# Patient Record
Sex: Female | Born: 1935 | Race: White | Hispanic: No | Marital: Married | Smoking: Former smoker
Health system: Southern US, Community
[De-identification: ages and names within clinical notes are randomized; demographics above are authoritative.]

## PROBLEM LIST (undated history)

## (undated) DIAGNOSIS — I5042 Chronic combined systolic (congestive) and diastolic (congestive) heart failure: Secondary | ICD-10-CM

## (undated) DIAGNOSIS — A419 Sepsis, unspecified organism: Secondary | ICD-10-CM

## (undated) DIAGNOSIS — I10 Essential (primary) hypertension: Secondary | ICD-10-CM

## (undated) DIAGNOSIS — R9439 Abnormal result of other cardiovascular function study: Secondary | ICD-10-CM

## (undated) DIAGNOSIS — J181 Lobar pneumonia, unspecified organism: Secondary | ICD-10-CM

## (undated) DIAGNOSIS — F17200 Nicotine dependence, unspecified, uncomplicated: Secondary | ICD-10-CM

## (undated) DIAGNOSIS — I5041 Acute combined systolic (congestive) and diastolic (congestive) heart failure: Secondary | ICD-10-CM

## (undated) DIAGNOSIS — I456 Pre-excitation syndrome: Secondary | ICD-10-CM

## (undated) DIAGNOSIS — G2581 Restless legs syndrome: Secondary | ICD-10-CM

## (undated) DIAGNOSIS — J449 Chronic obstructive pulmonary disease, unspecified: Secondary | ICD-10-CM

## (undated) HISTORY — PX: APPENDECTOMY: SHX54

## (undated) HISTORY — PX: HEMANGIOMA EXCISION: SHX1734

## (undated) HISTORY — PX: KIDNEY SURGERY: SHX687

## (undated) HISTORY — DX: Abnormal result of other cardiovascular function study: R94.39

## (undated) HISTORY — DX: Lobar pneumonia, unspecified organism: J18.1

## (undated) HISTORY — PX: NECK SURGERY: SHX720

## (undated) HISTORY — PX: TONSILLECTOMY: SUR1361

## (undated) HISTORY — DX: Sepsis, unspecified organism: A41.9

## (undated) HISTORY — DX: Acute combined systolic (congestive) and diastolic (congestive) heart failure: I50.41

## (undated) HISTORY — PX: BACK SURGERY: SHX140

---

## 1997-12-01 ENCOUNTER — Other Ambulatory Visit: Admission: RE | Admit: 1997-12-01 | Discharge: 1997-12-01 | Payer: Self-pay | Admitting: Internal Medicine

## 1997-12-30 ENCOUNTER — Other Ambulatory Visit: Admission: RE | Admit: 1997-12-30 | Discharge: 1997-12-30 | Payer: Self-pay | Admitting: Gynecology

## 1998-04-09 ENCOUNTER — Emergency Department (HOSPITAL_COMMUNITY): Admission: EM | Admit: 1998-04-09 | Discharge: 1998-04-09 | Payer: Self-pay | Admitting: Emergency Medicine

## 1998-07-30 ENCOUNTER — Ambulatory Visit (HOSPITAL_COMMUNITY): Admission: RE | Admit: 1998-07-30 | Discharge: 1998-07-30 | Payer: Self-pay | Admitting: Gastroenterology

## 1999-02-03 ENCOUNTER — Other Ambulatory Visit: Admission: RE | Admit: 1999-02-03 | Discharge: 1999-02-03 | Payer: Self-pay | Admitting: Gynecology

## 1999-07-19 ENCOUNTER — Ambulatory Visit (HOSPITAL_COMMUNITY): Admission: RE | Admit: 1999-07-19 | Discharge: 1999-07-19 | Payer: Self-pay | Admitting: Gastroenterology

## 2000-01-12 ENCOUNTER — Encounter: Payer: Self-pay | Admitting: Internal Medicine

## 2000-01-12 ENCOUNTER — Encounter: Admission: RE | Admit: 2000-01-12 | Discharge: 2000-01-12 | Payer: Self-pay | Admitting: Internal Medicine

## 2000-01-17 ENCOUNTER — Encounter: Admission: RE | Admit: 2000-01-17 | Discharge: 2000-01-17 | Payer: Self-pay | Admitting: Internal Medicine

## 2000-01-17 ENCOUNTER — Encounter: Payer: Self-pay | Admitting: Internal Medicine

## 2000-03-02 ENCOUNTER — Other Ambulatory Visit: Admission: RE | Admit: 2000-03-02 | Discharge: 2000-03-02 | Payer: Self-pay | Admitting: Gynecology

## 2000-03-13 ENCOUNTER — Encounter: Payer: Self-pay | Admitting: Internal Medicine

## 2000-03-13 ENCOUNTER — Encounter: Admission: RE | Admit: 2000-03-13 | Discharge: 2000-03-13 | Payer: Self-pay | Admitting: Internal Medicine

## 2000-12-27 ENCOUNTER — Encounter: Payer: Self-pay | Admitting: Internal Medicine

## 2000-12-27 ENCOUNTER — Ambulatory Visit (HOSPITAL_COMMUNITY): Admission: RE | Admit: 2000-12-27 | Discharge: 2000-12-27 | Payer: Self-pay | Admitting: Internal Medicine

## 2001-01-03 ENCOUNTER — Encounter: Payer: Self-pay | Admitting: Specialist

## 2001-01-03 ENCOUNTER — Ambulatory Visit (HOSPITAL_COMMUNITY): Admission: RE | Admit: 2001-01-03 | Discharge: 2001-01-03 | Payer: Self-pay | Admitting: Specialist

## 2001-02-02 ENCOUNTER — Encounter: Payer: Self-pay | Admitting: Neurosurgery

## 2001-02-06 ENCOUNTER — Encounter: Payer: Self-pay | Admitting: Neurosurgery

## 2001-02-06 ENCOUNTER — Inpatient Hospital Stay (HOSPITAL_COMMUNITY): Admission: RE | Admit: 2001-02-06 | Discharge: 2001-02-09 | Payer: Self-pay | Admitting: Neurosurgery

## 2001-02-21 ENCOUNTER — Encounter: Admission: RE | Admit: 2001-02-21 | Discharge: 2001-02-21 | Payer: Self-pay | Admitting: Neurosurgery

## 2001-02-21 ENCOUNTER — Encounter: Payer: Self-pay | Admitting: Neurosurgery

## 2001-03-06 ENCOUNTER — Encounter: Payer: Self-pay | Admitting: Internal Medicine

## 2001-03-06 ENCOUNTER — Encounter: Admission: RE | Admit: 2001-03-06 | Discharge: 2001-03-06 | Payer: Self-pay | Admitting: Internal Medicine

## 2001-04-04 ENCOUNTER — Encounter: Admission: RE | Admit: 2001-04-04 | Discharge: 2001-04-04 | Payer: Self-pay | Admitting: Neurosurgery

## 2001-04-04 ENCOUNTER — Encounter: Payer: Self-pay | Admitting: Neurosurgery

## 2001-04-05 ENCOUNTER — Other Ambulatory Visit: Admission: RE | Admit: 2001-04-05 | Discharge: 2001-04-05 | Payer: Self-pay | Admitting: Gynecology

## 2001-05-17 ENCOUNTER — Encounter: Payer: Self-pay | Admitting: Neurosurgery

## 2001-05-17 ENCOUNTER — Encounter: Admission: RE | Admit: 2001-05-17 | Discharge: 2001-05-17 | Payer: Self-pay | Admitting: Neurosurgery

## 2001-05-25 ENCOUNTER — Encounter: Payer: Self-pay | Admitting: Emergency Medicine

## 2001-05-25 ENCOUNTER — Emergency Department (HOSPITAL_COMMUNITY): Admission: EM | Admit: 2001-05-25 | Discharge: 2001-05-25 | Payer: Self-pay | Admitting: Emergency Medicine

## 2001-05-31 ENCOUNTER — Encounter: Payer: Self-pay | Admitting: Neurosurgery

## 2001-05-31 ENCOUNTER — Encounter: Admission: RE | Admit: 2001-05-31 | Discharge: 2001-05-31 | Payer: Self-pay | Admitting: Neurosurgery

## 2001-06-14 ENCOUNTER — Encounter: Payer: Self-pay | Admitting: Neurosurgery

## 2001-06-14 ENCOUNTER — Encounter: Admission: RE | Admit: 2001-06-14 | Discharge: 2001-06-14 | Payer: Self-pay | Admitting: Neurosurgery

## 2001-06-24 ENCOUNTER — Emergency Department (HOSPITAL_COMMUNITY): Admission: EM | Admit: 2001-06-24 | Discharge: 2001-06-24 | Payer: Self-pay | Admitting: Emergency Medicine

## 2001-09-10 ENCOUNTER — Ambulatory Visit (HOSPITAL_COMMUNITY): Admission: RE | Admit: 2001-09-10 | Discharge: 2001-09-10 | Payer: Self-pay | Admitting: Neurosurgery

## 2001-09-10 ENCOUNTER — Encounter: Payer: Self-pay | Admitting: Neurosurgery

## 2001-11-07 ENCOUNTER — Inpatient Hospital Stay (HOSPITAL_COMMUNITY): Admission: EM | Admit: 2001-11-07 | Discharge: 2001-11-09 | Payer: Self-pay | Admitting: Specialist

## 2001-11-19 ENCOUNTER — Encounter (INDEPENDENT_AMBULATORY_CARE_PROVIDER_SITE_OTHER): Payer: Self-pay | Admitting: Specialist

## 2001-11-19 ENCOUNTER — Emergency Department (HOSPITAL_COMMUNITY): Admission: EM | Admit: 2001-11-19 | Discharge: 2001-11-19 | Payer: Self-pay | Admitting: Emergency Medicine

## 2002-03-08 ENCOUNTER — Encounter: Admission: RE | Admit: 2002-03-08 | Discharge: 2002-03-08 | Payer: Self-pay | Admitting: Gynecology

## 2002-03-08 ENCOUNTER — Encounter: Payer: Self-pay | Admitting: Gynecology

## 2002-04-10 ENCOUNTER — Other Ambulatory Visit: Admission: RE | Admit: 2002-04-10 | Discharge: 2002-04-10 | Payer: Self-pay | Admitting: Gynecology

## 2002-08-06 ENCOUNTER — Encounter: Admission: RE | Admit: 2002-08-06 | Discharge: 2002-08-06 | Payer: Self-pay | Admitting: Internal Medicine

## 2002-08-06 ENCOUNTER — Encounter: Payer: Self-pay | Admitting: Internal Medicine

## 2002-08-14 ENCOUNTER — Encounter: Admission: RE | Admit: 2002-08-14 | Discharge: 2002-08-14 | Payer: Self-pay | Admitting: Neurosurgery

## 2002-08-14 ENCOUNTER — Encounter: Payer: Self-pay | Admitting: Neurosurgery

## 2002-11-06 ENCOUNTER — Encounter: Payer: Self-pay | Admitting: Neurosurgery

## 2002-11-06 ENCOUNTER — Ambulatory Visit (HOSPITAL_COMMUNITY): Admission: RE | Admit: 2002-11-06 | Discharge: 2002-11-06 | Payer: Self-pay | Admitting: Neurosurgery

## 2003-03-27 ENCOUNTER — Encounter: Payer: Self-pay | Admitting: Gynecology

## 2003-03-27 ENCOUNTER — Encounter: Admission: RE | Admit: 2003-03-27 | Discharge: 2003-03-27 | Payer: Self-pay | Admitting: Gynecology

## 2003-04-21 ENCOUNTER — Encounter: Admission: RE | Admit: 2003-04-21 | Discharge: 2003-04-21 | Payer: Self-pay | Admitting: Specialist

## 2003-04-21 ENCOUNTER — Encounter: Payer: Self-pay | Admitting: Radiology

## 2003-04-21 ENCOUNTER — Encounter: Payer: Self-pay | Admitting: Specialist

## 2003-05-09 ENCOUNTER — Encounter: Admission: RE | Admit: 2003-05-09 | Discharge: 2003-05-09 | Payer: Self-pay | Admitting: Specialist

## 2003-05-09 ENCOUNTER — Encounter: Payer: Self-pay | Admitting: Specialist

## 2003-06-03 ENCOUNTER — Encounter: Payer: Self-pay | Admitting: Specialist

## 2003-06-03 ENCOUNTER — Ambulatory Visit (HOSPITAL_COMMUNITY): Admission: RE | Admit: 2003-06-03 | Discharge: 2003-06-03 | Payer: Self-pay | Admitting: Specialist

## 2003-06-13 ENCOUNTER — Encounter: Payer: Self-pay | Admitting: Specialist

## 2003-06-13 ENCOUNTER — Encounter: Admission: RE | Admit: 2003-06-13 | Discharge: 2003-06-13 | Payer: Self-pay | Admitting: Specialist

## 2003-07-11 ENCOUNTER — Inpatient Hospital Stay (HOSPITAL_COMMUNITY): Admission: RE | Admit: 2003-07-11 | Discharge: 2003-07-13 | Payer: Self-pay | Admitting: Specialist

## 2004-04-02 ENCOUNTER — Encounter: Admission: RE | Admit: 2004-04-02 | Discharge: 2004-04-02 | Payer: Self-pay | Admitting: Specialist

## 2004-04-08 ENCOUNTER — Encounter: Admission: RE | Admit: 2004-04-08 | Discharge: 2004-04-08 | Payer: Self-pay | Admitting: Gynecology

## 2004-04-19 ENCOUNTER — Other Ambulatory Visit: Admission: RE | Admit: 2004-04-19 | Discharge: 2004-04-19 | Payer: Self-pay | Admitting: Gynecology

## 2004-04-29 ENCOUNTER — Encounter: Admission: RE | Admit: 2004-04-29 | Discharge: 2004-04-29 | Payer: Self-pay | Admitting: Specialist

## 2004-05-17 ENCOUNTER — Encounter: Admission: RE | Admit: 2004-05-17 | Discharge: 2004-05-17 | Payer: Self-pay | Admitting: Specialist

## 2004-05-31 ENCOUNTER — Encounter: Admission: RE | Admit: 2004-05-31 | Discharge: 2004-05-31 | Payer: Self-pay | Admitting: Specialist

## 2004-09-30 ENCOUNTER — Inpatient Hospital Stay (HOSPITAL_COMMUNITY): Admission: RE | Admit: 2004-09-30 | Discharge: 2004-10-01 | Payer: Self-pay | Admitting: Specialist

## 2004-10-26 ENCOUNTER — Emergency Department (HOSPITAL_COMMUNITY): Admission: EM | Admit: 2004-10-26 | Discharge: 2004-10-26 | Payer: Self-pay | Admitting: Emergency Medicine

## 2005-01-31 ENCOUNTER — Encounter: Admission: RE | Admit: 2005-01-31 | Discharge: 2005-01-31 | Payer: Self-pay | Admitting: Gastroenterology

## 2005-04-13 ENCOUNTER — Encounter: Admission: RE | Admit: 2005-04-13 | Discharge: 2005-04-13 | Payer: Self-pay | Admitting: Gynecology

## 2005-05-06 ENCOUNTER — Ambulatory Visit: Payer: Self-pay | Admitting: Pain Medicine

## 2005-05-13 ENCOUNTER — Ambulatory Visit: Payer: Self-pay | Admitting: Pain Medicine

## 2005-06-01 ENCOUNTER — Ambulatory Visit: Payer: Self-pay | Admitting: Pain Medicine

## 2005-06-07 ENCOUNTER — Ambulatory Visit: Payer: Self-pay | Admitting: Pain Medicine

## 2005-06-20 ENCOUNTER — Ambulatory Visit: Payer: Self-pay | Admitting: Physician Assistant

## 2005-06-28 ENCOUNTER — Ambulatory Visit: Payer: Self-pay | Admitting: Pain Medicine

## 2005-07-12 ENCOUNTER — Ambulatory Visit: Payer: Self-pay | Admitting: Physician Assistant

## 2005-08-03 ENCOUNTER — Ambulatory Visit: Payer: Self-pay | Admitting: Pain Medicine

## 2005-08-31 ENCOUNTER — Ambulatory Visit: Payer: Self-pay | Admitting: Pain Medicine

## 2005-09-02 ENCOUNTER — Ambulatory Visit: Payer: Self-pay | Admitting: Pain Medicine

## 2005-10-17 ENCOUNTER — Ambulatory Visit: Payer: Self-pay | Admitting: Pain Medicine

## 2005-10-17 ENCOUNTER — Inpatient Hospital Stay: Payer: Self-pay | Admitting: Pain Medicine

## 2005-11-07 ENCOUNTER — Ambulatory Visit: Payer: Self-pay | Admitting: Pain Medicine

## 2005-12-07 ENCOUNTER — Ambulatory Visit: Payer: Self-pay | Admitting: Pain Medicine

## 2006-01-04 ENCOUNTER — Ambulatory Visit: Payer: Self-pay | Admitting: Pain Medicine

## 2006-01-25 ENCOUNTER — Ambulatory Visit: Payer: Self-pay | Admitting: Pain Medicine

## 2006-01-31 ENCOUNTER — Ambulatory Visit: Payer: Self-pay | Admitting: Pain Medicine

## 2006-02-15 ENCOUNTER — Ambulatory Visit: Payer: Self-pay | Admitting: Physician Assistant

## 2006-03-30 ENCOUNTER — Ambulatory Visit: Payer: Self-pay | Admitting: Physician Assistant

## 2006-04-06 ENCOUNTER — Ambulatory Visit: Payer: Self-pay | Admitting: Pain Medicine

## 2006-04-24 ENCOUNTER — Ambulatory Visit: Payer: Self-pay | Admitting: Pain Medicine

## 2006-05-08 ENCOUNTER — Ambulatory Visit: Payer: Self-pay | Admitting: Pain Medicine

## 2006-05-10 ENCOUNTER — Encounter: Admission: RE | Admit: 2006-05-10 | Discharge: 2006-05-10 | Payer: Self-pay | Admitting: Gynecology

## 2006-06-05 ENCOUNTER — Ambulatory Visit: Payer: Self-pay | Admitting: Physician Assistant

## 2006-07-13 ENCOUNTER — Other Ambulatory Visit: Admission: RE | Admit: 2006-07-13 | Discharge: 2006-07-13 | Payer: Self-pay | Admitting: Gynecology

## 2006-08-16 ENCOUNTER — Ambulatory Visit: Payer: Self-pay | Admitting: Pain Medicine

## 2006-09-07 ENCOUNTER — Ambulatory Visit: Payer: Self-pay | Admitting: Pain Medicine

## 2006-09-20 ENCOUNTER — Ambulatory Visit: Payer: Self-pay | Admitting: Physician Assistant

## 2006-10-09 ENCOUNTER — Ambulatory Visit: Payer: Self-pay | Admitting: Pain Medicine

## 2006-10-24 ENCOUNTER — Ambulatory Visit: Payer: Self-pay | Admitting: Pain Medicine

## 2006-11-20 ENCOUNTER — Ambulatory Visit: Payer: Self-pay | Admitting: Physician Assistant

## 2006-11-30 ENCOUNTER — Ambulatory Visit: Payer: Self-pay | Admitting: Pain Medicine

## 2006-12-14 ENCOUNTER — Ambulatory Visit: Payer: Self-pay | Admitting: Physician Assistant

## 2007-01-09 ENCOUNTER — Ambulatory Visit: Payer: Self-pay | Admitting: Physician Assistant

## 2007-01-11 ENCOUNTER — Ambulatory Visit: Payer: Self-pay | Admitting: Pain Medicine

## 2007-01-25 ENCOUNTER — Ambulatory Visit: Payer: Self-pay | Admitting: Physician Assistant

## 2007-02-01 ENCOUNTER — Ambulatory Visit: Payer: Self-pay | Admitting: Pain Medicine

## 2007-02-14 ENCOUNTER — Encounter: Admission: RE | Admit: 2007-02-14 | Discharge: 2007-02-14 | Payer: Self-pay | Admitting: Internal Medicine

## 2007-02-14 ENCOUNTER — Encounter: Admission: RE | Admit: 2007-02-14 | Discharge: 2007-02-14 | Payer: Self-pay | Admitting: Neurosurgery

## 2007-03-06 ENCOUNTER — Ambulatory Visit: Payer: Self-pay | Admitting: Physician Assistant

## 2007-04-03 ENCOUNTER — Ambulatory Visit: Payer: Self-pay | Admitting: Physician Assistant

## 2007-04-13 ENCOUNTER — Ambulatory Visit: Payer: Self-pay | Admitting: Physician Assistant

## 2007-04-19 ENCOUNTER — Ambulatory Visit: Payer: Self-pay | Admitting: Pain Medicine

## 2007-05-03 ENCOUNTER — Ambulatory Visit: Payer: Self-pay | Admitting: Physician Assistant

## 2007-05-16 ENCOUNTER — Ambulatory Visit: Payer: Self-pay | Admitting: Physician Assistant

## 2007-06-14 ENCOUNTER — Encounter: Admission: RE | Admit: 2007-06-14 | Discharge: 2007-06-14 | Payer: Self-pay | Admitting: Gynecology

## 2007-06-28 ENCOUNTER — Ambulatory Visit: Payer: Self-pay | Admitting: Physician Assistant

## 2007-07-18 ENCOUNTER — Ambulatory Visit: Payer: Self-pay | Admitting: Pain Medicine

## 2007-09-19 ENCOUNTER — Ambulatory Visit (HOSPITAL_COMMUNITY): Admission: RE | Admit: 2007-09-19 | Discharge: 2007-09-20 | Payer: Self-pay | Admitting: Orthopedic Surgery

## 2007-12-06 ENCOUNTER — Encounter: Admission: RE | Admit: 2007-12-06 | Discharge: 2007-12-06 | Payer: Self-pay | Admitting: Gastroenterology

## 2008-05-06 ENCOUNTER — Encounter: Admission: RE | Admit: 2008-05-06 | Discharge: 2008-05-06 | Payer: Self-pay | Admitting: Gastroenterology

## 2008-06-18 ENCOUNTER — Encounter: Admission: RE | Admit: 2008-06-18 | Discharge: 2008-06-18 | Payer: Self-pay | Admitting: Gynecology

## 2008-07-16 ENCOUNTER — Ambulatory Visit (HOSPITAL_COMMUNITY): Admission: RE | Admit: 2008-07-16 | Discharge: 2008-07-17 | Payer: Self-pay | Admitting: Orthopedic Surgery

## 2009-04-23 ENCOUNTER — Encounter: Admission: RE | Admit: 2009-04-23 | Discharge: 2009-04-23 | Payer: Self-pay | Admitting: Internal Medicine

## 2009-05-20 ENCOUNTER — Emergency Department (HOSPITAL_COMMUNITY): Admission: EM | Admit: 2009-05-20 | Discharge: 2009-05-20 | Payer: Self-pay | Admitting: Emergency Medicine

## 2009-07-03 ENCOUNTER — Encounter: Admission: RE | Admit: 2009-07-03 | Discharge: 2009-07-03 | Payer: Self-pay | Admitting: Gynecology

## 2009-07-14 ENCOUNTER — Encounter: Admission: RE | Admit: 2009-07-14 | Discharge: 2009-07-14 | Payer: Self-pay | Admitting: Gynecology

## 2009-09-14 ENCOUNTER — Encounter: Admission: RE | Admit: 2009-09-14 | Discharge: 2009-09-14 | Payer: Self-pay | Admitting: Gastroenterology

## 2010-05-18 ENCOUNTER — Encounter: Admission: RE | Admit: 2010-05-18 | Discharge: 2010-05-18 | Payer: Self-pay | Admitting: Internal Medicine

## 2010-07-07 ENCOUNTER — Encounter: Admission: RE | Admit: 2010-07-07 | Discharge: 2010-07-07 | Payer: Self-pay | Admitting: Gynecology

## 2010-07-13 ENCOUNTER — Encounter: Admission: RE | Admit: 2010-07-13 | Discharge: 2010-07-13 | Payer: Self-pay | Admitting: Neurology

## 2010-09-19 ENCOUNTER — Encounter: Payer: Self-pay | Admitting: Gynecology

## 2011-01-11 NOTE — Discharge Summary (Signed)
NAMEDESHANTA, [name]                ACCOUNT NO.:  [id number]   MEDICAL RECORD NO.:  [id number]          PATIENT TYPE:  OIB   LOCATION:  5008                         FACILITY:  [facility]   PHYSICIAN:  Alvy Beal, MD    DATE OF BIRTH:  05/06/1929   DATE OF ADMISSION:  09/19/2007  DATE OF DISCHARGE:  09/20/2007                               DISCHARGE SUMMARY   ADMISSION DIAGNOSES:  1. Lumbar degenerative disc disease.  2. Chronic low back pain.   DISCHARGE DIAGNOSES:  1. Lumbar degenerative disc disease.  2. Chronic low back pain.   SERVICE:  Dr. Shon Baton, [facility].   PROCEDURE:  Spinal cord stimulator placement.  This is an outpatient  procedure with extended recovery.   HISTORY:  [name] is a very pleasant 77 year old [name] who has been  having significant back pain and bilateral lower extremity.  [name] has  been treated in our office by Dr. Ethelene Hal for chronic pain and as a result  had a spinal cord stimulator trial which vastly improved her pain  approximately 75%.  Based on this improvement, [name] opted to have a  spinal cord stimulator placed.   PAST MEDICAL HISTORY:  1. Restless leg syndrome.  2. Lumbar degenerative disc disease.  3. Hypertension.  4. COPD.  5. Chronic pain syndrome.  6. Gastroesophageal reflux.  7. Anxiety.  8. SEASONAL ALLERGIES.  9. Hyperlipidemia.  10.Irritable bowel syndrome.   PHYSICAL EXAM IN THE OFFICE:  [name] is a very pleasant [name] who appears  younger than her stated age.  [name] is alert and oriented x3.  [name] had no  localizing motor or sensory deficit.  [name] had significant back pain with  palpitation and range of motion, no isolated hip, knee or ankle pain  with joint range of motion.  [name] did not have a foot drop and [name] had no  neurological deficits.   HOSPITAL COURSE:  Patient arrived at the hospital, was consented to the  procedure of the spinal cord stimulator placement.  Risk and benefits of  the procedure were again gone  over with the patient and consent was  obtained.  The patient was taken back to the operating room. [name]  tolerated the procedure very well, was transferred from the operating  room to the PACU in stable condition and was transferred from the PACU  to the Ortho Floor again in stable condition.  Patient had no  complications during her stay and the morning after surgery [name] was  ambulating on her own, [name] was voiding on her own and [name] was tolerating  a regular diet.  [name] is being discharged home today in stable condition.   DISCHARGE MEDICATIONS:  Include the medications on her Med Rec Form  which was:  1. Hydrochlorothiazide/triamterene.  2. Protonix.  3. Mirapex.  4. Sinemet.  5. Vytorin.  6. Ativan.  7. MiraLax.  8. Estradiol.  9. Vitamin D.  10.Citracal.  11.[name] is being discharged on a new medication of Percocet 10/325.  12.[name] is to discontinue her home medication of Percocet 5/325.  13.[name] is also given a  prescription for Robaxin 500 mg.   DISCHARGE INSTRUCTIONS:  1. Activity as tolerated.  2. Patient was allowed to shower postoperatively day 4.  [name] is to      avoid any bath or soaks.  3. [name] can apply ice to the incision sites for pain relief.  4. [name] is to follow up in our office in approximately 7-10 days for a      suture removal and wound check and at that time we will have her      spinal cord stimulator turned on and adjusted.      Crissie Reese, PA      Alvy Beal, MD  Electronically Signed    AC/MEDQ  D:  09/20/2007  T:  09/20/2007  Job:  [id number]

## 2011-01-11 NOTE — Op Note (Signed)
[name], [name]                ACCOUNT NO.:  [id number]   MEDICAL RECORD NO.:  [id number]          PATIENT TYPE:  OIB   LOCATION:  5014                         FACILITY:  [facility]   PHYSICIAN:  Alvy Beal, MD    DATE OF BIRTH:  11-May-1929   DATE OF PROCEDURE:  DATE OF DISCHARGE:                               OPERATIVE REPORT   Operative report  Pre-Op diagnosis: symptomatic hardware  Post-operative DX: same  Complications: none  EBL: minimum  HX:  She presents because of symptomatic hardware.  Several months ago  she had a spinal cord stimulator placed for chronic lower extremity  pain.  She did well initially with a spinal cord stimulator, stop being  effective.  She was still having significant pain at the battery site.  It was irritating her whenever she wear clothes and causing her  significant pain.  As a result she presented to have the battery  removed.  After discussing all appropriate risks, benefits, and  alternatives to surgery, she consented for removal of the symptomatic  hardware.   OPERATIVE NOTE:  The patient was brought to the operating room, placed  supine on the operating table.  After successful induction of general  anesthesia and endotracheal intubation, TEDs and SCDs were applied.  She  was turned prone onto a Wilson frame.  All bony prominences well-padded  and the back was prepped and draped in standard fashion.  A 0.25%  Sensorcaine mixed with epinephrine was used to anesthetize the skin.  An  incision was made through the previous incision where the battery was  placed.  Sharp dissection was carried out down to the battery.  It was  then delivered through the wound onto the skin.  The leads were drawn  somewhat tight and then cut.  The wound was then irrigated copiously  with normal saline.  The deep subcutaneous tissue was closed with  interrupted 2-0 Vicryl sutures and the skin with 3-0 Monocryl.  Steri-  Strips, dry dressing were applied.  The  battery was cleaned from the  patient to have later on.  At the end of the case, all needle and sponge  counts were correct.  The patient was ultimately transferred to PACU  without incident.  At the end of the case all needle and spomgue counts were correct.      Alvy Beal, MD  Electronically Signed     DDB/MEDQ  D:  07/16/2008  T:  07/17/2008  Job:  [id number]

## 2011-01-11 NOTE — Op Note (Signed)
NAMEKATRINA, [name]                ACCOUNT NO.:  [id number]   MEDICAL RECORD NO.:  [id number]          PATIENT TYPE:  OIB   LOCATION:  2550                         FACILITY:  [facility]   PHYSICIAN:  Alvy Beal, MD    DATE OF BIRTH:  Jan 27, 1929   DATE OF PROCEDURE:  09/19/2007  DATE OF DISCHARGE:                               OPERATIVE REPORT   PREOPERATIVE DIAGNOSIS:  Chronic back pain, status post back surgery.   POSTOPERATIVE DIAGNOSIS:  Chronic back pain, status post back surgery.   OPERATIVE PROCEDURE:  Spinal cord stimulator implantation.   COMPLICATIONS:  None.   CONDITION:  Stable.   HISTORY:  The patient is a very pleasant 77 year old woman who presents  with significant low back pain after having a decompressive procedure in  the past.  She had trial stimulator placed by my partner Dr. Ethelene Hal and  did very well.  Because of the improved pain and overall function, she  elected to proceed with permanent implantation.  All appropriate risks,  benefits, and alternatives to surgery were discussed and consent was  obtained.   FIRST ASSISTANT:  Crissie Reese, PA.   OPERATIVE NOTE:  The patient was brought to the operating room and  placed supine on the operating table.  After successful induction of  general anesthesia and endotracheal intubation, TED hose and SCDs were  applied.  The patient was turned prone onto a Wilson frame and the arms  were placed overhead.  All bony prominences were well-padded and the  back was prepped and draped in the standard fashion.   Using lateral fluoroscopy, I was then able to count from L5 up to T10.  A midline thoracic incision was made to expose a portion of T9, all of  T10, and a portion of T11 spinous processes.  Sharp dissection was  carried out down through the deep fascia and then a Cobb elevator was  used to strip the paraspinal muscles off the spinous processes and  laminae.  This was done bilaterally.  Once the posterior  spine was  exposed, I then used lateral fluoroscopy to again confirm the T10  spinous process.  Once confirmed, I resected with a Leksell rongeur and  then used a 3 mm Kerrison to perform a generous laminotomy of T10.  Using a fine curette, we developed a plane underneath the ligamentum  flavum and resected the ligamentum flavum to expose the underlying  thecal sac.  At this point with the anterior thecal sac well-exposed, I  then used a dural elevator in order to palpate up underneath the T9  spinous process.  This was confirmed.  Once I had an adequate plane, I  took the permanent implant and advanced it.  It went without tension or  pressure at this point confirmed in the AP and lateral planes that it  was properly positioned with fluoroscopy.  Once confirmed, I then  stitched it directly to bone with a FiberWire stitch and then intercross  wove through the T10-T11 interspinous process ligament.  At this point,  I had adequate stabilization of the paddle to  prevent migration.  I then  tested the battery to ensure it was working correctly.  I then made a  second counter incision on the left flank at the left buttock region and  created a pocket approximately 2.5 cm in depth from the skin surface.  I  then used a trocar and advanced it from the thoracic site to the lower  buttock incision.  I then advanced the leads through this and connected  it to the battery and buried the battery in the pocket and stitched it  with 2-0 Ethibond.  I then irrigated all the wounds and closed the deep  fascia on both wounds with interrupted #1 Vicryl  sutures, superficial with 2-0 Vicryl sutures, and 3-0 Monocryl for the  skin.  Steri-Strips and a dry dressing were applied.  The patient was  extubated and transferred to the [facility] unit without incident.  At the end  of the case, all needle, instrument, and sponge counts were correct.      Alvy Beal, MD  Electronically Signed     DDB/MEDQ  D:   09/19/2007  T:  09/19/2007  Job:  [id number]

## 2011-01-14 NOTE — Op Note (Signed)
[place]  Patient:    [name], [name]                         MRN: [id number] Proc. Date: 02/06/01 Attending:  Payton Doughty, M.D.                           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  OPERATION:  C4-5, C5-6, and C6-7 anterior cervical diskectomy and fusion with a tether plate.  SURGEON:   Payton Doughty, M.D.  ASSISTANT:  Mena Goes. Franky Macho, M.D.  SERVICE:  Neurosurgery.  ANESTHESIA:  General endotracheal anesthesia.  PREP:    In sterile manner and scrubbed with alcohol wipe.  COMPLICATIONS:  None.  INDICATIONS:  A 77 year old right-handed white lady with severe spondylitic myelopathy at 4-5, 5-6, and 6-7.  She has a segmentation failure at 3-4.  DESCRIPTION OF PROCEDURE:  She was taken to the operating suite, intubated, and placed supine on the operating table.  Following shave, prep, and drape in the usual sterile fashion, skin was incised from two fingerbreadths above the carotid tubercle to three fingerbreadths below the carotid tubercle paralleling the sternocleidomastoid muscle on one side.  The platysma was identified, elevated, divided, and undermined.  Sternocleidomastoid was identified, and medial dissection revealed the carotid artery, trachea, and esophagus.  The carotid was retracted laterally to the left.  The left trachea and esophagus were retracted laterally to the right, exposing the bones of the anterior cervical spine.  Marker was placed.  Intraoperative x-ray taken to confirm correctness of level.  The marker was at 5-6.  The longus coli were taken down bilaterally over the 4, 5, 6, and 7 vertebral bodies.  A Shadow-Line self-retaining retractor placed transversely in cephalocaudal direction as well.  The 4-5, 5-6, and 6-7 disks were removed, first under gross observation,  then the operating microscope was brought in.  Under the operating microscope at 4-5, there was posterior projection osteophytes, disk eccentric to the left side interfering with the left C5 root.  At 5-6, there was a larger posterior projecting osteophyte with cord compression, and this confirmed what was found on the MRI.  At 6-7, there was biforaminal narrowing and uncovertebral hypertrophy with compression of the neural foremen bilaterally as well as a central osteophyte which was removed without difficulty.  Following complete decompression of the nerve roots in the anterior epidural space, 7 mm bone grafts were fashioned from patellar autograft and tapped into place.  A 55 mm tether plate was then placed with 12 mm screws, two in 4, one in 5, one in 6, and two in 7.  Intraoperative x-ray showed good placement of bone grafts, plate, and screws.  The wound was irrigated and hemostasis assured.  The platysma was reapproximated with 3-0 Vicryl in interrupted fashion, subcutaneous tissue was reapproximated with 3-0 Vicryl in interrupted fashion.  The skin was closed with 4-0 Vicryl in running subcuticular fashion.  Benzoin and Steri-Strips were placed and made inclusive with Telfa and OpSite.  The patient returned to the recovery room in good condition.DD:  02/06/01 TD:  02/06/01 Job: [id number]

## 2011-01-14 NOTE — Procedures (Signed)
[place]  Patient:    [name], [name] Visit Number: [id number] MRN: [id number]          Service Type: EMS Location: [place] Attending Physician:  Doug Sou Dictated by:   Florencia Reasons, M.D. Proc. Date: 11/19/01 Admit Date:  11/19/2001   CC:         Crista Luria, M.D.   Procedure Report  PROCEDURE:  Colonoscopy with biopsies.  INDICATIONS FOR PROCEDURE:  The patient is a 77 year old female with bloody diarrhea of approximately 36 hours duration which began after eating fried chicken at a local restaurant, and was preceded by recurrent non-bloody emesis.  FINDINGS:  Findings compatible with ischemic colitis involving the left colon.  DESCRIPTION OF PROCEDURE:   The nature, purpose, and risks of colonoscopy were familiar to the patient from prior examination (had negative colonoscopy by me three years ago), and she provided written consent.  Sedation prior to and during the course of this exam totaled phentanyl 100 mcg and Versed 11 or 12 mg IV without arrhythmias or desaturation.  The Olympus adjustable tension pediatric video colonoscope was inserted in the patient after she was brought from the emergency room to the endoscopy unit. No prep had been administered, yet the quality of the prep was excellent, consistent with the patients history of protracted diarrhea.  The scope was advanced to what I believe was the hepatic flexure, where upon pullback was initiated since the patient was having abdominal discomfort with further passage.  The main finding on this exam, and indeed the only abnormality, was the presence of exudative colitis in a confluent fashion from roughly 15 to 44 cm, with complete sparing of the rectum, and with some patchy inflammatory changes at either end of the inflammatory segment, but with circumferential inflammation in between.  There was no evidence of frank gangrene or "blebs," or any dark tissue.   Rather, things looked rather severely inflamed with exudate.  No polyps, cancer, vascular malformations, or diverticular disease were observed.  Biopsied were obtained from the inflamed segment.  Retroflexion was not performed in the rectum.  The rectum and the transverse colon looked normal.  The patient tolerated the procedure quite well, and there were no apparent complications.  IMPRESSION:  Left-sided ischemic colitis.  PLAN:  The patient feels up to being treated as an outpatient, and I am comfortable with doing so as long as she has close followup with [place] in the office. Dictated by:   Florencia Reasons, M.D. Attending Physician:  Doug Sou DD:  11/19/01 TD:  11/20/01 Job: [id number]

## 2011-01-14 NOTE — H&P (Signed)
NAME:  [name], [name]                          ACCOUNT NO.:  [id number]   MEDICAL RECORD NO.:  [id number]                   PATIENT TYPE:  INP   LOCATION:  NA                                   [facility]ACILITY[facility]  [facility]   PHYSICIAN:  Jene Every, M.D.                 DATE OF BIRTH:  1929-01-26   DATE OF ADMISSION:  07/11/2003  DATE OF DISCHARGE:                                HISTORY & PHYSICAL   CHIEF COMPLAINT:  Left lower extremity pain.   HISTORY:  Ms. Fross is a 77 year old female who has noted a long-standing  history of low back pain.  However, she has noticed change in her symptoms  with pain extending to the left lower extremity.  The patient initially  presented to our office in September requesting an ESI to this area which  has given her good relief in the past.  The patient was initially treated  conservatively with medications.  However, she continued to be symptomatic  with pain extending to the left lower extremity and inner aspect of her  calf.  The patient did have an ESI at the L5-S1 level that was little to no  relief.  MRI did show a broad based disk herniation central and to the left  with foraminal extension.  Left L4 and L5 nerve root encroachment was noted.  Repeat ESI was done, however, at the L4-5 level.  The patient was returned  and this was not efficacious.  She continued to have pain down the left leg.  She states the [facility] did help for a short period of time and noted a return of  her symptoms.  Examination does show diminished quadriceps strength and  reflexes on the left and a slight amount of quadriceps atrophy is noted.  EHL is 5/5.  Due to the fact the patient has failed conservative treatment  and she continues to have disabling left lower extremity pain, it is felt  that she would benefit from a microdiskectomy and lateral recess  decompression at the L4-5 level.  The risks and benefits of the surgery were  discussed with the patient in detail and she  wishes to proceed.  Medical  clearance was obtained from Sharlet Salina, M.D. to proceed with the  surgery.   PAST MEDICAL HISTORY:  1. Irritable bowel syndrome.  2. Osteoporosis.  3. Osteoarthritis.  4. Hyperlipidemia.  5. Bilateral carpal tunnel syndrome.  6. Gastroesophageal reflux disease.  7. Wolff-Parkinson-White syndrome.   CURRENT MEDICATIONS:  1. Requip 0.05 mg one p.o. daily.  2. Sinemet 25-100 b.i.d.-t.i.d.  3. Protonix 40 mg two p.o. daily.  4. Xanax 0.5 mg one p.o. q.6h. p.r.n.  5. HCTZ 25 mg one p.o. daily.  6. Red yeast rice four daily.  7. Mobic 7.5 mg one p.o. daily.  8. Allegra 180 mg half p.o. daily.  9. Percocet 5/325 one to two p.o. q.4-6h.  p.r.n. pain.  10.      Vicodin 7.5/750 one to two p.o. q.4-6h.  She does alternate between     Percocet and Vicodin.  11.      Centrum Silver.  12.      Calcium.  13.      MiraLax.  14.      Herbal laxative.   PAST SURGICAL HISTORY:  1. Tonsillectomy.  2. Hemangioma removed.  3. Hemorrhoidectomy.  4. Appendectomy.  5. Fibroid cyst removal.  6. Kidney surgery.  7. EP ablation.  8. Sinus surgery.  9. Cervical fusion.  10.      Rotator cuff repair on the right.   ALLERGIES:  INDERAL, AMOXICILLIN, ERYTHROMYCIN, LORABID.   SOCIAL HISTORY:  The patient is married.  She does not drink alcohol.  She  smokes a half a pack of cigarettes per day.   FAMILY HISTORY:  Father deceased of heart attack.  Mother deceased of colon  and liver cancer.  Brother has history of Hodgkin's.  Sisters have a history  of breast and stomach cancer as well as CVA.   REVIEW OF SYSTEMS:  GENERAL:  The patient denies any fever, chills, night  sweats, or bleeding tendencies.  CNS:  No blurred or double vision, seizure,  headache, or paralysis.  RESPIRATORY:  No shortness of breath, productive  cough, or hemoptysis.  CARDIOVASCULAR:  No chest pain, angina, orthopnea.  GENITOURINARY:  No dysuria, hematuria, discharge.  GASTROINTESTINAL:   No  nausea, vomiting, diarrhea, constipation, melena, bloody stools.  MUSCULOSKELETAL:  Pertinent to HPI.   PHYSICAL EXAMINATION:  VITAL SIGNS:  Pulse 80, respiratory 12, blood  pressure 126/84.  GENERAL:  This is a well-developed, well-nourished 77 year old female in  mild distress.  She does walk with an antalgic gait using a cane.  HEENT:  Atraumatic, normocephalic.  Pupils are equal, round, and reactive to  light.  EOMs intact.  NECK:  Supple.  No lymphadenopathy.  There is a noted an anterior cervical  fusion scar.  CHEST:  Clear to auscultation bilaterally.  No rhonchi, wheezes, or rales  are noted.  BREAST:  Not examined.  Not pertinent to HPI.  GENITOURINARY:  Not examined.  Not pertinent to HPI.  HEART:  Regular rate and rhythm without murmurs, rubs, or gallops.  ABDOMEN:  Soft, nontender, nondistended.  Bowel sounds x4.  SKIN:  No rashes or lesions are noted.  EXTREMITIES:  The patient has positive straight leg raise on the left with  decreased quadriceps strength as well as some atrophy with diminished knee  reflexes.  EHL is 5/-5.   IMPRESSION:  1. Herniated nucleus pulposus L4-5 with spinal stenosis.  2. Irritable bowel syndrome.  3. Osteoporosis.  4. Osteoarthritis.  5. Hyperlipidemia.  6. Carpal tunnel syndrome bilaterally.  7. Gastroesophageal reflux disease.  8. Wolff-Parkinson-White syndrome.   PLAN:  The patient will be admitted to [facility] to undergo a  microdiskectomy and lateral recess decompression at L4-5.     Roma Schanz, P.A.                   Jene Every, M.D.    CS/MEDQ  D:  07/09/2003  T:  07/09/2003  Job:  [id number]

## 2011-01-14 NOTE — Discharge Summary (Signed)
[facility]  Patient:    [name], [name] Visit Number: [id number] MRN: [id number]          Service Type: SUR Location: 4W [id number] [id number] Attending Physician:  Pierce Crane Dictated by:   Ottie Glazier. Wynona Neat, P.A.-C. Admit Date:  11/07/2001 Discharge Date: 11/09/2001                             Discharge Summary  NO DICTATION Dictated by:   Ottie Glazier. Wynona Neat, P.A.-C. Attending Physician:  Pierce Crane DD:  11/14/01 TD:  11/16/01 Job: [id number]

## 2011-01-14 NOTE — Discharge Summary (Signed)
[name], [name]                          ACCOUNT NO.:  [id number]   MEDICAL RECORD NO.:  [id number]                   PATIENT TYPE:  INP   LOCATION:  0468                                 FACILITY:  [facility]   PHYSICIAN:  Jene Every, M.D.                 DATE OF BIRTH:  [birth year]   DATE OF ADMISSION:  07/11/2003  DATE OF DISCHARGE:  07/13/2003                                 DISCHARGE SUMMARY   ADMISSION DIAGNOSES:  1. Left lower extremity pain with herniated nucleus pulposus at L4-5, as     well as spinal stenosis.  2. Irritable bowel syndrome.  3. Osteoporosis.  4. Osteoarthritis.  5. Hyperlipidemia.  6. Bilateral carpal tunnel syndrome.  7. Gastroesophageal reflux disease, resolved.  8. Wolff-Parkinson-White syndrome.   DISCHARGE DIAGNOSES:  1. Herniated nucleus pulposus L4-5, status post lateral recess decompression     and microdiskectomy at the L4-5 level.  2. Irritable bowel syndrome.  3. Osteoporosis.  4. Osteoarthritis.  5. Hyperlipidemia.  6. Bilateral carpal tunnel syndrome.  7. Gastroesophageal reflux disease, resolved.  8. Wolff-Parkinson-White syndrome.   HISTORY:  Ms. Smyth is a 77 year old female with a long-standing history of  back pain, however, she did notice a change in her symptoms with pain  extending to the left lower extremity.  The patient initially underwent  epidural steroid injection and was treated with analgesics, however, she did  not notice any significant improvement in her pain.  MRI was obtained which  did show a broad based disk herniation central and to the left with  foraminal extension encroaching on the L4-5 nerve root.  A repeat ESI was  done at the L4-5 level which helped her for a short period of time, however,  the patient noticed return of her symptoms.  On exam, she does have  diminished quadriceps strength as well as reflexes on the left with some  quadriceps atrophy.  Due to the fact that the patient has failed  conservative treatment, she continues to have disabling left lower extremity  pain, it is felt she would benefit from a lateral recess decompression and  microdiskectomy at the L4-5 level.  The risks and benefits of the surgery  were discussed with the patient.  She wishes to proceed.  Medical clearance  was obtained from Dr. Crista Luria.   PROCEDURE:  The patient was taken to the OR on July 11, 2003, to undergo  lateral recess decompression, microdiskectomy at L4-5 level.  Surgeon was  Dr. Jene Every, assistant was Roma Schanz, P.A.-C.  Anesthesia was  general.  Complications none.   CONSULTATIONS:  PT and OT.   LABORATORY DATA:  Preoperative labs showed a hemoglobin of 12.8, hematocrit  37.9.  Postoperative lab values showed a white blood cell count of 7.8,  hemoglobin 11.6, hematocrit 34.1.  Routine chemistries done preoperatively  were all normal with a sodium of 139, potassium 4.1.  Postoperative  chemistries were obtained which showed a sodium of 140, potassium 4.8,  slightly elevated glucose at 179.  Preoperative urinalysis showed trace  leukocyte esterase.  Preoperative chest x-ray shows no active disease.  I do  not see a preoperative EKG in the chart.   HOSPITAL COURSE:  The patient was taken to the OR, underwent the above  stated procedure without difficulty.  She was then transferred to the PACU  for continued postoperative care.  In the PACU, the patient noted some  improvement in her lower extremity pain, however, continued to note pain in  the left hip.  In the recovery room, the patient was injected with Cortisone  as well as lidocaine by Dr. Jene Every in the greater trochanteric bursa  area.  The patient was then transferred to the orthopaedic floor for  continued postoperative care.  On postoperative day #1, the patient was  doing significantly better.  She noted significant improvement in her left  lower extremity.  The drain was removed.   Neurovascular exam was intact.  PT  was consulted for out of bed as well as ADLs.  Pain was well controlled.  On  postoperative day #2, the patient continued to do very well.  At this point  it was felt that she was stable to be discharged home.  Lower extremity pain  was well controlled.  She was taking p.o. well.  All laboratory data was  stable.   DISPOSITION:  The patient was discharged home with the help of her husband.   DISCHARGE INSTRUCTIONS:  She is to have a follow up visit with Dr. Shelle Iron 10  to 14 days following surgery.  She is to call for an appointment.   DISCHARGE MEDICATIONS:  1. Percocet p.r.n. pain.  2. Robaxin p.r.n. spasm.  3. Resume all home medications.   ACTIVITY:  Use back precautions with no bending, stooping, lifting, or  twisting.  She can walk as tolerated.   WOUND CARE:  Change her dressing daily.  It is okay to shower on  postoperative day #3.   DIET:  As tolerated.     Roma Schanz, P.A.                   Jene Every, M.D.    CS/MEDQ  D:  08/07/2003  T:  08/07/2003  Job:  [id number]

## 2011-01-14 NOTE — Op Note (Signed)
   NAME:  Deborah, [name]                          ACCOUNT NO.:  [id number]   MEDICAL RECORD NO.:  [id number]                   PATIENT TYPE:  OIB   LOCATION:  3172                                 FACILITY:  [facility]   PHYSICIAN:  Payton Doughty, M.D.                   DATE OF BIRTH:  [birth year]   DATE OF PROCEDURE:  11/06/2002  DATE OF DISCHARGE:                                 OPERATIVE REPORT   PREOPERATIVE DIAGNOSIS:  Carpal tunnel syndrome.   POSTOPERATIVE DIAGNOSIS:  Carpal tunnel syndrome.   OPERATION PERFORMED:  Left carpal tunnel release.   SURGEON:  Payton Doughty, M.D.   ANESTHESIA:  1% local lidocaine with anesthesia support.   COMPLICATIONS:  None.   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   DESCRIPTION OF PROCEDURE:  The patient was taken to the operating room, had  some mild sedation applied.  Following shave, prep and drape in the usual  sterile fashion the area of the skin 1 cm distal to the distal wrist crease  halfway between the hamate and pisiform bones was injected in a linear  fashion.  A 1 cm incision was made and the palmar fat dissected down to the  transverse carpal ligament.  Demonstrated was the thenar branch of the  median nerve outside of the carpal tunnel.  Carefully avoiding this, the  carpal tunnel was opened.  The nerve was tracked down onto the wrist and out  onto the palm where the transverse carpal ligament was divided freeing up  the nerve and protecting the thenar branch.  Following complete  decompression, the wound was irrigated and hemostasis assured.  The skin was  closed with a single layer of interrupted vertical mattress nylon.  The  patient was then returned to the recovery room after placing the standard  hand wrap and bacitracin.                                                Payton Doughty, M.D.    MWR/MEDQ  D:  11/06/2002  T:  11/06/2002  Job:  [id number]

## 2011-01-14 NOTE — H&P (Signed)
[place]. [facility]  Patient:    [name], MULDREW                         MRN: [id number] Adm. Date:  02/06/01 Attending:  Payton Doughty, M.D.                         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  HISTORY:  This 77 year old, right-handed white lady has had neck pain for a long time.  She has had episodes of numbness going down her left arm with numbness of her left hand.  She also has low back pain, pain in the buttocks running down more on the right than the left, but not to the foot, but the lateral aspect of the right shin.  She on examination was found to have spondylitic myelopathy, and is now admitted for an anterior cervical diskectomy and fusion.  PAST MEDICAL HISTORY: 1. Remarkable for restless legs. 2. GI upset. 3. Osteoporosis. 4. Hypercholesterolemia.  CURRENT MEDICATIONS:  1. Sinemet 25 mg t.i.d. for restless legs.  2. Protonix 40 mg one or two q.d.  3. Actonel 30 mg once q. week.  4. Lipitor 10 mg q.d.  5. Activella 1.05 mg q.d. for hormone replacement.  6. Vioxx 25 mg q.d.  7. Requip 0.5 mg q.d. for restless legs.  8. Hydrochlorothiazide 25 mg q.d. for fluid retention.  9. Vicodin on a p.r.n. basis. 10. Ativan 0.5 mg for anxiety.  ALLERGIES:  She is sensitive to INDERAL.  It causes vasculitis.  AMOXICILLIN causes her mouth to swell.  ERYTHROMYCIN causes thrush.  LORABID - She cannot remember why she is allergic to it, but swears she is.  PAST SURGICAL HISTORY: 1. Tonsillectomy. 2. Hemorrhoidectomy. 3. Appendectomy. 4. Fibroid cysts removed. 5. Bladder tack-up. 6. Hemangioma. 7. EPS ablation in the 1970s. 8. Sinus surgery.  SOCIAL HISTORY:  She smokes 1/2 pack of cigarettes a day.  She is working with a nicotine inhaler to quit.  She does not drink.  She is retired.  FAMILY HISTORY:  Parents are deceased.  History is already given.  REVIEW OF SYSTEMS:   Remarkable for glasses, tenderness, nasal drainage, sinus problems, hypercholesterolemia, leg pain all the time, asthma, bronchitis, indigestion, gastritis, arm weakness, leg weakness, back pain, arm pain, leg pain, joint pain, arthritis, and neck pain.  Difficulty with her memory, anxiety, and nasal allergies.  PHYSICAL EXAMINATION:  HEENT:  Within normal limits.  NECK:  She has a limited range of motion in her neck.  There is a Lhermittes phenomenon with forward bending.  CHEST:  Diffuse crackles.  CARDIAC:  A regular rate and rhythm with no murmur.  ABDOMEN:  Nontender with no hepatosplenomegaly.  EXTREMITIES:  No clubbing or cyanosis.  Peripheral pulses are good.  GENITOURINARY:  Deferred.  NEUROLOGIC:  She is awake, alert, oriented.  Her cranial nerves are intact. Motor examination shows 5/5 strength throughout the upper and lower extremities.  Sensory deficits described in the left C5, C6, and C7 distribution.  Reflexes 2 at the biceps, 2 at the triceps, 1 at the brachial radialis.  Hoffmans positive bilaterally.  Lower extremity strength is full. She has a somewhat limited range of motion of her lumbar spine.  There is no sensory deficit.  Reflexes intact with no clonus.  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  She has bad spondylitic disease  at C4-5, C5-6, and C6-7, with foraminal narrowing and posterior displacement of the cord and spinal stenosis at each level.  In the lumbar spine she has a small disk at L2-3 and degenerative changes at several other levels, including L3-4 and L4-5.  CLINICAL IMPRESSION:  Cervical spondylitic myelopathy.  Obviously this is the most pressing problem that would jeopardize her cord.  PLAN:  An anterior cervical diskectomy and fusion at C4-C5, C5-C6, and C6-C7. She understands that this is to prevent further compression of her spinal cord.  Regarding her lumbar spine, she needs to have epidural steroids soon, but then needs  to wait on getting her neck fixed.  The risks and benefits of this approach have been discussed with her, and she wishes to proceed. DD:  02/06/01 TD:  02/06/01 Job: [id number]

## 2011-01-14 NOTE — Consult Note (Signed)
[place]. [facility]  Patient:    [name], [name] Visit Number: [id number] MRN: [id number]          Service Type: EMS Location: [place] Attending Physician:  Doug Sou Dictated by:   Florencia Reasons, M.D. Proc. Date: 11/19/01 Admit Date:  11/19/2001 Discharge Date: 11/19/2001   CC:         Crista Luria, M.D.   Consultation Report  [name] was seen today in the [facility] Emergency Room where she had been directed when she called and reported abdominal cramps and bloody diarrhea.  The history was obtained primarily by Evlyn Kanner, P.A.-C. under the direction of Dr. Doug Sou.  Basically, [name] is known to me from prior evaluation for what has been thought to be irritable bowel syndrome.  She underwent endoscopy and colonoscopy by me in 2000 without any significant abnormalities being identified.  Incidentally, she is recently status post surgery on her right shoulder at which time she received some IV Cipro.  She has also been on Vioxx fairly regularly.  With that background, the patient got sick after eating [employer] fried chicken approximately 48 hours ago.  She developed within about six hours frequent vomiting and this emesis was nonbloody in character.  Shortly thereafter she developed diarrhea which became bloody.  She was a little bit woozy, but never frankly syncopal or severe orthostatic two nights ago when this first occurred.  Since then she has had no problems with dizziness or orthostatic symptoms.  The bloody character of the diarrhea has diminished and been replaced by passage of mucoid material.  The patient has quite a bit of low abdominal tenderness when she moves around, but otherwise no severe pain, just cramps.  As far as I am aware, there has been no problem with high fevers.  MEDICATIONS:  The patient is on numerous medications including Vioxx and also antispasmodics.  PAST SURGICAL HISTORY:   Remote appendectomy and the above mentioned shoulder surgery.  She also has a history of neck surgery since which time she has had problems with swallowing.  PAST MEDICAL HISTORY:  History of irritable bowel syndrome and hypertension.  FAMILY HISTORY:  Not otherwise obtained.  SOCIAL HISTORY:  Not otherwise obtained.  REVIEW OF SYSTEMS:  Not otherwise obtained.  PHYSICAL EXAMINATION  VITAL SIGNS:  In the emergency room the patient had a temperature of 98.8, blood pressure 116/81, pulse 98, afebrile.  GENERAL:  The patient is in no acute distress at the time of my evaluation.  HEENT:  She is anicteric and without frank pallor.  CHEST:  Clear.  HEART:  Normal.  ABDOMEN:  Sparse bowel sounds and is soft but there is rather diffuse tenderness, especially in the low abdomen without severe guarding and without any rebound.  LABORATORIES:  White count 15,900 predominantly polys, hemoglobin 13.4. Potassium 3.2, BUN 18, creatinine 0.9.  IMPRESSION:  Acute illness characterized by vomiting and dysenteric diarrhea which began after eating chicken at a [employer].  She also has mild to moderate leukocytosis and hypokalemia consistent with an infectious or inflammatory process and associated diarrhea, but no significant drop in hemoglobin which would suggest that the bleeding is mucosal in character rather than diverticular in origin, especially in view of the rather significant amount of pain.  I think the differential diagnoses includes food poisoning with associated dysenteric diarrhea or ischemic colitis.  PLAN:  Proceed with colonoscopic evaluation.  That should help to differentiate those two entities. Dictated by:  Florencia Reasons, M.D. Attending Physician:  Doug Sou DD:  11/19/01 TD:  11/20/01 Job: [id number]

## 2011-01-14 NOTE — Op Note (Signed)
[name], [name]                ACCOUNT NO.:  [id number]   MEDICAL RECORD NO.:  [id number]          PATIENT TYPE:  AMB   LOCATION:  DAY                          FACILITY:  [facility]   PHYSICIAN:  Jene Every, M.D.    DATE OF BIRTH:  10-25-1928   DATE OF PROCEDURE:  09/30/2004  DATE OF DISCHARGE:                                 OPERATIVE REPORT   PREOPERATIVE DIAGNOSIS:  Spinal stenosis, L2-3 and L3-4.   POSTOPERATIVE DIAGNOSIS:  Spinal stenosis, L2-3 and L3-4.   PROCEDURE PERFORMED:  Central lumbar decompression, L2-3, L3-4,  foraminotomies of L2, L3, L4 left.   ANESTHESIA:  General.   ASSISTANT:  Roma Schanz, P.A.   BRIEF HISTORY AND INDICATION:  This is a 77 year old status post lumbar  decompression of L4-5.  She has a degenerative scoliosis and persistent  predominantly lower extremity radicular pain and also having back pain, with  a myelogram indicating severe stenosis at L2-3 and a possible recess  stenosis, foraminal stenosis at L4 on the left.  Operative intervention is  indicated for a central decompression and evaluation of L3-4 on the left,  possibly decompression of the L4 root.  She had degenerative scoliosis.  We  indicated that it would not affect her back pain but mainly her leg pain,  though.  We discussed risks and benefits including bleeding, infection,  damage to vascular structures, CSF leakage, epidural fibrosis, adjacent  segment disease, the need for fusion in the future, anesthetic  complications, DVT, PE, etc.   TECHNIQUE:  Patient in supine.  After adequate induction of general  anesthesia and 1 g Kefzol, she was placed prone on the [place] frame, all  bony prominences well-padded.  The lumbar region was prepped and draped in  the usual sterile fashion.  Two 18-gauge spinal needles were utilized to  localize the L2-3 spinous process.  An incision was made from the spinous  process of L1 to the spinous process of L4, subcutaneous tissue was  dissected and electrocautery utilized to achieve hemostasis.  The dorsal  lumbar fascia was identified and divided along the line of the skin  incision, the paraspinous muscles elevated from the lamina of L2, L3,  partially of L1, partially of L4.  A Kocher was placed in the spinous  process of L2.  This was confirmed.  Next, a McCullough retractor was placed  __________ spinous process of L2 or L3.  The patient had fairly soft bone.  We then performed a decompression by central laminectomy of L2 and L3  utilizing the 2 and 3 mm Kerrison, first decompressing the lateral recess  without traction on the thecal sac utilizing the 3 mm Kerrison.  We used  neuro patties.  Bipolar cautery was utilized to achieve hemostasis.  She had  severe lateral recess stenosis left and right at L2-3, also on the left at  L3-4 and performed a foraminotomy of L4, L3, and L2 on the left,  decompressing all three roots with the neural elements well-protected.  We  examined each of the disks, and they were hardened without evidence of  soft  disk herniation amenable to diskectomy.  A hockey stick probe placed in the  foramina of L2, L3 and L4 bilaterally after a central decompression,  revealing they were widely patent, as was the central canal.  Good  decompression bilaterally and again a combination of 2 and 3 mm Kerrisons  were utilized to undercut the facet bilaterally at all levels.  The wound  copiously irrigated with bone wax on the cancellous surfaces and a probe  beneath the lamina down at 4 to be widely patent distally as well as  cephalad.  After the laminectomy of L2, I did remove some ligamentum flavum  from L1-2.  No retraction utilized centrally.  The wound copiously  irrigated.  Bipolar cautery was utilized to achieve hemostasis.  Bone wax  was placed again on all the cancellous surfaces.  Thrombin-soaked Gelfoam  was placed in the laminotomy defect.  We then placed a Hemovac and brought  it through  a lateral stab wound in the skin.  I then removed the retractors,  the paraspinous muscle inspected, electrocautery utilized to achieve  hemostasis.  Repaired the dorsal lumbar fascia with #1 Vicryl interrupted  figure-of-eight sutures, the subcutaneous tissue reapproximated with 0 and 2-  0 Vicryl simple sutures, the skin was reapproximated with staples.  The  wound was dressed sterilely.  A Hemovac was then placed and brought out  through a lateral stab wound in the skin.  The wound was dressed sterilely.  She was placed supine on the hospital bed, extubated without difficulty, and  transported to the recovery room in satisfactory condition.   The patient tolerated the procedure well with no complications.  Roma Schanz, P.A., assistant.  Blood loss was 100 mL.      JB/MEDQ  D:  09/30/2004  T:  09/30/2004  Job:  [id number]

## 2011-01-14 NOTE — Op Note (Signed)
[place]. [facility]  Patient:    [name], [name] Visit Number: [id number] MRN: [id number]          Service Type: SUR Location: 4W 0484 01 Attending Physician:  Pierce Crane Dictated by:   Javier Docker, M.D. Proc. Date: 11/07/01 Admit Date:  11/07/2001                             Operative Report  PREOPERATIVE DIAGNOSIS:  Rotator cuff tear, acromioclavicular arthrosis, and impingement syndrome of right shoulder.  POSTOPERATIVE DIAGNOSIS:  Rotator cuff tear, acromioclavicular arthrosis, and impingement syndrome of right shoulder.  PROCEDURE PERFORMED:  Open rotator cuff repair, acromioplasty, distal clavicle resection, repair of rotator cuff with Mitek suture anchor.  ANESTHESIA:  General.  SURGEON:  Javier Docker, M.D.  ASSISTANT:  Irena Cords, P.A.-C.  INDICATIONS:  The patient is a 77 year old with rotator cuff tear, AC arthrosis, impingement syndrome, fractured shoulder pain.  Operative intervention is indicated for decompression, distal clavicle resection, and rotator cuff repair.  Risks and benefits were discussed including bleeding, infection, damage to vascular structures, no change in symptoms, need for postoperative mobilization, etc.  DESCRIPTION OF PROCEDURE:  The patient was placed in the supine beach chair position.  After adequate induction of general anesthesia and antibiotics IV, the right shoulder precordial region and upper extremity was prepped and draped in the usual sterile fashion.  Care was taken to place the ________ in neutral position at all times due to her history of cervical spondylosis and cervical fusion.  Next, an incision was made over the ________ lines with dissection of the acromion.  The subcutaneous tissue was dissected and electrocautery was utilized to achieve hemostasis.  The raphe between the anterolateral head of the deltoid was identified and divided, up over the [facility] joint.  The  distal clavicle was skeletonized.  We divided the deltotrapezial fascia.  Homans were placed on either side of the clavicle and a small baby Bennett beneath the clavicle to elevate it off the rotator cuff.  There was a large osteophyte off its distal edge and this was removed with a Beyer rongeur and oscillating saw and further contoured with a 3 mm Kerrison, decompressing the rotator cuff. There was no tear in the rotator cuff here noted.  Next, the ligament was detached, and the anterior aspect of the acromion was removed with a Beyer rongeur followed by an oscillating saw and high speed bur. Approximately 3 mm was removed, generating a type 1 type acromion.  There was significant impingement anteriorly from this.  Next, hypertrophic bursa was then excised.  Inspection of the rotator cuff revealed a full thickness tear in the supraspinatus at its attachment.  The skin at the rotator cuff edges were debrided, and the high speed bur utilized to make a trough.  A single Mitek suture anchor was then placed and the trough in the tendon was then repaired into the trough.  The tear was only a 0.5 cm x 0.5 cm.  It was then oversewed with #1 Vicryl.  Excellent closure and no tension on the wound noted.  The remainder of inspection of the cuff revealed no evidence of residual tear.  There was some adhesive capsulitis noted prior to this, and additional manipulation was utilized to lyse these adhesions.  Good range of motion was achieved following this.  Next, the wound was copiously irrigated.  No residual impingement was seen at the [facility]  joint or beneath the subacromial joint.  The capsule over the [facility] joint was repaired with #1 Vicryl sutures.  The deltotrapezial fascia was repaired as well.  The raphe between the anterior and lateral heads were repaired with #1 Vicryl interrupted figure-of-eight sutures, utilizing the deep fascia of the deltoid as well as over the acromion to good bleeding  tissue.  Excellent repair was noted.  Good range of motion without tension on the repair site. There is no active bleeding noted.  Next, the subcutaneous tissue was approximated with 2-0 Vicryl simple sutures. The skin was reapproximated with 4-0 subcuticular Prolene.  The wound was dressed sterilely.  The patient placed in immobilizer and abduction pillow. Extubated without difficulty and transported to the recovery room in satisfactory condition.  The patient tolerated the procedure well and without complications. Dictated by:   Javier Docker, M.D. Attending Physician:  Pierce Crane DD:  11/07/01 TD:  11/08/01 Job: [id number]

## 2011-01-14 NOTE — Discharge Summary (Signed)
[facility]  Patient:    [name], [name]                       MRN: [id number] Adm. Date:  [id number] Disc. 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  PROCEDURE:  Anterior cervicectomy and fusion at C4-5, C5-6, C6-7.  COMPLICATIONS:  None.  DISCHARGE STATUS:  Alive and well.  HISTORY:  This is a 77 year old right-handed white female with history and physical as recounted on the chart.  She had neck pain for a long time.  She developed some myelopathic findings.  She also had some lumbar difficulties but MRIs demonstrated significant compression of cervical spine and she is admitted for anterior cervicectomy and fusion.  PAST MEDICAL HISTORY:  Remarkable for restless legs, osteoporosis, hypercholesterolemia.  MEDICATIONS:  Sinemet, Protonix, Actonel, Lipitor, Activella, Vioxx, Requip, hydrochlorothiazide, Vicodin, Ativan.  ALLERGIES:  INDERAL, AMOXICILLIN, ERYTHROMYCIN, LORABID.  PAST SURGICAL HISTORY:  Tonsillectomy, hemorrhoidectomy, appendectomy, Wolff-Parkinson-White and underwent ablation numerous years ago.  PHYSICAL EXAMINATION  GENERAL:  Unremarkable.  NEUROLOGIC:  Mild myelopathic findings including positive Hoffmans.  She was admitted after ______ normal laboratory values and underwent anterior cervicectomy and fusion at C4-5, C5-6, and C6-7.  Postoperatively she spent one night in the ICU and did well.  Was transferred to the floor.  Complained of some dysphagia for several days as is common after an anterior procedure. Her incision is now dry and well healed.  Her strength is full.  She is up walking about and pain is controlled with Vicodin.  She is being discharged home in the care of her family.  Her followup will be in the [facility] office in about two weeks with a lateral C spine x-ray.  She knows to wear her brace. DD:  02/09/01 TD:  02/09/01 Job: [id number]

## 2011-01-14 NOTE — Op Note (Signed)
NAME:  [name], [name]                          ACCOUNT NO.:  [id number]   MEDICAL RECORD NO.:  [id number]                   PATIENT TYPE:  INP   LOCATION:  0468                                 FACILITY:  [facility]   PHYSICIAN:  Jene Every, M.D.                 DATE OF BIRTH:  12-07-1928   DATE OF PROCEDURE:  DATE OF DISCHARGE:                                 OPERATIVE REPORT   PREOPERATIVE DIAGNOSES:  Spinal stenosis, lateral recess stenosis, herniated  nucleus pulposus L4-5.   POSTOPERATIVE DIAGNOSES:  Spinal stenosis, lateral recess stenosis,  herniated nucleus pulposus L4-5.   PROCEDURE:  Central decompression, bilateral L4 foraminotomies,  microdiskectomy L4-5 left.   ANESTHESIA:  General.   BRIEF HISTORY:  A 77 year old with refractory lower extremity radicular  pain. MRI indicating foraminal stenosis particularly on the left, L4-5 with  HNP into the foramen. Operative intervention was indicated with  decompression. She had degenerative scoliosis. The risks and benefits were  discussed including bleeding, infection, injury to neurovascular structures,  CSF leakage, epidural fibrosis, adjacent segment disease, need for fusion,  further decompression, etc.   TECHNIQUE:  The patient in the supine position after an adequate level of  general anesthesia and about 400 mg IV Cipro, she was placed prone on the  [place] frame, all bony prominences well padded. The lumbar region was  prepped and draped in the usual sterile fashion. Two 18 gauge spinal needles  utilized to localize the 4-5 interspace, confirmed with x-ray. An incision  was made from the spinous process of 4 to 5, the subcutaneous tissues  dissected, electrocautery utilized to achieve hemostasis. The dorsolumbar  fascia identified by __________ and paraspinous muscles elevated from the  lamina of 4 and 5.  A McCullough retractor was placed, operating microscope  was draped and brought onto the surgical field,  confirmatory radiographs  obtained, Kocher's on the spinous process of 4 and 5. Due to the small  intralaminar window, central decompression was performed with only partial  removal of the spinous processes of 4 and 5 and ligamentum flavum from the  interspace. Hypertrophic ligamentum flavum was noted bilaterally in the  lateral recesses. The ligamentum flavum was removed from the interspace  bilaterally. First hemilaminotomy was performed on the caudad edge of 4 and  5 on the left. Severe stenosis in the lateral recess and into the L4  foramen. This was decompressed with 2 mm Kerrison's performing a  foraminotomy. I gently mobilized the thecal sac medially, stenosis was noted  in the 4 foramen secondary to foraminal HNP. With the neural elements well  protected, I performed the annulotomy and subannularly I removed a herniated  disk with a nerve hook retrieved with pituitary. Following this, the foramen  was widely patent following the decompression, the foraminotomy and the  microdiskectomy. I looked at the contralateral side and there was a small  HNP at L4-5 on  the right that was well into the foramen. I performed an  annulotomy and we coaxed an HNP from the foramen from this side  decompressing the 4 root and the lateral recess as well. The hockey stick  probe was then placed and both foramen found to be widely patent at 4 and at  5. The wound was copiously irrigated, bipolar electrocautery was utilized to  achieve hemostasis. A Hemovac was placed and brought out through a lateral  stab wound in the skin. Thrombin soaked Gelfoam was placed and laminotomy  defect repaired, fascia with #1 Vicryl interrupted figure-of-eight sutures,  subcutaneous tissue reapproximated with 2-0 Vicryl simple sutures. The skin  was reapproximated with  staples, wound was dressed sterilely. The patient was placed supine on the  hospital bed, extubated without difficulty and transported to the recovery  room  in satisfactory condition.   The patient tolerated the procedure well with no complications.                                               Jene Every, M.D.    [name]  D:  07/11/2003  T:  07/11/2003  Job:  [id number]

## 2011-01-14 NOTE — H&P (Signed)
[facility]  Patient:    [name], [name] Visit Number: [id number] MRN: [id number]          Service Type: Attending:  Javier Docker, M.D. Dictated by:   Dorie Rank, P.A. Adm. Date:  11/07/01                           History and Physical  DATE OF BIRTH:  04/24/1929  CHIEF COMPLAINT:  Right shoulder pain.  HISTORY OF PRESENT ILLNESS:  The patient is a pleasant 77 year old female who has had a long history of right shoulder pain that has been refractory to conservative treatment.  It is at the point where it is interfering with her activities of daily living and her sleep at night.  She was seen in the office, and on physical exam it was noted she had a positive impingement sign with tenderness over the [facility] joint on the right.  She had a slight decrease in internal rotation.  MRI of her right shoulder revealed rotator cuff arthropathy, AC joint arthrosis, and impingement with possible rotator cuff tear.  It was felt due to her diagnostic studies, physical exam, as well as limitations she would benefit from undergoing a subacromial decompression, distal clavicle resection, and right rotator cuff repair.  The risks and benefits as well as the procedure were discussed with the patient, and she elected to proceed.  She is currently in the process of obtaining cardiac clearance from Dr. Verdis Prime.  ALLERGIES:  INDERAL, AMOXICILLIN, ERYTHROMYCIN, LORABID.  MEDICATIONS: 1. Sinemet 25-100 mg, 2-3 p.r.n. restless leg. 2. Protonix 40 mg, 1 p.o. b.i.d. 3. Actonel 30 mg each week. 4. Lipitor 10 mg, 1 p.o. q.d. 5. Vioxx 25 mg, 1 p.o. q.d. 6. Requip 0.5 mg, 1 p.o. q.d. 7. Hydrochlorothiazide 25 mg, 1 p.o. q.d. 8. Vicodin 5 mg p.r.n. pain. 9. Proventil p.r.n. asthma.  PAST MEDICAL HISTORY: 1. Restless leg syndrome. 2. Occasional asthma symptoms, relieved with albuterol inhaler. 3. History of Wolff-Parkinson-White syndrome in the distant past.  She  states    that she underwent ablation with no subsequent sequelae. 4. Episode of angina in November 2002.  She is followed by Dr. [name] for this.    She reports that she takes nitroglycerin for chest pain. 5. History of urinary stress incontinence. 6. Osteoporosis. 7. Inflammatory bowel. 8. Degenerative disk disease.  FAMILY HISTORY:  Father deceased, history of myocardial infarct.  Mother deceased, history of colon and liver cancer.  PAST SURGICAL HISTORY: 1. Tonsillectomy. 2. Hemorrhoidectomy. 3. Appendectomy. 4. Fibroid cyst removal. 5. Bladder tack.  SOCIAL HISTORY:  The patient is married.  She is retired.  She smokes one-half pack per day.  Denies any alcohol use.  She has two children.  She lives in a one-level home.  Her husband will be available after her hospital stay for her care.  REVIEW OF SYSTEMS:  GENERAL:  The patient reports she has had a recent upper respiratory infection.  No fevers, chills, or night sweats.  PULMONARY:  She does have some postnasal discharge which gives her a little bit of a cough every now and then.  No shortness of breath or hemoptysis.  CARDIOVASCULAR: No chest pain, angina, or orthopnea.  GASTROINTESTINAL:  No nausea, vomiting, diarrhea, melena, or constipation.  GENITOURINARY:  No hematuria, dysuria, or discharge.  NEUROLOGIC:  No seizures, headaches, or paralysis. MUSCULOSKELETAL:  Right shoulder pain, as discussed above.  PHYSICAL EXAMINATION:  GENERAL:  Alert and oriented, talkative 77 year old female.  Well-developed, well-nourished, in mild distress today.  VITAL SIGNS:  Pulse 80, respirations 16, blood pressure 130/90.  HEENT:  Upper and lower partial in place today.  Oropharynx is clear.  No postnasal discharge noted.  NECK:  Supple.  Negative for carotid bruits bilaterally.  Negative for cervical lymphadenopathy palpated on exam.  LUNGS:  Scattered wheezes throughout all lung fields bilaterally.  No rales or crackles  auscultated.  BREASTS:  Not pertinent to present illness.  HEART:  S1, S2.  Negative for murmur, rub, or gallop.  Regular rate and rhythm.  ABDOMEN:  Soft and nontender.  Positive bowel sounds.  GENITOURINARY:  Not pertinent to present illness.  EXTREMITIES:  Decreased range of motion to her right shoulder with abduction, extension.  On forward flexion she has pain with motion.  SKIN:  Intact.  No rashes or lesions appreciated on exam.  IMPRESSION: 1. Right rotator cuff repair/acromioclavicular arthrosis. 2. History of asthma. 3. History of angina with distant history of Wolff-Parkinson-White syndrome    in the past. 4. Osteoporosis. 5. Inflammatory bowel.  PLAN:  The patient will be scheduled for a right rotator cuff repair and subacromial decompression, distal clavicle resection pending cardiac clearance from Dr. [name].  The patient will contact him first thing in the morning to have a clearance sent to [facility]. Dictated by:   Dorie Rank, P.A. Attending:  Javier Docker, M.D. DD:  11/06/01 TD:  11/07/01 Job: [id number]

## 2011-05-19 ENCOUNTER — Other Ambulatory Visit (HOSPITAL_COMMUNITY): Payer: Self-pay | Admitting: Gastroenterology

## 2011-05-19 LAB — BASIC METABOLIC PANEL
Calcium: 8.4
Sodium: 138

## 2011-05-19 LAB — CBC
HCT: 34 — ABNORMAL LOW
HCT: 41.8
Hemoglobin: 11.6 — ABNORMAL LOW
Hemoglobin: 14.1
MCHC: 34
MCV: 94.4
Platelets: 225
Platelets: 303
RBC: 4.43
RDW: 12.9
WBC: 5.1
WBC: 5.7

## 2011-05-31 LAB — BASIC METABOLIC PANEL
CO2: 29
Chloride: 104
Creatinine, Ser: 0.93
GFR calc Af Amer: >60
Sodium: 139

## 2011-05-31 LAB — CBC
HCT: 41.3
Hemoglobin: 14.2
MCHC: 34.4
MCV: 94.9
RBC: 4.35

## 2011-06-01 ENCOUNTER — Ambulatory Visit (HOSPITAL_COMMUNITY)
Admission: RE | Admit: 2011-06-01 | Discharge: 2011-06-01 | Disposition: A | Discharge status: Home / Self Care | Payer: Medicare Other | Source: Ambulatory Visit | Attending: Gastroenterology | Admitting: Gastroenterology

## 2011-06-01 ENCOUNTER — Other Ambulatory Visit (HOSPITAL_COMMUNITY): Payer: Self-pay | Admitting: Gastroenterology

## 2011-06-01 DIAGNOSIS — IMO0002 Reserved for concepts with insufficient information to code with codable children: Secondary | ICD-10-CM | POA: Insufficient documentation

## 2011-06-01 DIAGNOSIS — R1310 Dysphagia, unspecified: Secondary | ICD-10-CM | POA: Insufficient documentation

## 2011-06-01 DIAGNOSIS — K228 Other specified diseases of esophagus: Secondary | ICD-10-CM | POA: Insufficient documentation

## 2011-06-01 DIAGNOSIS — K2289 Other specified disease of esophagus: Secondary | ICD-10-CM | POA: Insufficient documentation

## 2011-06-01 DIAGNOSIS — T17308A Unspecified foreign body in larynx causing other injury, initial encounter: Secondary | ICD-10-CM | POA: Insufficient documentation

## 2011-06-01 DIAGNOSIS — Q391 Atresia of esophagus with tracheo-esophageal fistula: Secondary | ICD-10-CM | POA: Insufficient documentation

## 2011-06-28 ENCOUNTER — Other Ambulatory Visit: Payer: Self-pay | Admitting: Gynecology

## 2011-06-28 DIAGNOSIS — Z1231 Encounter for screening mammogram for malignant neoplasm of breast: Secondary | ICD-10-CM

## 2011-07-19 ENCOUNTER — Ambulatory Visit
Admission: RE | Admit: 2011-07-19 | Discharge: 2011-07-19 | Disposition: A | Discharge status: Home / Self Care | Payer: Medicare Other | Source: Ambulatory Visit | Attending: Gynecology | Admitting: Gynecology

## 2011-07-19 DIAGNOSIS — Z1231 Encounter for screening mammogram for malignant neoplasm of breast: Secondary | ICD-10-CM

## 2011-07-20 ENCOUNTER — Ambulatory Visit: Payer: Medicare Other

## 2011-12-14 ENCOUNTER — Other Ambulatory Visit (HOSPITAL_COMMUNITY): Payer: Self-pay | Admitting: Physical Medicine and Rehabilitation

## 2011-12-14 DIAGNOSIS — M545 Low back pain, unspecified: Secondary | ICD-10-CM

## 2011-12-14 DIAGNOSIS — R102 Pelvic and perineal pain: Secondary | ICD-10-CM

## 2011-12-20 ENCOUNTER — Encounter (HOSPITAL_COMMUNITY)
Admission: RE | Admit: 2011-12-20 | Discharge: 2011-12-20 | Disposition: A | Discharge status: Home / Self Care | Payer: Medicare Other | Source: Ambulatory Visit | Attending: Physical Medicine and Rehabilitation | Admitting: Physical Medicine and Rehabilitation

## 2011-12-20 ENCOUNTER — Encounter (HOSPITAL_COMMUNITY): Payer: Self-pay

## 2011-12-20 DIAGNOSIS — M4120 Other idiopathic scoliosis, site unspecified: Secondary | ICD-10-CM | POA: Insufficient documentation

## 2011-12-20 DIAGNOSIS — R102 Pelvic and perineal pain: Secondary | ICD-10-CM

## 2011-12-20 DIAGNOSIS — M545 Low back pain, unspecified: Secondary | ICD-10-CM | POA: Insufficient documentation

## 2011-12-20 DIAGNOSIS — M25559 Pain in unspecified hip: Secondary | ICD-10-CM | POA: Insufficient documentation

## 2011-12-20 MED ORDER — TECHNETIUM TC 99M MEDRONATE IV KIT
25.0000 | PACK | Freq: Once | INTRAVENOUS | Status: AC | PRN
  Administered 2011-12-20: 25 via INTRAVENOUS

## 2012-05-15 ENCOUNTER — Other Ambulatory Visit: Payer: Self-pay | Admitting: Physical Medicine and Rehabilitation

## 2012-05-15 DIAGNOSIS — M542 Cervicalgia: Secondary | ICD-10-CM

## 2012-05-16 ENCOUNTER — Ambulatory Visit
Admission: RE | Admit: 2012-05-16 | Discharge: 2012-05-16 | Disposition: A | Discharge status: Home / Self Care | Payer: Medicare Other | Source: Ambulatory Visit | Attending: Physical Medicine and Rehabilitation | Admitting: Physical Medicine and Rehabilitation

## 2012-05-16 DIAGNOSIS — M542 Cervicalgia: Secondary | ICD-10-CM

## 2012-05-16 MED ORDER — IOHEXOL 300 MG/ML  SOLN
75.0000 mL | Freq: Once | INTRAMUSCULAR | Status: AC | PRN
  Administered 2012-05-16: 75 mL via INTRAVENOUS

## 2012-06-07 ENCOUNTER — Other Ambulatory Visit: Payer: Self-pay | Admitting: Gynecology

## 2012-06-07 DIAGNOSIS — Z1231 Encounter for screening mammogram for malignant neoplasm of breast: Secondary | ICD-10-CM

## 2012-07-19 ENCOUNTER — Ambulatory Visit
Admission: RE | Admit: 2012-07-19 | Discharge: 2012-07-19 | Disposition: A | Discharge status: Home / Self Care | Payer: Medicare Other | Source: Ambulatory Visit | Attending: Gynecology | Admitting: Gynecology

## 2012-07-19 DIAGNOSIS — Z1231 Encounter for screening mammogram for malignant neoplasm of breast: Secondary | ICD-10-CM

## 2012-12-21 ENCOUNTER — Observation Stay (HOSPITAL_COMMUNITY)
Admission: EM | Admit: 2012-12-21 | Discharge: 2012-12-22 | Disposition: A | Discharge status: Home / Self Care | Payer: Medicare Other | Attending: Interventional Cardiology | Admitting: Interventional Cardiology

## 2012-12-21 ENCOUNTER — Other Ambulatory Visit: Payer: Self-pay

## 2012-12-21 ENCOUNTER — Encounter (HOSPITAL_COMMUNITY): Payer: Self-pay | Admitting: Family Medicine

## 2012-12-21 ENCOUNTER — Emergency Department (HOSPITAL_COMMUNITY): Payer: Medicare Other

## 2012-12-21 DIAGNOSIS — I456 Pre-excitation syndrome: Secondary | ICD-10-CM

## 2012-12-21 DIAGNOSIS — J4489 Other specified chronic obstructive pulmonary disease: Secondary | ICD-10-CM | POA: Insufficient documentation

## 2012-12-21 DIAGNOSIS — M545 Low back pain, unspecified: Secondary | ICD-10-CM | POA: Insufficient documentation

## 2012-12-21 DIAGNOSIS — K219 Gastro-esophageal reflux disease without esophagitis: Secondary | ICD-10-CM | POA: Insufficient documentation

## 2012-12-21 DIAGNOSIS — R079 Chest pain, unspecified: Secondary | ICD-10-CM

## 2012-12-21 DIAGNOSIS — F17200 Nicotine dependence, unspecified, uncomplicated: Secondary | ICD-10-CM

## 2012-12-21 DIAGNOSIS — F411 Generalized anxiety disorder: Secondary | ICD-10-CM | POA: Insufficient documentation

## 2012-12-21 DIAGNOSIS — R0789 Other chest pain: Principal | ICD-10-CM | POA: Insufficient documentation

## 2012-12-21 DIAGNOSIS — J449 Chronic obstructive pulmonary disease, unspecified: Secondary | ICD-10-CM | POA: Insufficient documentation

## 2012-12-21 DIAGNOSIS — I10 Essential (primary) hypertension: Secondary | ICD-10-CM | POA: Insufficient documentation

## 2012-12-21 DIAGNOSIS — I447 Left bundle-branch block, unspecified: Secondary | ICD-10-CM | POA: Insufficient documentation

## 2012-12-21 DIAGNOSIS — R5381 Other malaise: Secondary | ICD-10-CM | POA: Insufficient documentation

## 2012-12-21 DIAGNOSIS — G8929 Other chronic pain: Secondary | ICD-10-CM | POA: Insufficient documentation

## 2012-12-21 HISTORY — DX: Pre-excitation syndrome: I45.6

## 2012-12-21 HISTORY — DX: Chronic obstructive pulmonary disease, unspecified: J44.9

## 2012-12-21 HISTORY — DX: Nicotine dependence, unspecified, uncomplicated: F17.200

## 2012-12-21 HISTORY — DX: Restless legs syndrome: G25.81

## 2012-12-21 HISTORY — DX: Essential (primary) hypertension: I10

## 2012-12-21 LAB — BASIC METABOLIC PANEL
CO2: 28 mEq/L (ref 19–32)
Calcium: 9.3 mg/dL (ref 8.4–10.5)
Creatinine, Ser: 0.88 mg/dL (ref 0.50–1.10)
Glucose, Bld: 96 mg/dL (ref 70–99)

## 2012-12-21 LAB — CBC
Hemoglobin: 13.9 g/dL (ref 12.0–15.0)
MCH: 30.8 pg (ref 26.0–34.0)
MCV: 92 fL (ref 78.0–100.0)
RBC: 4.51 MIL/uL (ref 3.87–5.11)

## 2012-12-21 LAB — POCT I-STAT TROPONIN I

## 2012-12-21 MED ORDER — PANTOPRAZOLE SODIUM 40 MG PO TBEC
40.0000 mg | DELAYED_RELEASE_TABLET | Freq: Two times a day (BID) | ORAL | Status: DC
  Administered 2012-12-21 – 2012-12-22 (×2): 40 mg via ORAL
  Filled 2012-12-21 (×2): qty 1

## 2012-12-21 MED ORDER — NITROGLYCERIN 0.4 MG SL SUBL: 0.4000 mg | SUBLINGUAL_TABLET | SUBLINGUAL | Status: DC | PRN

## 2012-12-21 MED ORDER — ENOXAPARIN SODIUM 30 MG/0.3ML ~~LOC~~ SOLN
30.0000 mg | SUBCUTANEOUS | Status: DC
  Administered 2012-12-22: 30 mg via SUBCUTANEOUS
  Filled 2012-12-21: qty 0.3

## 2012-12-21 MED ORDER — ALBUTEROL SULFATE HFA 108 (90 BASE) MCG/ACT IN AERS
1.0000 | INHALATION_SPRAY | Freq: Two times a day (BID) | RESPIRATORY_TRACT | Status: DC
  Filled 2012-12-21: qty 6.7

## 2012-12-21 MED ORDER — CARBIDOPA-LEVODOPA 25-100 MG PO TABS
2.0000 | ORAL_TABLET | Freq: Three times a day (TID) | ORAL | Status: DC
  Administered 2012-12-21 – 2012-12-22 (×2): 2 via ORAL
  Filled 2012-12-21 (×4): qty 2

## 2012-12-21 MED ORDER — FENTANYL 25 MCG/HR TD PT72
25.0000 ug | MEDICATED_PATCH | TRANSDERMAL | Status: DC
  Administered 2012-12-22: 25 ug via TRANSDERMAL
  Filled 2012-12-21: qty 1

## 2012-12-21 MED ORDER — ASPIRIN 325 MG PO TABS
325.0000 mg | ORAL_TABLET | Freq: Once | ORAL | Status: AC
  Administered 2012-12-21: 325 mg via ORAL
  Filled 2012-12-21: qty 1

## 2012-12-21 MED ORDER — TRAZODONE HCL 50 MG PO TABS
50.0000 mg | ORAL_TABLET | Freq: Every day | ORAL | Status: DC
  Administered 2012-12-22: 50 mg via ORAL
  Filled 2012-12-21 (×2): qty 1

## 2012-12-21 MED ORDER — ONDANSETRON HCL 4 MG/2ML IJ SOLN: 4.0000 mg | Freq: Four times a day (QID) | INTRAMUSCULAR | Status: DC | PRN

## 2012-12-21 MED ORDER — LORAZEPAM 0.5 MG PO TABS: 0.5000 mg | ORAL_TABLET | Freq: Two times a day (BID) | ORAL | Status: DC | PRN

## 2012-12-21 MED ORDER — ASPIRIN EC 81 MG PO TBEC
81.0000 mg | DELAYED_RELEASE_TABLET | Freq: Every day | ORAL | Status: DC
  Administered 2012-12-22: 81 mg via ORAL
  Filled 2012-12-21: qty 1

## 2012-12-21 MED ORDER — NITROGLYCERIN 2 % TD OINT
1.0000 [in_us] | TOPICAL_OINTMENT | Freq: Four times a day (QID) | TRANSDERMAL | Status: DC
  Administered 2012-12-22 (×2): 1 [in_us] via TOPICAL
  Filled 2012-12-21: qty 30

## 2012-12-21 MED ORDER — NITROGLYCERIN 2 % TD OINT
0.5000 [in_us] | TOPICAL_OINTMENT | Freq: Once | TRANSDERMAL | Status: AC
  Administered 2012-12-21: 21:00:00 via TOPICAL
  Filled 2012-12-21: qty 1

## 2012-12-21 MED ORDER — CARBIDOPA-LEVODOPA 25-100 MG PO TABS: 2.0000 | ORAL_TABLET | Freq: Every day | ORAL | Status: DC

## 2012-12-21 MED ORDER — OXYCODONE HCL 5 MG PO TABS
15.0000 mg | ORAL_TABLET | Freq: Every day | ORAL | Status: DC
  Filled 2012-12-21: qty 3

## 2012-12-21 MED ORDER — OXYCODONE HCL ER 10 MG PO T12A
10.0000 mg | EXTENDED_RELEASE_TABLET | Freq: Once | ORAL | Status: AC
  Administered 2012-12-21: 10 mg via ORAL
  Filled 2012-12-21: qty 1

## 2012-12-21 MED ORDER — MAGNESIUM OXIDE 400 (241.3 MG) MG PO TABS
400.0000 mg | ORAL_TABLET | Freq: Every day | ORAL | Status: DC
  Administered 2012-12-22: 400 mg via ORAL
  Filled 2012-12-21: qty 1

## 2012-12-21 MED ORDER — ACETAMINOPHEN 325 MG PO TABS
650.0000 mg | ORAL_TABLET | ORAL | Status: DC | PRN
  Administered 2012-12-22: 650 mg via ORAL
  Filled 2012-12-21: qty 2

## 2012-12-21 NOTE — H&P (Signed)
History and Physical  Patient ID: [name] MRN: [id number], SOB: 02-01-1929 79 y.o. Date of Encounter: 12/21/2012, 9:40 PM  Primary Physician: No primary provider on file. Primary Cardiologist: Dr. Verdis Prime of [facility]  Chief Complaint: chest pain  HPI: 79 y.o. female w/ PMHx significant for WPW s/p ablation remotely, who presented to [facility] on 12/21/2012 with complaints of left chest pain. She reports that it occurred while she was working in [employer]. Left sided, intense, substernal that radiated up to her left shoulder and left elbow. No diaphoresis, no palpitations, no shortness of breath. Much different than her GERD and that is why she sought emergency care. Is quite anxious at baseline and concerned with her medical condition (lost 20 lbs over last 2 years, no appetite, chronic LBP, neck fusion problems). She took two old nitro at home, no effect.   By the time paramedics arrived, pain had subsided. Currently chest pain free. Denies any DOE, PND, LE swelling. No h/o blood clots. Had a stress test several years back, unclear symptoms at that time.   Continues to smoke 1 ppd. Not interested in quitting at this time (relieves anxiety).   No chest pain currently. Already taken aspirn. Previously intolerant to statin therapy for high cholesterol.  EKG revealed NSR with LBBB. No baseline for comparison. CXR was without acute cardiopulmonary abnormalities. Initial troponin negative.   Past Medical History  Diagnosis Date  . COPD (chronic obstructive pulmonary disease)   . Hypertension      Surgical History:  Past Surgical History  Procedure Laterality Date  . Tonsillectomy    . Appendectomy    . Hemangioma excision    . Kidney surgery      RT KIDNEY TACK  . Neck surgery    . Back surgery      MULTIPLE     Home Meds: Prior to Admission medications   Medication Sig Start Date End Date Taking? Authorizing Provider  albuterol (PROVENTIL HFA;VENTOLIN  HFA) 108 (90 BASE) MCG/ACT inhaler Inhale 1 puff into the lungs 2 (two) times daily.    Yes Historical Provider, MD  carbidopa-levodopa (SINEMET IR) 25-100 MG per tablet Take 2 tablets by mouth at bedtime.   Yes Historical Provider, MD  Cholecalciferol (VITAMIN D3) 2000 UNITS TABS Take 1 tablet by mouth daily.   Yes Historical Provider, MD  fentaNYL (DURAGESIC - DOSED MCG/HR) 25 MCG/HR Place 1 patch onto the skin every other day.   Yes Historical Provider, MD  LORazepam (ATIVAN) 1 MG tablet Take 0.5 mg by mouth 2 (two) times daily.   Yes Historical Provider, MD  magnesium oxide (MAG-OX) 400 MG tablet Take 400 mg by mouth daily.   Yes Historical Provider, MD  Oxycodone HCl 10 MG TABS Take 15 mg by mouth at bedtime.   Yes Historical Provider, MD  pantoprazole (PROTONIX) 40 MG tablet Take 40 mg by mouth 2 (two) times daily.   Yes Historical Provider, MD  polyethylene glycol (MIRALAX / GLYCOLAX) packet Take 17 g by mouth every evening.    Yes Historical Provider, MD  traZODone (DESYREL) 50 MG tablet Take 50 mg by mouth at bedtime.   Yes Historical Provider, MD    Allergies:  Allergies  Allergen Reactions  . Amoxicillin Anaphylaxis and Rash  . Ciprofloxacin Anaphylaxis and Other (See Comments)    Thrush  . Lorabid (Loracarbef) Anaphylaxis and Rash  . Avelox (Moxifloxacin) Other (See Comments)    Tendon and joint pain  . Erythromycin Other (  See Comments)    angioedema  . Inderal (Propranolol) Other (See Comments)    Vasculitis    History   Social History  . Marital Status: Married    Spouse Name: N/A    Number of Children: N/A  . Years of Education: N/A   Occupational History  . Not on file.   Social History Main Topics  . Smoking status: Current Every Day Smoker  . Smokeless tobacco: Not on file  . Alcohol Use: No  . Drug Use: Not on file  . Sexually Active: Not on file   Other Topics Concern  . Not on file   Social History Narrative  . No narrative on file     History  reviewed. No pertinent family history.  Review of Systems: General: negative for chills, fever, night sweats. Weight loss and appetite loss. Cardiovascular: see HPI. Dermatological: negative for rash Respiratory: negative for cough or wheezing Urologic: negative for hematuria Abdominal: negative for nausea, vomiting, diarrhea, bright red blood per rectum, melena, or hematemesis Neurologic: negative for visual changes, syncope, or dizziness All other systems reviewed and are otherwise negative except as noted above.  Labs:   Lab Results  Component Value Date   WBC 6.0 12/21/2012   HGB 13.9 12/21/2012   HCT 41.5 12/21/2012   MCV 92.0 12/21/2012   PLT 248 12/21/2012    Recent Labs Lab 12/21/12 1809  NA 139  K 4.2  CL 103  CO2 28  BUN 17  CREATININE 0.88  CALCIUM 9.3  GLUCOSE 96   No results found for this basename: CKTOTAL, CKMB, TROPONINI,  in the last 72 hours No results found for this basename: CHOL, HDL, LDLCALC, TRIG   No results found for this basename: DDIMER    Radiology/Studies:  Dg Chest 2 View  12/21/2012  *RADIOLOGY REPORT*  Clinical Data: Chest pain and shortness of breath.  CHEST - 2 VIEW  Comparison: Chest x-ray 10/30/2012.  Findings: Lungs appear hyperexpanded with flattening of the hemidiaphragms, increased retrosternal air space and pruning of the pulmonary vasculature in the periphery, suggestive of underlying COPD.  No acute consolidative airspace disease.  No pleural effusions.  Bibasilar pleural parenchymal scarring is chronic and similar to prior studies.  A small dense nodule in the left mid lung (between the anterior aspects of the left fifth and sixth ribs) is unchanged compared to prior study 07/10/2008, and presumably benign (potentially a small granuloma).  No other suspicious appearing pulmonary nodules or masses are otherwise noted.  No evidence of pulmonary edema.  Heart size is normal. Atherosclerosis in the thoracic aorta.  A spinal cord stimulator  is noted projecting over the mid thoracic spine.  Orthopedic fixation hardware is present in the lower cervical spine, and a soft tissue anchor is noted in the right humeral head, likely related to prior rotator cuff surgery.  IMPRESSION: 1.  No radiographic evidence of acute cardiopulmonary disease. 2.  The appearance of chest is essentially unchanged compared to prior studies, as above.   Original Report Authenticated By: Trudie Reed, M.D.      EKG: sinus, LBBB, no comparison  Physical Exam: Blood pressure 154/77, pulse 78, temperature 98.1 F (36.7 C), temperature source Oral, resp. rate 19, SpO2 98.00%. General: Well developed, well nourished, in no acute distress. Head: Normocephalic, atraumatic, sclera non-icteric, nares are without discharge Neck: Supple. Negative for carotid bruits. JVD not elevated. Lungs: Clear bilaterally to auscultation without wheezes, rales, or rhonchi. Breathing is unlabored. Heart: RRR with S1 S2.  No murmurs, rubs, or gallops appreciated. Abdomen: Soft, non-tender, non-distended with normoactive bowel sounds. No rebound/guarding. No obvious abdominal masses. Msk:  Strength and tone appear normal for age. Extremities: No edema. No clubbing or cyanosis. Distal pedal pulses are 2+ and equal bilaterally. Neuro: Alert and oriented X 3. Moves all extremities spontaneously. Psych:  Responds to questions appropriately with a normal affect.   Problem List 1. Chest pain, typical features, one episode 2. Tobacco abuse 3. Hyperlipidemia per report 4. Calcium on thoracic aorta by chest xray 5. Currently hypertensive 6. Chronic LBP, on chronic pain medications 7. Anxiety 8. GERD  ASSESSMENT AND PLAN:   79 y.o. female w/ PMHx significant for WPW s/p ablation remotely, who presented to [facility] on 12/21/2012 with complaints of left chest pain. Despite being a single episode, has typical features of left sided, extended period of time, radiating to neck and  left arm.  Negative initial troponin, EKG with LBBB (no baseline comparison). Risk factors of tobacco abuse, age, and atherosclerosis by chest xray. Appropriate to admit to obs, rule out with serial labs and EKGs and consider non-invasive stress testing in the AM (NPO just in case). Alternative diagnoses include anxiety, GERD, MSK problems due to hardware in neck.   Continue aspirin. No indication for therapeutic levels of anticoagulation unless pain recurs or + biomarkers.  Previously intolerant to statins. Check lipids in the AM.  Hold on beta blocker for now.  Continue home pain regimen.  Prophylaxis PPI Low dose lovenox Full code  Signed, Qunisha Bryk C. MD 12/21/2012, 9:40 PM

## 2012-12-21 NOTE — ED Provider Notes (Signed)
I saw and evaluated the patient, reviewed the resident's note and I agree with the findings and plan.  Presents with chest pain. Patient evaluating and does not have any evidence of acute infarction. Cardiology consult to admit the patient for further management.  Gilda Crease, MD 12/21/12 [phone]

## 2012-12-21 NOTE — ED Notes (Signed)
Per pt sts chest pain that started this afternoon. Took 2 nitro with relief. sts some dizziness and weakness.

## 2012-12-21 NOTE — ED Provider Notes (Signed)
History     CSN: [id number]  Arrival date & time 12/21/12  1759   First MD Initiated Contact with Patient 12/21/12 1934      Chief Complaint  Patient presents with  . Chest Pain    (Consider location/radiation/quality/duration/timing/severity/associated sxs/prior treatment) Patient is a 79 y.o. female presenting with chest pain. The history is provided by the patient, medical records and a relative (her daughter).  Chest Pain Pain location:  L chest and substernal area Pain quality: aching, dull, pressure and radiating   Pain radiates to:  L shoulder Pain radiates to the back: no   Pain severity:  Moderate Timing:  Constant Progression:  Resolved (with NTG) Context comment:  At rest Relieved by:  Nitroglycerin Worsened by:  Nothing tried Ineffective treatments:  None tried Associated symptoms: back pain (similar to previous)   Associated symptoms: no abdominal pain, no cough, no fever, no palpitations, no shortness of breath and not vomiting   Risk factors comment:  Elderly age, HTN   Past Medical History  Diagnosis Date  . COPD (chronic obstructive pulmonary disease)   . Hypertension     Past Surgical History  Procedure Laterality Date  . Tonsillectomy    . Appendectomy    . Hemangioma excision    . Kidney surgery      RT KIDNEY TACK  . Neck surgery    . Back surgery      MULTIPLE    History reviewed. No pertinent family history.  History  Substance Use Topics  . Smoking status: Current Every Day Smoker  . Smokeless tobacco: Not on file  . Alcohol Use: No    OB History   Grav Para Term Preterm Abortions TAB SAB Ect Mult Living                  Review of Systems  Constitutional: Negative for fever, chills, activity change and appetite change.  Respiratory: Positive for chest tightness. Negative for cough, shortness of breath and wheezing.   Cardiovascular: Positive for chest pain. Negative for palpitations.  Gastrointestinal: Negative for  vomiting, abdominal pain, diarrhea and constipation.  Genitourinary: Negative for dysuria, decreased urine volume and difficulty urinating.  Musculoskeletal: Positive for back pain (similar to previous) and arthralgias (similar to previous).  Skin: Negative for rash and wound.  Neurological: Negative for seizures, syncope and light-headedness.  Psychiatric/Behavioral: Negative for confusion and agitation.  All other systems reviewed and are negative.    Allergies  Ciprofloxacin; Amoxicillin; Avelox; Erythromycin; Inderal; and Lorabid  Home Medications  No current outpatient prescriptions on file.  BP 142/72  Pulse 78  Temp(Src) 98.1 F (36.7 C) (Oral)  Resp 19  SpO2 100%  Physical Exam  Nursing note and vitals reviewed. Constitutional: She is oriented to person, place, and time. She appears well-developed and well-nourished.  HENT:  Head: Normocephalic and atraumatic.  Right Ear: External ear normal.  Left Ear: External ear normal.  Nose: Nose normal.  Mouth/Throat: Oropharynx is clear and moist. No oropharyngeal exudate.  Eyes: Conjunctivae are normal. Pupils are equal, round, and reactive to light.  Neck: Normal range of motion. Neck supple.  Cardiovascular: Normal rate, regular rhythm, normal heart sounds and intact distal pulses.  Exam reveals no gallop and no friction rub.   No murmur heard. Symmetric 2+ radial pulses  Pulmonary/Chest: Effort normal and breath sounds normal. No respiratory distress. She has no wheezes. She has no rales. She exhibits no tenderness.  Abdominal: Soft. Bowel sounds are normal. She exhibits  no distension and no mass. There is no tenderness. There is no rebound and no guarding.  Musculoskeletal: Normal range of motion. She exhibits no edema and no tenderness.  Neurological: She is alert and oriented to person, place, and time.  Skin: Skin is warm and dry.  Psychiatric: She has a normal mood and affect. Her behavior is normal. Judgment and  thought content normal.    ED Course  Procedures (including critical care time)  Labs Reviewed  BASIC METABOLIC PANEL - Abnormal; Notable for the following:    GFR calc non Af Amer 60 (*)    GFR calc Af Amer 69 (*)    All other components within normal limits  CBC  POCT I-STAT TROPONIN I   Dg Chest 2 View  12/21/2012  *RADIOLOGY REPORT*  Clinical Data: Chest pain and shortness of breath.  CHEST - 2 VIEW  Comparison: Chest x-ray 10/30/2012.  Findings: Lungs appear hyperexpanded with flattening of the hemidiaphragms, increased retrosternal air space and pruning of the pulmonary vasculature in the periphery, suggestive of underlying COPD.  No acute consolidative airspace disease.  No pleural effusions.  Bibasilar pleural parenchymal scarring is chronic and similar to prior studies.  A small dense nodule in the left mid lung (between the anterior aspects of the left fifth and sixth ribs) is unchanged compared to prior study 07/10/2008, and presumably benign (potentially a small granuloma).  No other suspicious appearing pulmonary nodules or masses are otherwise noted.  No evidence of pulmonary edema.  Heart size is normal. Atherosclerosis in the thoracic aorta.  A spinal cord stimulator is noted projecting over the mid thoracic spine.  Orthopedic fixation hardware is present in the lower cervical spine, and a soft tissue anchor is noted in the right humeral head, likely related to prior rotator cuff surgery.  IMPRESSION: 1.  No radiographic evidence of acute cardiopulmonary disease. 2.  The appearance of chest is essentially unchanged compared to prior studies, as above.   Original Report Authenticated By: Trudie Reed, M.D.      1. Chest pain       MDM  79 yo F presents after episode of left-sided chest pressure with radiation to neck and left shoulder. Took 2 SL NTG with resolution of symptoms. Chest pain-free here. EKG with left bundle branch block; no old EKG for comparison. Not STEMI by  [place] Criteria. Initial troponin negative. CXR negative for evidence of mediastinal widening, pulmonary effusion, or cardiomegaly. Clinical picture not c/w aortic dissection or PE. 325mg  ASA administered. Initial troponin negative and pt remains chest pain free; NTG paste applied. Will admit for ACS rule out to Cardiology.         Clemetine Marker, MD 12/21/12 2249

## 2012-12-22 ENCOUNTER — Encounter (HOSPITAL_COMMUNITY): Payer: Self-pay | Admitting: Interventional Cardiology

## 2012-12-22 DIAGNOSIS — F17200 Nicotine dependence, unspecified, uncomplicated: Secondary | ICD-10-CM | POA: Insufficient documentation

## 2012-12-22 DIAGNOSIS — I456 Pre-excitation syndrome: Secondary | ICD-10-CM | POA: Insufficient documentation

## 2012-12-22 LAB — TROPONIN I
Troponin I: <0.3 ng/mL
Troponin I: <0.3 ng/mL (ref <0.30)

## 2012-12-22 LAB — LIPID PANEL
Cholesterol: 201 mg/dL — ABNORMAL HIGH (ref 0–200)
HDL: 69 mg/dL
LDL Cholesterol: 110 mg/dL — ABNORMAL HIGH (ref 0–99)
Total CHOL/HDL Ratio: 2.9 ratio
Triglycerides: 108 mg/dL
VLDL: 22 mg/dL (ref 0–40)

## 2012-12-22 LAB — BASIC METABOLIC PANEL
BUN: 15 mg/dL (ref 6–23)
Calcium: 9.3 mg/dL (ref 8.4–10.5)
Chloride: 107 mEq/L (ref 96–112)
Creatinine, Ser: 0.81 mg/dL (ref 0.50–1.10)
GFR calc Af Amer: 76 mL/min — ABNORMAL LOW (ref >90)
GFR calc non Af Amer: 66 mL/min — ABNORMAL LOW (ref >90)

## 2012-12-22 MED ORDER — OXYCODONE HCL 5 MG PO TABS: 15.0000 mg | ORAL_TABLET | Freq: Every day | ORAL | Status: DC

## 2012-12-22 MED ORDER — NITROGLYCERIN 0.4 MG SL SUBL: 0.4000 mg | SUBLINGUAL_TABLET | SUBLINGUAL | Status: AC | PRN

## 2012-12-22 MED ORDER — ASPIRIN 81 MG PO TBEC: 81.0000 mg | DELAYED_RELEASE_TABLET | Freq: Every day | ORAL | Status: DC

## 2012-12-22 NOTE — Discharge Summary (Signed)
Patient ID: [name] MRN: [id number] DOB/AGE: [birth year] 79 y.o.  Admit date: 12/21/2012 Discharge date: 12/22/2012  Primary Discharge Diagnosis Atypical chest pain Secondary Discharge Diagnosis Left bundle branch block, tobacco abuse, fatigue  Significant Diagnostic Studies: None  Consults: None  Hospital Course: 79 year old woman who has had a long history of tobacco abuse.  She came in after having some chest discomfort in the left side of her chest which radiated to her neck and in her left arm.  She took some expired nitroglycerin that she had from her deceased husband.  After 2 pills, her pain went away.  She was later told that the nitroglycerin was expired.  She did not feel that the pills stung under her tongue.  She has not had any further chest discomfort.  Her biggest complaint is generalized fatigue and chronic pain.  She has had neck surgery and wears a brace at times due to some screws that are in their.  Looking back at our office record, she has had these complaints since the summer of 2013.  She feels she cannot rest of the hospital.  She has been walking around her hospital room without any discomfort.  We discussed strategies of either performing stress test in the hospital or perhaps repeating it as an outpatient.  She had a stress test back in 2002.  This was apparently negative.  She has had a lower showing arterial Doppler showing only mild atherosclerosis.  She had an echocardiogram in July of 2013 in our office showing an ejection fraction of about 45-50%.  She preferred going home and having a stress test as an outpatient because she felt she could rest better at home.  She did not want to stay in the first place.  Given that she had a left bundle branch block on ECG which is unchanged from before, and that her enzymes have been negative for ischemia, we agreed that she could go home.  She will come back to the hospital she has any recurrence or persistence.  She will  followup with Dr. Verdis Prime who is her usual cardiologist.  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 [name].   Discharge Exam: Blood pressure 144/75, pulse 63, temperature 98.2 F (36.8 C), temperature source Oral, resp. rate 18, height 5' (1.524 m), weight 44.271 kg (97 lb 9.6 oz), SpO2 97.00%.  [place]/AT RRR, S1, S2 Neck brace in place No wheezing Nondistended No edema 2+ dorsalis pedis pulses bilaterally Labs:   Lab Results  Component Value Date   WBC 6.0 12/21/2012   HGB 13.9 12/21/2012   HCT 41.5 12/21/2012   MCV 92.0 12/21/2012   PLT 248 12/21/2012    Recent Labs Lab 12/22/12 0451  NA 141  K 4.5  CL 107  CO2 29  BUN 15  CREATININE 0.81  CALCIUM 9.3  GLUCOSE 76   Lab Results  Component Value Date   TROPONINI <0.30 12/22/2012    Lab Results  Component Value Date   CHOL 201* 12/22/2012   Lab Results  Component Value Date   HDL 69 12/22/2012   Lab Results  Component Value Date   LDLCALC 110* 12/22/2012   Lab Results  Component Value Date   TRIG 108 12/22/2012   Lab Results  Component Value Date   CHOLHDL 2.9 12/22/2012   No results found for this basename: LDLDIRECT      Radiology: No acute changes on chest x-ray EKG: Normal sinus rhythm,  left bundle branch block  FOLLOW UP PLANS AND APPOINTMENTS    Medication List    TAKE these medications       albuterol 108 (90 BASE) MCG/ACT inhaler  Commonly known as:  PROVENTIL HFA;VENTOLIN HFA  Inhale 1 puff into the lungs 2 (two) times daily.     aspirin 81 MG EC tablet  Take 1 tablet (81 mg total) by mouth daily.     carbidopa-levodopa 25-100 MG per tablet  Commonly known as:  SINEMET IR  Take 2 tablets by mouth at bedtime.     fentaNYL 25 MCG/HR  Commonly known as:  DURAGESIC - dosed mcg/hr  Place 1 patch onto the skin every other day.     LORazepam 1 MG tablet  Commonly known as:  ATIVAN  Take 0.5 mg by mouth 2 (two) times daily.     magnesium oxide 400 MG tablet   Commonly known as:  MAG-OX  Take 400 mg by mouth daily.     nitroGLYCERIN 0.4 MG SL tablet  Commonly known as:  NITROSTAT  Place 1 tablet (0.4 mg total) under the tongue every 5 (five) minutes x 3 doses as needed for chest pain.     Oxycodone HCl 10 MG Tabs  Take 15 mg by mouth at bedtime.     pantoprazole 40 MG tablet  Commonly known as:  PROTONIX  Take 40 mg by mouth 2 (two) times daily.     polyethylene glycol packet  Commonly known as:  MIRALAX / GLYCOLAX  Take 17 g by mouth every evening.     traZODone 50 MG tablet  Commonly known as:  DESYREL  Take 50 mg by mouth at bedtime.     Vitamin D3 2000 UNITS Tabs  Take 1 tablet by mouth daily.           Follow-up Information   Follow up with Lesleigh Noe, MD. Call in 2 days. (stress test)    Contact information:   [address] [place] [address] [phone]       BRING ALL MEDICATIONS WITH YOU TO FOLLOW UP APPOINTMENTS  Time spent with patient to include physician time: 35 minutes, going over options for care. SignedCorky Crafts. 12/22/2012, 9:28 AM

## 2012-12-22 NOTE — Progress Notes (Signed)
Pt c/o severe H/A, Tylenol given ,denies C/P nitro paste removed from LUA

## 2012-12-22 NOTE — Progress Notes (Signed)
Utilization review completed.  P.J. Jvon Meroney,RN,BSN Case Manager [phone]  

## 2013-01-15 ENCOUNTER — Other Ambulatory Visit: Payer: Self-pay | Admitting: Interventional Cardiology

## 2013-01-17 ENCOUNTER — Inpatient Hospital Stay (HOSPITAL_BASED_OUTPATIENT_CLINIC_OR_DEPARTMENT_OTHER)
Admission: RE | Admit: 2013-01-17 | Discharge: 2013-01-17 | Disposition: A | Discharge status: Home / Self Care | Payer: Medicare Other | Source: Ambulatory Visit | Attending: Interventional Cardiology | Admitting: Interventional Cardiology

## 2013-01-17 ENCOUNTER — Encounter (HOSPITAL_BASED_OUTPATIENT_CLINIC_OR_DEPARTMENT_OTHER)
Admission: RE | Disposition: A | Discharge status: Home / Self Care | Payer: Self-pay | Source: Ambulatory Visit | Attending: Interventional Cardiology

## 2013-01-17 DIAGNOSIS — R0989 Other specified symptoms and signs involving the circulatory and respiratory systems: Secondary | ICD-10-CM | POA: Insufficient documentation

## 2013-01-17 DIAGNOSIS — I5041 Acute combined systolic (congestive) and diastolic (congestive) heart failure: Secondary | ICD-10-CM | POA: Diagnosis present

## 2013-01-17 DIAGNOSIS — F17200 Nicotine dependence, unspecified, uncomplicated: Secondary | ICD-10-CM | POA: Insufficient documentation

## 2013-01-17 DIAGNOSIS — I5042 Chronic combined systolic (congestive) and diastolic (congestive) heart failure: Secondary | ICD-10-CM | POA: Insufficient documentation

## 2013-01-17 DIAGNOSIS — R0789 Other chest pain: Secondary | ICD-10-CM | POA: Insufficient documentation

## 2013-01-17 DIAGNOSIS — R9439 Abnormal result of other cardiovascular function study: Secondary | ICD-10-CM | POA: Diagnosis present

## 2013-01-17 DIAGNOSIS — I428 Other cardiomyopathies: Secondary | ICD-10-CM | POA: Insufficient documentation

## 2013-01-17 DIAGNOSIS — R0609 Other forms of dyspnea: Secondary | ICD-10-CM | POA: Insufficient documentation

## 2013-01-17 HISTORY — DX: Abnormal result of other cardiovascular function study: R94.39

## 2013-01-17 HISTORY — DX: Acute combined systolic (congestive) and diastolic (congestive) heart failure: I50.41

## 2013-01-17 SURGERY — JV LEFT HEART CATHETERIZATION WITH CORONARY ANGIOGRAM
Anesthesia: Moderate Sedation

## 2013-01-17 MED ORDER — ACETAMINOPHEN 325 MG PO TABS: 650.0000 mg | ORAL_TABLET | ORAL | Status: DC | PRN

## 2013-01-17 MED ORDER — SODIUM CHLORIDE 0.9 % IV SOLN: INTRAVENOUS | Status: AC

## 2013-01-17 MED ORDER — SODIUM CHLORIDE 0.9 % IV SOLN
INTRAVENOUS | Status: DC
  Administered 2013-01-17: 10:00:00 via INTRAVENOUS

## 2013-01-17 MED ORDER — ONDANSETRON HCL 4 MG/2ML IJ SOLN: 4.0000 mg | Freq: Four times a day (QID) | INTRAMUSCULAR | Status: DC | PRN

## 2013-01-17 NOTE — CV Procedure (Signed)
     Diagnostic Cardiac Catheterization Report  [name]  79 y.o.  female 09-28-1928  Procedure Date: 01/17/2013 Referring Physician: Johnella Moloney, M.D. Primary Cardiologist:: HWB Leia Alf, M.D.   PROCEDURE:  Left heart catheterization with selective coronary angiography, left ventriculogram.  INDICATIONS:  The indication in this 79 year old is of recurring chest discomfort with atypical features and an abnormal nuclear study that demonstrated ischemic dilatation and global hypokinesis with EF 43% . There was also mid to distal anterior wall redistribution . The studies being done to define coronary anatomy and rule out coronary disease as the cause of her chest pain and abnormal LV function.  The risks, benefits, and details of the procedure were explained to the patient.  The patient verbalized understanding and wanted to proceed.  Informed written consent was obtained.  PROCEDURE TECHNIQUE:  After Xylocaine anesthesia a 4 French sheath sheath was placed in the right femoral artery with a single anterior needle wall stick.   Coronary angiography was done using a 4 French A2 MP and JL 4 diagnostic catheter.  Left ventriculography was done using a 4 French A2 MP. catheter.    CONTRAST:  Total of 70 cc.  COMPLICATIONS:  None.    HEMODYNAMICS:  Aortic pressure was 159/72 balloon meters mercury; LV pressure was 171/18 mmHg; LVEDP 20 mm mercury.  There was a 12 mm gradient between the left ventricle and aorta.    ANGIOGRAPHIC DATA:   The left main coronary artery is dilated and widely patent.  The left anterior descending artery is widely patent. The distal vessel reaches the left ventricular apex. Gives origin to one large diagonal branch. Minimal luminal irregularities are noted.   The left circumflex artery is widely patent. No high-grade obstruction is seen. 2 obtuse marginal branches arise from the circumflex. No significant obstruction  The right coronary artery is  dominant and widely patent. The PDA reaches the left ventricular apex.  LEFT VENTRICULOGRAM:  Left ventricular angiogram was done in the 30 RAO projection and revealed global hypokinesis with an estimated ejection fraction of 40%.  LVEDP was 20 mmHg.  IMPRESSIONS:  1. Mild to moderate reduction in ejection fraction with elevated left ventricular end-diastolic pressure consistent with chronic combined systolic and diastolic heart failure. Findings are consistent with nonischemic cardiomyopathy  2. Widely patent coronary arteries.   RECOMMENDATION:  Aggressive heart failure therapy to include ACE/R. therapy and beta blocker therapy as tolerated 2 preserve  left ventricular function.Marland Kitchen

## 2013-01-17 NOTE — H&P (Addendum)
The patient has had dyspnea and a recent nuclear study demonstrated ischemic dilatation suggestive of three-vessel coronary disease. She has multiple risk factors for coronary disease including continued smoking. This diagnostic catheterization is being performed to define coronary anatomy and help guide therapy .  Please see the scanned records from [facility] cardiology for further details.  The risks and nature of the procedure were discussed with the patient in detail who accepts the procedure and is willing to proceed.

## 2013-01-17 NOTE — OR Nursing (Signed)
Meal served 

## 2013-01-17 NOTE — OR Nursing (Signed)
Tegaderm dressing applied, site level 0, bedrest begins at 1115

## 2013-04-09 ENCOUNTER — Other Ambulatory Visit: Payer: Self-pay | Admitting: Gastroenterology

## 2013-04-09 DIAGNOSIS — R1310 Dysphagia, unspecified: Secondary | ICD-10-CM

## 2013-04-18 ENCOUNTER — Ambulatory Visit
Admission: RE | Admit: 2013-04-18 | Discharge: 2013-04-18 | Disposition: A | Discharge status: Home / Self Care | Payer: Medicare Other | Source: Ambulatory Visit | Attending: Gastroenterology | Admitting: Gastroenterology

## 2013-04-18 DIAGNOSIS — R1310 Dysphagia, unspecified: Secondary | ICD-10-CM

## 2013-07-01 ENCOUNTER — Other Ambulatory Visit: Payer: Self-pay

## 2013-07-01 DIAGNOSIS — Z1231 Encounter for screening mammogram for malignant neoplasm of breast: Secondary | ICD-10-CM

## 2013-08-01 ENCOUNTER — Ambulatory Visit
Admission: RE | Admit: 2013-08-01 | Discharge: 2013-08-01 | Disposition: A | Discharge status: Home / Self Care | Payer: Medicare Other | Source: Ambulatory Visit

## 2013-08-01 DIAGNOSIS — Z1231 Encounter for screening mammogram for malignant neoplasm of breast: Secondary | ICD-10-CM

## 2014-04-25 ENCOUNTER — Emergency Department (HOSPITAL_COMMUNITY)
Admission: EM | Admit: 2014-04-25 | Discharge: 2014-04-25 | Disposition: A | Discharge status: Home / Self Care | Payer: Medicare Other | Attending: Emergency Medicine | Admitting: Emergency Medicine

## 2014-04-25 ENCOUNTER — Emergency Department (HOSPITAL_COMMUNITY): Payer: Medicare Other

## 2014-04-25 ENCOUNTER — Encounter (HOSPITAL_COMMUNITY): Payer: Self-pay | Admitting: Emergency Medicine

## 2014-04-25 DIAGNOSIS — Z7982 Long term (current) use of aspirin: Secondary | ICD-10-CM | POA: Diagnosis not present

## 2014-04-25 DIAGNOSIS — Z79899 Other long term (current) drug therapy: Secondary | ICD-10-CM | POA: Insufficient documentation

## 2014-04-25 DIAGNOSIS — G2581 Restless legs syndrome: Secondary | ICD-10-CM | POA: Insufficient documentation

## 2014-04-25 DIAGNOSIS — I10 Essential (primary) hypertension: Secondary | ICD-10-CM | POA: Diagnosis not present

## 2014-04-25 DIAGNOSIS — J449 Chronic obstructive pulmonary disease, unspecified: Secondary | ICD-10-CM | POA: Diagnosis not present

## 2014-04-25 DIAGNOSIS — R404 Transient alteration of awareness: Secondary | ICD-10-CM | POA: Insufficient documentation

## 2014-04-25 DIAGNOSIS — Z880 Allergy status to penicillin: Secondary | ICD-10-CM | POA: Diagnosis not present

## 2014-04-25 DIAGNOSIS — F4489 Other dissociative and conversion disorders: Secondary | ICD-10-CM | POA: Insufficient documentation

## 2014-04-25 DIAGNOSIS — R410 Disorientation, unspecified: Secondary | ICD-10-CM

## 2014-04-25 DIAGNOSIS — F17200 Nicotine dependence, unspecified, uncomplicated: Secondary | ICD-10-CM | POA: Diagnosis not present

## 2014-04-25 DIAGNOSIS — J4489 Other specified chronic obstructive pulmonary disease: Secondary | ICD-10-CM | POA: Insufficient documentation

## 2014-04-25 LAB — CBC
HEMATOCRIT: 42.3 % (ref 36.0–46.0)
Hemoglobin: 13.7 g/dL (ref 12.0–15.0)
MCH: 30.8 pg (ref 26.0–34.0)
MCHC: 32.4 g/dL (ref 30.0–36.0)
MCV: 95.1 fL (ref 78.0–100.0)
PLATELETS: 276 10*3/uL (ref 150–400)
RBC: 4.45 MIL/uL (ref 3.87–5.11)
RDW: 13.1 % (ref 11.5–15.5)
WBC: 5.2 10*3/uL (ref 4.0–10.5)

## 2014-04-25 LAB — COMPREHENSIVE METABOLIC PANEL
ALT: 21 U/L (ref 0–35)
ANION GAP: 12 (ref 5–15)
AST: 29 U/L (ref 0–37)
Albumin: 3.8 g/dL (ref 3.5–5.2)
Alkaline Phosphatase: 64 U/L (ref 39–117)
BILIRUBIN TOTAL: 0.3 mg/dL (ref 0.3–1.2)
BUN: 15 mg/dL (ref 6–23)
CALCIUM: 9.5 mg/dL (ref 8.4–10.5)
CHLORIDE: 105 meq/L (ref 96–112)
CO2: 27 meq/L (ref 19–32)
CREATININE: 0.97 mg/dL (ref 0.50–1.10)
GFR, EST AFRICAN AMERICAN: 61 mL/min — AB (ref >90)
GFR, EST NON AFRICAN AMERICAN: 53 mL/min — AB (ref >90)
GLUCOSE: 94 mg/dL (ref 70–99)
Potassium: 4.8 mEq/L (ref 3.7–5.3)
Sodium: 144 mEq/L (ref 137–147)
Total Protein: 7 g/dL (ref 6.0–8.3)

## 2014-04-25 LAB — URINALYSIS, ROUTINE W REFLEX MICROSCOPIC
GLUCOSE, UA: NEGATIVE mg/dL
KETONES UR: 15 mg/dL — AB
LEUKOCYTES UA: NEGATIVE
Nitrite: NEGATIVE
PROTEIN: 30 mg/dL — AB
Specific Gravity, Urine: 1.023 (ref 1.005–1.030)
UROBILINOGEN UA: 0.2 mg/dL (ref 0.0–1.0)
pH: 5 (ref 5.0–8.0)

## 2014-04-25 LAB — URINE MICROSCOPIC-ADD ON

## 2014-04-25 LAB — I-STAT ARTERIAL BLOOD GAS, ED
Acid-Base Excess: 3 mmol/L — ABNORMAL HIGH (ref 0.0–2.0)
Bicarbonate: 25.9 mEq/L — ABNORMAL HIGH (ref 20.0–24.0)
O2 Saturation: 95 %
PH ART: 7.503 — AB (ref 7.350–7.450)
TCO2: 27 mmol/L (ref 0–100)
pCO2 arterial: 32.9 mmHg — ABNORMAL LOW (ref 35.0–45.0)
pO2, Arterial: 65 mmHg — ABNORMAL LOW (ref 80.0–100.0)

## 2014-04-25 LAB — I-STAT TROPONIN, ED: TROPONIN I, POC: 0.04 ng/mL (ref 0.00–0.08)

## 2014-04-25 LAB — TROPONIN I: Troponin I: <0.3 ng/mL (ref <0.30)

## 2014-04-25 MED ORDER — IPRATROPIUM-ALBUTEROL 0.5-2.5 (3) MG/3ML IN SOLN
3.0000 mL | Freq: Once | RESPIRATORY_TRACT | Status: AC
  Administered 2014-04-25: 3 mL via RESPIRATORY_TRACT
  Filled 2014-04-25: qty 3

## 2014-04-25 MED ORDER — LORAZEPAM 1 MG PO TABS
0.5000 mg | ORAL_TABLET | Freq: Once | ORAL | Status: AC
  Administered 2014-04-25: 0.5 mg via ORAL
  Filled 2014-04-25: qty 1

## 2014-04-25 NOTE — ED Provider Notes (Signed)
CSN: [id number]     Arrival date & time 04/25/14  1629 History   First MD Initiated Contact with Patient 04/25/14 1755     Chief Complaint  Patient presents with  . Altered Mental Status     (Consider location/radiation/quality/duration/timing/severity/associated sxs/prior Treatment) HPI Comments: Patient presents to ER for evaluation of mental status changes. Patient is accompanied by her daughter. Patient reports that she feels like she is confused today, can't concentrate. Daughter corroborates this. She reportedly had an episode of acute onset confusion several days ago and it lasted approximately 2 hours and resolved. In the last 2 days she has had intermittent episodes of seeming confused, but today she has not been acting like her normal self all day long. She has been confused, disoriented, agitated.  Patient is a 80 y.o. female presenting with altered mental status.  Altered Mental Status Presenting symptoms: confusion   Associated symptoms: no headaches and no seizures     Past Medical History  Diagnosis Date  . COPD (chronic obstructive pulmonary disease)   . Hypertension   . Restless leg syndrome   . Tobacco use disorder   . WPW (Wolff-Parkinson-White syndrome)    Past Surgical History  Procedure Laterality Date  . Tonsillectomy    . Appendectomy    . Hemangioma excision    . Kidney surgery      RT KIDNEY TACK  . Neck surgery    . Back surgery      MULTIPLE   History reviewed. No pertinent family history. History  Substance Use Topics  . Smoking status: Current Every Day Smoker  . Smokeless tobacco: Not on file  . Alcohol Use: No   OB History   Grav Para Term Preterm Abortions TAB SAB Ect Mult Living                 Review of Systems  Neurological: Negative for seizures, syncope and headaches.  Psychiatric/Behavioral: Positive for confusion.  All other systems reviewed and are negative.     Allergies  Amoxicillin; Ciprofloxacin; Lorabid; Avelox;  Erythromycin; and Inderal  Home Medications   Prior to Admission medications   Medication Sig Start Date End Date Taking? Authorizing Provider  acetaminophen (TYLENOL) 500 MG tablet Take 500 mg by mouth every 6 (six) hours as needed for moderate pain.   Yes Historical Provider, MD  albuterol (PROVENTIL) (2.5 MG/3ML) 0.083% nebulizer solution Take 2.5 mg by nebulization 3 (three) times daily.   Yes Historical Provider, MD  aspirin EC 81 MG EC tablet Take 1 tablet (81 mg total) by mouth daily. 12/22/12  Yes Corky Crafts, MD  budesonide-formoterol [facility]) 80-4.5 MCG/ACT inhaler Inhale 2 puffs into the lungs 2 (two) times daily.   Yes Historical Provider, MD  CALCIUM-VITAMIN D PO Take 1 tablet by mouth daily.   Yes Historical Provider, MD  fentaNYL (DURAGESIC - DOSED MCG/HR) 25 MCG/HR Place 1 patch onto the skin every other day.   Yes Historical Provider, MD  Homeopathic Products (LEG CRAMP RELIEF) TABS Take 1 tablet by mouth at bedtime.   Yes Historical Provider, MD  LORazepam (ATIVAN) 1 MG tablet Take 0.5 mg by mouth 3 (three) times daily.    Yes Historical Provider, MD  losartan (COZAAR) 25 MG tablet Take 25 mg by mouth daily.   Yes Historical Provider, MD  Magnesium 500 MG TABS Take 500 mg by mouth daily.   Yes Historical Provider, MD  pantoprazole (PROTONIX) 40 MG tablet Take 40 mg by mouth 2 (two)  times daily.   Yes Historical Provider, MD  polyethylene glycol (MIRALAX / GLYCOLAX) packet Take 17 g by mouth every evening.    Yes Historical Provider, MD  Potassium Gluconate 550 MG TABS Take 550 mg by mouth daily.   Yes Historical Provider, MD  pramipexole (MIRAPEX) 0.75 MG tablet Take 0.75 mg by mouth at bedtime.   Yes Historical Provider, MD  Probiotic Product (PROBIOTIC DAILY) CAPS Take 1 capsule by mouth daily.   Yes Historical Provider, MD  vitamin B-12 (CYANOCOBALAMIN) 100 MCG tablet Take 100 mcg by mouth daily.   Yes Historical Provider, MD  nitroGLYCERIN (NITROSTAT) 0.4 MG SL  tablet Place 1 tablet (0.4 mg total) under the tongue every 5 (five) minutes x 3 doses as needed for chest pain. 12/22/12   Corky Crafts, MD   BP 113/64  Pulse 84  Temp(Src) 98.1 F (36.7 C) (Oral)  Resp 18  SpO2 100% Physical Exam  Constitutional: She is oriented to person, place, and time. She appears well-developed and well-nourished. No distress.  HENT:  Head: Normocephalic and atraumatic.  Right Ear: Hearing normal.  Left Ear: Hearing normal.  Nose: Nose normal.  Mouth/Throat: Oropharynx is clear and moist and mucous membranes are normal.  Eyes: Conjunctivae and EOM are normal. Pupils are equal, round, and reactive to light.  Neck: Normal range of motion. Neck supple.  Cardiovascular: Regular rhythm, S1 normal and S2 normal.  Exam reveals no gallop and no friction rub.   No murmur heard. Pulmonary/Chest: Effort normal and breath sounds normal. No respiratory distress. She exhibits no tenderness.  Abdominal: Soft. Normal appearance and bowel sounds are normal. There is no hepatosplenomegaly. There is no tenderness. There is no rebound, no guarding, no tenderness at McBurney's point and negative Murphy's sign. No hernia.  Musculoskeletal: Normal range of motion.  Neurological: She is alert and oriented to person, place, and time. She has normal strength. No cranial nerve deficit or sensory deficit. Coordination normal. GCS eye subscore is 4. GCS verbal subscore is 5. GCS motor subscore is 6.  Extraocular muscle movement: normal No visual field cut Pupils: equal and reactive both direct and consensual response is normal No nystagmus present   Sensory function is intact to light touch, pinprick Proprioception intact  Grip strength 5/5 symmetric in upper extremities Lower extremity strength 5/5 against gravity No pronator drift Normal finger to nose bilaterally Normal heel to shin bilaterally     Skin: Skin is warm, dry and intact. No rash noted. No cyanosis.   Psychiatric: She has a normal mood and affect. Her speech is normal and behavior is normal. Thought content normal.    ED Course  Procedures (including critical care time) Labs Review Labs Reviewed  COMPREHENSIVE METABOLIC PANEL - Abnormal; Notable for the following:    GFR calc non Af Amer 53 (*)    GFR calc Af Amer 61 (*)    All other components within normal limits  URINALYSIS, ROUTINE W REFLEX MICROSCOPIC - Abnormal; Notable for the following:    Color, Urine AMBER (*)    Hgb urine dipstick TRACE (*)    Bilirubin Urine SMALL (*)    Ketones, ur 15 (*)    Protein, ur 30 (*)    All other components within normal limits  URINE MICROSCOPIC-ADD ON - Abnormal; Notable for the following:    Squamous Epithelial / LPF FEW (*)    All other components within normal limits  I-STAT ARTERIAL BLOOD GAS, ED - Abnormal; Notable for the  following:    pH, Arterial 7.503 (*)    pCO2 arterial 32.9 (*)    pO2, Arterial 65.0 (*)    Bicarbonate 25.9 (*)    Acid-Base Excess 3.0 (*)    All other components within normal limits  CBC  TROPONIN I  Rosezena Sensor, ED    Imaging Review Dg Chest 2 View  04/25/2014   CLINICAL DATA:  Confusion and headache.  EXAM: CHEST  2 VIEW  COMPARISON:  12/21/2012 and 10/30/2012 .  FINDINGS: Mediastinum and hilar structures are normal. Nodular densities noted over both lung bases consistent with nipple shadows, these are noted on prior studies. Cardiomegaly with normal pulmonary vascularity. No evidence of overt congestive heart failure. No pleural effusion or pneumothorax. Neurostimulator noted with tips over the mid thoracic spine. Degenerative changes thoracic spine and both shoulders.  IMPRESSION: 1. Cardiomegaly, no CHF. 2. No acute pulmonary disease. 3. Neurostimulator noted with tips over the mid thoracic spine.   Electronically Signed   By: Maisie Fus  Register   On: 04/25/2014 19:36   Ct Head Wo Contrast  04/25/2014   CLINICAL DATA:  Fatigue and confusion.   EXAM: CT HEAD WITHOUT CONTRAST  TECHNIQUE: Contiguous axial images were obtained from the base of the skull through the vertex without intravenous contrast.  COMPARISON:  07/13/2010.  FINDINGS: No intra-axial or extra-axial calcified fluid-like collection. Diffuse cerebral and cerebellar atrophy. White matter changes consistent with chronic ischemia. No acute bony abnormality. Mild mucosal thickening maxillary sinuses.  IMPRESSION: No acute abnormality. Chronic white matter ischemic change. Diffuse cerebral and cerebellar atrophy. Head CT is stable from 07/13/2010.   Electronically Signed   By: Maisie Fus  Register   On: 04/25/2014 19:12     EKG Interpretation   Date/Time:  Friday April 25 2014 16:42:10 EDT Ventricular Rate:  101 PR Interval:  144 QRS Duration: 128 QT Interval:  404 QTC Calculation: 523 R Axis:   -87 Text Interpretation:  Sinus tachycardia with frequent Premature  ventricular complexes and Fusion complexes Left axis deviation  Non-specific intra-ventricular conduction block T wave abnormality,  consider lateral ischemia Abnormal ECG Confirmed by POLLINA  MD,  CHRISTOPHER ([id number]) on 04/25/2014 7:31:15 PM      MDM   Final diagnoses:  None   confusion, resolved  Patient presents to the ER for evaluation of altered mental status. She is accompanied by her daughter. Here in the ER, her daughter agrees that the patient has been at approximately her mental baseline. She seems to have improved. I did not find any focal neurologic findings. Patient's workup was unremarkable. CT head showing acute findings. Chest x-ray clear. She did start to have some shortness of breath here in the ER, has chronic lung disease. A blood gas was performed to rule out CO2 retention, no significant attention was seen. Patient feels much better after nebulizer treatment. She did have some anxiety and was given Ativan with significant improvement. Presentation is likely multifactorial. Patient on multiple  medications that might have affected including fentanyl, Ativan. She will follow up with her doctor Monday for a recheck. Return to the ER if there is any worsening in her condition.    Gilda Crease, MD 04/25/14 2152

## 2014-04-25 NOTE — ED Notes (Addendum)
Pt c/o feeling tired, especially at night. Pt sts she has just wanted to sleep a lot lately but didn't sleep at all last night. Pt's daughter reports today the pt is more altered than normal, talking to people that aren't really here and forgetting things more often than normal and unable to recall names that she would normally know. sts she went to check on pt this morning and then the pt was doing much better, then about an hour after she left the pt's neighbors called and said she was wandering the neighborhood. sts last week the pt called her c/o nausea but then was having a hard time remembering why she called and what to do next. sts that she doesn't think she is being completely compliant with medications and they are trying to get medications into a pill box but the pt hasn't wanted this and is still keeping pills in each pill bottle but family has found lids/medications in the wrong bottle. Nad, skin warm and dry, resp e/u.

## 2014-04-25 NOTE — ED Notes (Signed)
Dr. Pollina at bedside   

## 2014-04-25 NOTE — Discharge Instructions (Signed)
Followup with your doctor Monday for a recheck. Return to the ER sooner if you have any worsening problems.  Confusion Confusion is the inability to think with your usual speed or clarity. Confusion may come on quickly or slowly over time. How quickly the confusion comes on depends on the cause. Confusion can be due to any number of causes. CAUSES   Concussion, head injury, or head trauma.  Seizures.  Stroke.  Fever.  Brain tumor.  Age related decreased brain function (dementia).  Heightened emotional states like rage or terror.  Mental illness in which the person loses the ability to determine what is real and what is not (hallucinations).  Infections such as a urinary tract infection (UTI).  Toxic effects from alcohol, drugs, or prescription medicines.  Dehydration and an imbalance of salts in the body (electrolytes).  Lack of sleep.  Low blood sugar (diabetes).  Low levels of oxygen from conditions such as chronic lung disorders.  Drug interactions or other medicine side effects.  Nutritional deficiencies, especially niacin, thiamine, vitamin C, or vitamin B.  Sudden drop in body temperature (hypothermia).  Change in routine, such as when traveling or hospitalized. SIGNS AND SYMPTOMS  People often describe their thinking as cloudy or unclear when they are confused. Confusion can also include feeling disoriented. That means you are unaware of where or who you are. You may also not know what the date or time is. If confused, you may also have difficulty paying attention, remembering, and making decisions. Some people also act aggressively when they are confused.  DIAGNOSIS  The medical evaluation of confusion may include:  Blood and urine tests.  X-rays.  Brain and nervous system tests.  Analyzing your brain waves (electroencephalogram or EEG).  Magnetic resonance imaging (MRI) of your head.  Computed tomography (CT) scan of your head.  Mental status tests  in which your health care provider may ask many questions. Some of these questions may seem silly or strange, but they are a very important test to help diagnose and treat confusion. TREATMENT  An admission to the hospital may not be needed, but a person with confusion should not be left alone. Stay with a family member or friend until the confusion clears. Avoid alcohol, pain relievers, or sedative drugs until you have fully recovered. Do not drive until directed by your health care provider. HOME CARE INSTRUCTIONS  What family and friends can do:  To find out if someone is confused, ask the person to state his or her name, age, and the date. If the person is unsure or answers incorrectly, he or she is confused.  Always introduce yourself, no matter how well the person knows you.  Often remind the person of his or her location.  Place a calendar and clock near the confused person.  Help the person with his or her medicines. You may want to use a pill box, an alarm as a reminder, or give the person each dose as prescribed.  Talk about current events and plans for the day.  Try to keep the environment calm, quiet, and peaceful.  Make sure the person keeps follow-up visits with his or her health care provider. PREVENTION  Ways to prevent confusion:  Avoid alcohol.  Eat a balanced diet.  Get enough sleep.  Take medicine only as directed by your health care provider.  Do not become isolated. Spend time with other people and make plans for your days.  Keep careful watch on your blood sugar levels  if you are diabetic. SEEK IMMEDIATE MEDICAL CARE IF:   You develop severe headaches, repeated vomiting, seizures, blackouts, or slurred speech.  There is increasing confusion, weakness, numbness, restlessness, or personality changes.  You develop a loss of balance, have marked dizziness, feel uncoordinated, or fall.  You have delusions, hallucinations, or develop severe anxiety.  Your  family members think you need to be rechecked. Document Released: 09/22/2004 Document Revised: 12/30/2013 Document Reviewed: 09/20/2013 [facility] Patient Information 2015 [place], [place]. This information is not intended to replace advice given to you by your health care provider. Make sure you discuss any questions you have with your health care provider.

## 2014-04-25 NOTE — ED Notes (Addendum)
Pt reports feeling very tired and fatigued. Family reports pt being confused since this am. Did not sleep well last night, altered today. Family reports pt had an episode of confusion over a week ago that resolved over several house. They spoke with pt on phone last night and she seemed normal at that time.  Family sorts pts medications into pill boxes but pt had messed with the bottles, so unsure if she took correct meds or dosages.

## 2014-06-09 ENCOUNTER — Telehealth: Payer: Self-pay | Admitting: Interventional Cardiology

## 2014-06-09 NOTE — Telephone Encounter (Signed)
I spoke with the pt's daughter and the pt has had swollen feet and ankles for several weeks. The pt saw Dr Kevan NyGates on 05/23/14 and he started lasix 20mg  three times a week.  The pt has cut back on the salt in her diet and she keeps her feet elevated when sitting down. The pt does have SOB and last month Dr Kevan NyGates prescribed a nebulizer treatment three times a day.  The pt's breathing is effected by temperature change and she develops wheezing. The pt does have bulging veins in her ankle and the veins cause pain.  I recommended that the pt try OTC compression stockings. The pt's daughter will try stockings and the pt will follow-up with Dr Kevan NyGates later this month.  If the pt is still having issues after seeing Dr Kevan NyGates then the pt's daughter will arrange follow-up with Dr Katrinka BlazingSmith.

## 2014-06-09 NOTE — Telephone Encounter (Signed)
New Message  Pt daughter called states that pt's feet and ankles are swollen. Requests a call back to discuss.

## 2015-02-25 ENCOUNTER — Other Ambulatory Visit (HOSPITAL_COMMUNITY): Payer: Self-pay | Admitting: Ophthalmology

## 2015-02-25 DIAGNOSIS — H4921 Sixth [abducent] nerve palsy, right eye: Secondary | ICD-10-CM

## 2015-03-04 ENCOUNTER — Ambulatory Visit (HOSPITAL_COMMUNITY)
Admission: RE | Admit: 2015-03-04 | Discharge: 2015-03-04 | Disposition: A | Discharge status: Home / Self Care | Payer: Medicare Other | Source: Ambulatory Visit | Attending: Ophthalmology | Admitting: Ophthalmology

## 2015-03-04 ENCOUNTER — Encounter (HOSPITAL_COMMUNITY): Payer: Self-pay

## 2015-03-04 DIAGNOSIS — I6782 Cerebral ischemia: Secondary | ICD-10-CM | POA: Diagnosis not present

## 2015-03-04 DIAGNOSIS — H4921 Sixth [abducent] nerve palsy, right eye: Secondary | ICD-10-CM

## 2015-03-04 DIAGNOSIS — G319 Degenerative disease of nervous system, unspecified: Secondary | ICD-10-CM | POA: Diagnosis not present

## 2015-03-04 LAB — POCT I-STAT CREATININE: Creatinine, Ser: 1.1 mg/dL — ABNORMAL HIGH (ref 0.44–1.00)

## 2015-03-04 MED ORDER — IOHEXOL 350 MG/ML SOLN
50.0000 mL | Freq: Once | INTRAVENOUS | Status: AC | PRN
  Administered 2015-03-04: 50 mL via INTRAVENOUS

## 2017-08-17 ENCOUNTER — Emergency Department (HOSPITAL_COMMUNITY): Payer: Medicare Other

## 2017-08-17 ENCOUNTER — Encounter (HOSPITAL_COMMUNITY): Payer: Self-pay | Admitting: Emergency Medicine

## 2017-08-17 ENCOUNTER — Other Ambulatory Visit: Payer: Self-pay

## 2017-08-17 ENCOUNTER — Inpatient Hospital Stay (HOSPITAL_COMMUNITY)
Admission: EM | Admit: 2017-08-17 | Discharge: 2017-08-23 | DRG: 871 | Disposition: A | Discharge status: Skilled Nursing Facility | Payer: Medicare Other | Attending: Internal Medicine | Admitting: Internal Medicine

## 2017-08-17 DIAGNOSIS — J440 Chronic obstructive pulmonary disease with acute lower respiratory infection: Secondary | ICD-10-CM | POA: Diagnosis present

## 2017-08-17 DIAGNOSIS — G2581 Restless legs syndrome: Secondary | ICD-10-CM | POA: Diagnosis present

## 2017-08-17 DIAGNOSIS — F0391 Unspecified dementia with behavioral disturbance: Secondary | ICD-10-CM | POA: Diagnosis present

## 2017-08-17 DIAGNOSIS — F17200 Nicotine dependence, unspecified, uncomplicated: Secondary | ICD-10-CM | POA: Diagnosis present

## 2017-08-17 DIAGNOSIS — I456 Pre-excitation syndrome: Secondary | ICD-10-CM | POA: Diagnosis present

## 2017-08-17 DIAGNOSIS — E876 Hypokalemia: Secondary | ICD-10-CM | POA: Diagnosis present

## 2017-08-17 DIAGNOSIS — J13 Pneumonia due to Streptococcus pneumoniae: Secondary | ICD-10-CM | POA: Diagnosis present

## 2017-08-17 DIAGNOSIS — I5042 Chronic combined systolic (congestive) and diastolic (congestive) heart failure: Secondary | ICD-10-CM | POA: Diagnosis present

## 2017-08-17 DIAGNOSIS — Z66 Do not resuscitate: Secondary | ICD-10-CM | POA: Diagnosis present

## 2017-08-17 DIAGNOSIS — Z79899 Other long term (current) drug therapy: Secondary | ICD-10-CM | POA: Diagnosis not present

## 2017-08-17 DIAGNOSIS — A419 Sepsis, unspecified organism: Secondary | ICD-10-CM

## 2017-08-17 DIAGNOSIS — J189 Pneumonia, unspecified organism: Secondary | ICD-10-CM

## 2017-08-17 DIAGNOSIS — I110 Hypertensive heart disease with heart failure: Secondary | ICD-10-CM | POA: Diagnosis present

## 2017-08-17 DIAGNOSIS — Z7951 Long term (current) use of inhaled steroids: Secondary | ICD-10-CM | POA: Diagnosis not present

## 2017-08-17 DIAGNOSIS — Z881 Allergy status to other antibiotic agents status: Secondary | ICD-10-CM | POA: Diagnosis not present

## 2017-08-17 DIAGNOSIS — Z87891 Personal history of nicotine dependence: Secondary | ICD-10-CM

## 2017-08-17 DIAGNOSIS — A403 Sepsis due to Streptococcus pneumoniae: Secondary | ICD-10-CM | POA: Diagnosis not present

## 2017-08-17 DIAGNOSIS — J181 Lobar pneumonia, unspecified organism: Secondary | ICD-10-CM

## 2017-08-17 DIAGNOSIS — Z7982 Long term (current) use of aspirin: Secondary | ICD-10-CM

## 2017-08-17 HISTORY — DX: Chronic combined systolic (congestive) and diastolic (congestive) heart failure: I50.42

## 2017-08-17 HISTORY — DX: Pneumonia, unspecified organism: J18.9

## 2017-08-17 HISTORY — DX: Sepsis, unspecified organism: A41.9

## 2017-08-17 LAB — CBC
HEMATOCRIT: 36.7 % (ref 36.0–46.0)
HEMOGLOBIN: 11.9 g/dL — AB (ref 12.0–15.0)
MCH: 30.7 pg (ref 26.0–34.0)
MCHC: 32.4 g/dL (ref 30.0–36.0)
MCV: 94.6 fL (ref 78.0–100.0)
PLATELETS: 230 10*3/uL (ref 150–400)
RBC: 3.88 MIL/uL (ref 3.87–5.11)
RDW: 13.3 % (ref 11.5–15.5)
WBC: 11.3 10*3/uL — AB (ref 4.0–10.5)

## 2017-08-17 LAB — COMPREHENSIVE METABOLIC PANEL
ALT: 19 U/L (ref 14–54)
ANION GAP: 9 (ref 5–15)
AST: 32 U/L (ref 15–41)
Albumin: 3.5 g/dL (ref 3.5–5.0)
Alkaline Phosphatase: 58 U/L (ref 38–126)
BUN: 25 mg/dL — ABNORMAL HIGH (ref 6–20)
CO2: 26 mmol/L (ref 22–32)
Calcium: 8.8 mg/dL — ABNORMAL LOW (ref 8.9–10.3)
Chloride: 105 mmol/L (ref 101–111)
Creatinine, Ser: 1.03 mg/dL — ABNORMAL HIGH (ref 0.44–1.00)
GFR, EST AFRICAN AMERICAN: 55 mL/min — AB (ref >60)
GFR, EST NON AFRICAN AMERICAN: 48 mL/min — AB (ref >60)
Glucose, Bld: 116 mg/dL — ABNORMAL HIGH (ref 65–99)
POTASSIUM: 3.7 mmol/L (ref 3.5–5.1)
SODIUM: 140 mmol/L (ref 135–145)
Total Bilirubin: 0.9 mg/dL (ref 0.3–1.2)
Total Protein: 7 g/dL (ref 6.5–8.1)

## 2017-08-17 LAB — I-STAT CG4 LACTIC ACID, ED
Lactic Acid, Venous: 2.19 mmol/L (ref 0.5–1.9)
Lactic Acid, Venous: 1.36 mmol/L (ref 0.5–1.9)

## 2017-08-17 LAB — URINALYSIS, ROUTINE W REFLEX MICROSCOPIC
BILIRUBIN URINE: NEGATIVE
GLUCOSE, UA: NEGATIVE mg/dL
HGB URINE DIPSTICK: NEGATIVE
Ketones, ur: NEGATIVE mg/dL
LEUKOCYTES UA: NEGATIVE
NITRITE: NEGATIVE
PH: 5 (ref 5.0–8.0)
Protein, ur: 30 mg/dL — AB
SPECIFIC GRAVITY, URINE: 1.023 (ref 1.005–1.030)
Squamous Epithelial / LPF: NONE SEEN

## 2017-08-17 LAB — STREP PNEUMONIAE URINARY ANTIGEN: Strep Pneumo Urinary Antigen: POSITIVE — AB

## 2017-08-17 LAB — INFLUENZA PANEL BY PCR (TYPE A & B)
INFLBPCR: NEGATIVE
Influenza A By PCR: NEGATIVE

## 2017-08-17 LAB — MRSA PCR SCREENING: MRSA by PCR: NEGATIVE

## 2017-08-17 LAB — CBG MONITORING, ED: Glucose-Capillary: 95 mg/dL (ref 65–99)

## 2017-08-17 LAB — PROTIME-INR
INR: 0.95
Prothrombin Time: 12.6 seconds (ref 11.4–15.2)

## 2017-08-17 MED ORDER — OSELTAMIVIR PHOSPHATE 75 MG PO CAPS: 75.0000 mg | ORAL_CAPSULE | Freq: Two times a day (BID) | ORAL | Status: DC

## 2017-08-17 MED ORDER — ENOXAPARIN SODIUM 30 MG/0.3ML ~~LOC~~ SOLN
30.0000 mg | SUBCUTANEOUS | Status: DC
Start: 2017-08-17 — End: 2017-08-23
  Administered 2017-08-17 – 2017-08-22 (×5): 30 mg via SUBCUTANEOUS
  Filled 2017-08-17 (×6): qty 0.3

## 2017-08-17 MED ORDER — ASPIRIN 81 MG PO TBEC: 81.0000 mg | DELAYED_RELEASE_TABLET | Freq: Every day | ORAL | Status: DC

## 2017-08-17 MED ORDER — METRONIDAZOLE IN NACL 5-0.79 MG/ML-% IV SOLN
500.0000 mg | INTRAVENOUS | Status: AC
  Administered 2017-08-17: 500 mg via INTRAVENOUS
  Filled 2017-08-17: qty 100

## 2017-08-17 MED ORDER — FENTANYL 25 MCG/HR TD PT72
25.0000 ug | MEDICATED_PATCH | TRANSDERMAL | Status: DC
  Administered 2017-08-17 – 2017-08-20 (×2): 25 ug via TRANSDERMAL
  Filled 2017-08-17 (×2): qty 1

## 2017-08-17 MED ORDER — FLUTICASONE FUROATE-VILANTEROL 200-25 MCG/INH IN AEPB
1.0000 | INHALATION_SPRAY | Freq: Every day | RESPIRATORY_TRACT | Status: DC
  Administered 2017-08-17 – 2017-08-21 (×5): 1 via RESPIRATORY_TRACT
  Filled 2017-08-17: qty 28

## 2017-08-17 MED ORDER — VITAMIN B-12 100 MCG PO TABS
100.0000 ug | ORAL_TABLET | Freq: Every day | ORAL | Status: DC
Start: 2017-08-17 — End: 2017-08-23
  Administered 2017-08-17 – 2017-08-23 (×7): 100 ug via ORAL
  Filled 2017-08-17 (×7): qty 1

## 2017-08-17 MED ORDER — METHOCARBAMOL 500 MG PO TABS: 250.0000 mg | ORAL_TABLET | Freq: Every evening | ORAL | Status: DC | PRN

## 2017-08-17 MED ORDER — ACETAMINOPHEN 325 MG PO TABS
650.0000 mg | ORAL_TABLET | Freq: Once | ORAL | Status: AC
  Administered 2017-08-17: 650 mg via ORAL
  Filled 2017-08-17: qty 2

## 2017-08-17 MED ORDER — SODIUM CHLORIDE 0.9 % IV BOLUS (SEPSIS)
1000.0000 mL | Freq: Once | INTRAVENOUS | Status: AC
  Administered 2017-08-17: 1000 mL via INTRAVENOUS

## 2017-08-17 MED ORDER — PANTOPRAZOLE SODIUM 40 MG PO TBEC
40.0000 mg | DELAYED_RELEASE_TABLET | Freq: Two times a day (BID) | ORAL | Status: DC
  Administered 2017-08-17 – 2017-08-23 (×12): 40 mg via ORAL
  Filled 2017-08-17 (×11): qty 1

## 2017-08-17 MED ORDER — ALBUTEROL SULFATE (2.5 MG/3ML) 0.083% IN NEBU
2.5000 mg | INHALATION_SOLUTION | Freq: Three times a day (TID) | RESPIRATORY_TRACT | Status: DC | PRN
  Administered 2017-08-18 (×2): 2.5 mg via RESPIRATORY_TRACT
  Filled 2017-08-17 (×2): qty 3

## 2017-08-17 MED ORDER — VANCOMYCIN HCL IN DEXTROSE 750-5 MG/150ML-% IV SOLN
750.0000 mg | INTRAVENOUS | Status: DC
  Administered 2017-08-18 – 2017-08-20 (×2): 750 mg via INTRAVENOUS
  Filled 2017-08-17 (×2): qty 150

## 2017-08-17 MED ORDER — VITAMIN D3 25 MCG (1000 UT) PO CAPS: 1.0000 | ORAL_CAPSULE | Freq: Every day | ORAL | Status: DC

## 2017-08-17 MED ORDER — QUETIAPINE FUMARATE 100 MG PO TABS
100.0000 mg | ORAL_TABLET | ORAL | Status: DC
  Administered 2017-08-17 – 2017-08-22 (×4): 100 mg via ORAL
  Filled 2017-08-17 (×4): qty 1

## 2017-08-17 MED ORDER — PRAMIPEXOLE DIHYDROCHLORIDE 1 MG PO TABS
1.0000 mg | ORAL_TABLET | Freq: Every day | ORAL | Status: DC
  Administered 2017-08-17 – 2017-08-22 (×6): 1 mg via ORAL
  Filled 2017-08-17 (×3): qty 1
  Filled 2017-08-17 (×2): qty 4
  Filled 2017-08-17: qty 1
  Filled 2017-08-17: qty 4

## 2017-08-17 MED ORDER — ASPIRIN EC 81 MG PO TBEC
81.0000 mg | DELAYED_RELEASE_TABLET | Freq: Every day | ORAL | Status: DC
  Administered 2017-08-17 – 2017-08-23 (×7): 81 mg via ORAL
  Filled 2017-08-17 (×7): qty 1

## 2017-08-17 MED ORDER — VITAMIN D3 25 MCG (1000 UNIT) PO TABS
1000.0000 [IU] | ORAL_TABLET | Freq: Every day | ORAL | Status: DC
Start: 2017-08-17 — End: 2017-08-23
  Administered 2017-08-17 – 2017-08-23 (×7): 1000 [IU] via ORAL
  Filled 2017-08-17 (×7): qty 1

## 2017-08-17 MED ORDER — B COMPLEX-B12 PO TABS: 1.0000 | ORAL_TABLET | Freq: Every day | ORAL | Status: DC

## 2017-08-17 MED ORDER — GUAIFENESIN-DM 100-10 MG/5ML PO SYRP
5.0000 mL | ORAL_SOLUTION | ORAL | Status: DC | PRN
  Administered 2017-08-17: 5 mL via ORAL
  Filled 2017-08-17: qty 10

## 2017-08-17 MED ORDER — LORAZEPAM 0.5 MG PO TABS
0.5000 mg | ORAL_TABLET | Freq: Two times a day (BID) | ORAL | Status: DC
  Administered 2017-08-17 – 2017-08-23 (×11): 0.5 mg via ORAL
  Filled 2017-08-17 (×12): qty 1

## 2017-08-17 MED ORDER — PRAMIPEXOLE DIHYDROCHLORIDE 0.25 MG PO TABS
0.5000 mg | ORAL_TABLET | Freq: Every day | ORAL | Status: DC | PRN
  Filled 2017-08-17: qty 2

## 2017-08-17 MED ORDER — DEXTROSE 5 % IV SOLN
1.0000 g | INTRAVENOUS | Status: AC
  Administered 2017-08-17: 1 g via INTRAVENOUS
  Filled 2017-08-17: qty 1

## 2017-08-17 MED ORDER — DOXYCYCLINE HYCLATE 100 MG IV SOLR
100.0000 mg | INTRAVENOUS | Status: AC
  Administered 2017-08-17: 100 mg via INTRAVENOUS
  Filled 2017-08-17: qty 100

## 2017-08-17 MED ORDER — MAGNESIUM 250 MG PO TABS: 1.0000 | ORAL_TABLET | Freq: Every day | ORAL | Status: DC

## 2017-08-17 MED ORDER — VANCOMYCIN HCL IN DEXTROSE 1-5 GM/200ML-% IV SOLN
1000.0000 mg | INTRAVENOUS | Status: AC
  Administered 2017-08-17: 1000 mg via INTRAVENOUS
  Filled 2017-08-17: qty 200

## 2017-08-17 MED ORDER — DEXTROSE 5 % IV SOLN
500.0000 mg | Freq: Three times a day (TID) | INTRAVENOUS | Status: DC
  Administered 2017-08-17 – 2017-08-20 (×9): 500 mg via INTRAVENOUS
  Filled 2017-08-17 (×13): qty 0.5

## 2017-08-17 MED ORDER — ACETAMINOPHEN 500 MG PO TABS
500.0000 mg | ORAL_TABLET | Freq: Four times a day (QID) | ORAL | Status: DC | PRN
  Filled 2017-08-17: qty 1

## 2017-08-17 MED ORDER — DOXYCYCLINE HYCLATE 100 MG IV SOLR
100.0000 mg | Freq: Two times a day (BID) | INTRAVENOUS | Status: DC
  Administered 2017-08-17 – 2017-08-20 (×7): 100 mg via INTRAVENOUS
  Filled 2017-08-17 (×10): qty 100

## 2017-08-17 MED ORDER — THIAMINE HCL 100 MG/ML IJ SOLN
100.0000 mg | Freq: Once | INTRAMUSCULAR | Status: AC
  Administered 2017-08-17: 100 mg via INTRAVENOUS
  Filled 2017-08-17: qty 2

## 2017-08-17 MED ORDER — SODIUM CHLORIDE 0.9 % IV SOLN
INTRAVENOUS | Status: DC
  Administered 2017-08-17 – 2017-08-18 (×3): via INTRAVENOUS

## 2017-08-17 MED ORDER — MAGNESIUM OXIDE 400 (241.3 MG) MG PO TABS
200.0000 mg | ORAL_TABLET | Freq: Every day | ORAL | Status: DC
  Administered 2017-08-17 – 2017-08-23 (×7): 200 mg via ORAL
  Filled 2017-08-17 (×7): qty 1

## 2017-08-17 MED ORDER — OSELTAMIVIR PHOSPHATE 30 MG PO CAPS
30.0000 mg | ORAL_CAPSULE | Freq: Every day | ORAL | Status: DC
  Administered 2017-08-17: 30 mg via ORAL
  Filled 2017-08-17: qty 1

## 2017-08-17 MED ORDER — POLYETHYLENE GLYCOL 3350 17 G PO PACK: 17.0000 g | PACK | Freq: Every day | ORAL | Status: DC | PRN

## 2017-08-17 MED ORDER — SERTRALINE HCL 25 MG PO TABS
25.0000 mg | ORAL_TABLET | Freq: Every day | ORAL | Status: DC
  Administered 2017-08-17 – 2017-08-22 (×6): 25 mg via ORAL
  Filled 2017-08-17 (×6): qty 1

## 2017-08-17 NOTE — ED Notes (Signed)
ED TO INPATIENT HANDOFF REPORT  Name/Age/Gender Vanetta Mulders 83 y.o. female  Code Status    Code Status Orders  (From admission, onward)        Start     Ordered   08/17/17 0936  Do not attempt resuscitation/DNR  Continuous    Question Answer Comment  In the event of cardiac or respiratory ARREST Do not call a "code blue"   In the event of cardiac or respiratory ARREST Do not perform Intubation, CPR, defibrillation or ACLS   In the event of cardiac or respiratory ARREST Use medication by any route, position, wound care, and other measures to relive pain and suffering. May use oxygen, suction and manual treatment of airway obstruction as needed for comfort.      08/17/17 0935    Code Status History    Date Active Date Inactive Code Status Order ID Comments User Context   12/21/2012 23:19 12/22/2012 13:28 Full Code [id number]  Cletus Gash, MD Inpatient    Advance Directive Documentation     Most Recent Value  Type of Advance Directive  Healthcare Power of Attorney, Living will  Pre-existing out of facility DNR order (yellow form or pink MOST form)  No data  "MOST" Form in Place?  No data      Home/SNF/Other Home  Chief Complaint Confusion; Head Pain  Level of Care/Admitting Diagnosis ED Disposition    ED Disposition Condition [facility]: [place] [100102]  Level of Care: Stepdown [14]  Admit to SDU based on following criteria: Hemodynamic compromise or significant risk of instability:  Patient requiring short term acute titration and management of vasoactive drips, and invasive monitoring (i.e., CVP and Arterial line).  Diagnosis: Sepsis [facility]) [[id number]]  Admitting Physician: Caren Griffins [phone]  Attending Physician: Caren Griffins [phone]  Estimated length of stay: past midnight tomorrow  Certification:: I certify this patient will need inpatient services for at least 2 midnights  PT Class (Do Not Modify):  Inpatient [101]  PT Acc Code (Do Not Modify): Private [1]       Medical History Past Medical History:  Diagnosis Date  . Chronic combined systolic and diastolic CHF (congestive heart failure) ([place]) 08/17/2017  . COPD (chronic obstructive pulmonary disease) ([place])   . Hypertension   . Restless leg syndrome   . Tobacco use disorder   . WPW (Wolff-Parkinson-White syndrome)     Allergies Allergies  Allergen Reactions  . Amoxicillin Anaphylaxis and Rash  . Ciprofloxacin Anaphylaxis and Other (See Comments)    Thrush  . Lorabid [Loracarbef] Anaphylaxis and Rash  . Avelox [Moxifloxacin] Other (See Comments)    Tendon and joint pain  . Erythromycin Other (See Comments)    angioedema  . Inderal [Propranolol] Other (See Comments)    Vasculitis  . Chantix [Varenicline] Nausea Only    IV Location/Drains/Wounds Patient Lines/Drains/Airways Status   Active Line/Drains/Airways    Name:   Placement date:   Placement time:   Site:   Days:   Peripheral IV 03/04/15 Right Forearm   03/04/15    1020    Forearm   897   Peripheral IV 08/17/17 Left Antecubital   08/17/17    0923    Antecubital   less than 1   Peripheral IV 08/17/17 Right Antecubital   08/17/17    0923    Antecubital   less than 1   Peripheral IV 08/17/17 Right Wrist   08/17/17  1020    Wrist   less than 1          Labs/Imaging Results for orders placed or performed during the hospital encounter of 08/17/17 (from the past 48 hour(s))  Comprehensive metabolic panel     Status: Abnormal   Collection Time: 08/17/17  9:15 AM  Result Value Ref Range   Sodium 140 135 - 145 mmol/L   Potassium 3.7 3.5 - 5.1 mmol/L   Chloride 105 101 - 111 mmol/L   CO2 26 22 - 32 mmol/L   Glucose, Bld 116 (H) 65 - 99 mg/dL   BUN 25 (H) 6 - 20 mg/dL   Creatinine, Ser 1.03 (H) 0.44 - 1.00 mg/dL   Calcium 8.8 (L) 8.9 - 10.3 mg/dL   Total Protein 7.0 6.5 - 8.1 g/dL   Albumin 3.5 3.5 - 5.0 g/dL   AST 32 15 - 41 U/L   ALT 19 14 - 54 U/L    Alkaline Phosphatase 58 38 - 126 U/L   Total Bilirubin 0.9 0.3 - 1.2 mg/dL   GFR calc non Af Amer 48 (L) >60 mL/min   GFR calc Af Amer 55 (L) >60 mL/min    Comment: (NOTE) The eGFR has been calculated using the CKD EPI equation. This calculation has not been validated in all clinical situations. eGFR's persistently <60 mL/min signify possible Chronic Kidney Disease.    Anion gap 9 5 - 15  CBC     Status: Abnormal   Collection Time: 08/17/17  9:15 AM  Result Value Ref Range   WBC 11.3 (H) 4.0 - 10.5 K/uL   RBC 3.88 3.87 - 5.11 MIL/uL   Hemoglobin 11.9 (L) 12.0 - 15.0 g/dL   HCT 36.7 36.0 - 46.0 %   MCV 94.6 78.0 - 100.0 fL   MCH 30.7 26.0 - 34.0 pg   MCHC 32.4 30.0 - 36.0 g/dL   RDW 13.3 11.5 - 15.5 %   Platelets 230 150 - 400 K/uL  Protime-INR     Status: None   Collection Time: 08/17/17  9:15 AM  Result Value Ref Range   Prothrombin Time 12.6 11.4 - 15.2 seconds   INR 0.95   Blood Culture (routine x 2)     Status: None (Preliminary result)   Collection Time: 08/17/17  9:15 AM  Result Value Ref Range   Specimen Description BLOOD RIGHT ANTECUBITAL    Special Requests      BOTTLES DRAWN AEROBIC AND ANAEROBIC Blood Culture adequate volume Performed at [facility], [address], [place], [place] [address]    Culture PENDING    Report Status PENDING   Blood Culture (routine x 2)     Status: None (Preliminary result)   Collection Time: 08/17/17  9:15 AM  Result Value Ref Range   Specimen Description BLOOD LEFT ANTECUBITAL    Special Requests      BOTTLES DRAWN AEROBIC AND ANAEROBIC Blood Culture adequate volume Performed at [facility], [place] [address], [place], [place] [address]    Culture PENDING    Report Status PENDING   I-Stat CG4 Lactic Acid, ED     Status: Abnormal   Collection Time: 08/17/17  9:25 AM  Result Value Ref Range   Lactic Acid, Venous 2.19 (HH) 0.5 - 1.9 mmol/L   Comment NOTIFIED PHYSICIAN   CBG monitoring, ED     Status: None    Collection Time: 08/17/17  9:39 AM  Result Value Ref Range  Glucose-Capillary 95 65 - 99 mg/dL   Dg Chest 2 View  Result Date: 08/17/2017 CLINICAL DATA:  Sepsis, cough, COPD EXAM: CHEST  2 VIEW COMPARISON:  04/17/2014 FINDINGS: The lungs are hyperinflated likely secondary to COPD. There is right lower lobe hazy airspace disease concerning for pneumonia. There is no pleural effusion or pneumothorax. There is mild stable cardiomegaly. There is thoracic aortic atherosclerosis. The osseous structures are unremarkable. There is a spinal stimulator with the metallic portion projecting over the midthoracic spine. IMPRESSION: 1. Right lower lobe hazy airspace disease concerning for pneumonia. 2. COPD. Electronically Signed   By: Kathreen Devoid   On: 08/17/2017 09:37   Ct Head Wo Contrast  Result Date: 08/17/2017 CLINICAL DATA:  Altered level of consciousness. EXAM: CT HEAD WITHOUT CONTRAST TECHNIQUE: Contiguous axial images were obtained from the base of the skull through the vertex without intravenous contrast. COMPARISON:  CT scan of March 04, 2015. FINDINGS: Brain: Mild diffuse cortical atrophy is noted. Mild chronic ischemic white matter disease is noted. No mass effect or midline shift is noted. Ventricular size is within normal limits. There is no evidence of mass lesion, hemorrhage or acute infarction. Vascular: No hyperdense vessel or unexpected calcification. Skull: Normal. Negative for fracture or focal lesion. Sinuses/Orbits: Bilateral maxillary, sphenoid and ethmoid sinusitis is noted. Other: None. IMPRESSION: Mild diffuse cortical atrophy. Mild chronic ischemic white matter disease. Bilateral ethmoid, sphenoid and maxillary sinusitis. No acute intracranial abnormality seen. Electronically Signed   By: Marijo Conception, M.D.   On: 08/17/2017 10:55    Pending Labs Unresulted Labs (From admission, onward)   Start     Ordered   08/17/17 9480  Influenza panel by PCR (type A & B)  (Influenza PCR Panel)   Once,   R     08/17/17 0950   08/17/17 0853  Urinalysis, Routine w reflex microscopic  STAT,   STAT     08/17/17 0852   Signed and Held  Comprehensive metabolic panel  Tomorrow morning,   R     Signed and Held   Signed and Held  CBC  Tomorrow morning,   R     Signed and Held      Vitals/Pain Today's Vitals   08/17/17 1003 08/17/17 1007 08/17/17 1030 08/17/17 1100  BP: 119/80  109/71 (!) 91/57  Pulse: 61 (!) 103 92 93  Resp: 20 16 15 19   Temp:      TempSrc:      SpO2: (!) 89% 97% 97% 97%  Weight:      Height:        Isolation Precautions Droplet precaution  Medications Medications  aztreonam (AZACTAM) 1 g in dextrose 5 % 50 mL IVPB (not administered)  doxycycline (VIBRAMYCIN) 100 mg in dextrose 5 % 250 mL IVPB (not administered)  doxycycline (VIBRAMYCIN) 100 mg in dextrose 5 % 250 mL IVPB (not administered)  guaiFENesin-dextromethorphan (ROBITUSSIN DM) 100-10 MG/5ML syrup 5 mL (not administered)  acetaminophen (TYLENOL) tablet 650 mg (650 mg Oral Given 08/17/17 0947)  vancomycin (VANCOCIN) IVPB 1000 mg/200 mL premix (0 mg Intravenous Stopped 08/17/17 1055)  metroNIDAZOLE (FLAGYL) IVPB 500 mg (0 mg Intravenous Stopped 08/17/17 1038)  sodium chloride 0.9 % bolus 1,000 mL (1,000 mLs Intravenous New Bag/Given 08/17/17 0936)  thiamine (B-1) injection 100 mg (100 mg Intravenous Given 08/17/17 0947)    Mobility walks

## 2017-08-17 NOTE — ED Notes (Signed)
CHARGE STACEY RN AND ASSIST DIRT WENDY AT BEDSIDE STARTING CARE OF PT. SEPSIS PROTOCOL STARTED
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
## 2017-08-17 NOTE — ED Triage Notes (Signed)
Per family pt confusion and paranoia for months but severe confusion worsening over past week. Family noted bruising to back of head over past week; takes baby aspirin a day.

## 2017-08-17 NOTE — Progress Notes (Signed)
PHARMACY NOTE:  ANTIMICROBIAL RENAL DOSAGE ADJUSTMENT  Current antimicrobial regimen includes a mismatch between antimicrobial dosage and estimated renal function.  As per policy approved by the Pharmacy & Therapeutics and Medical Executive Committees, the antimicrobial dosage will be adjusted accordingly.  Current antimicrobial dosage:  Tamiflu 75 mg PO BID x 5 days   Indication: influenza  Renal Function:  Estimated Creatinine Clearance: 27.8 mL/min (A) (by C-G formula based on SCr of 1.03 mg/dL (H)). []      On intermittent HD, scheduled: []      On CRRT    Antimicrobial dosage has been changed to:  Tamiflu 30 mg PO daily x 5 days   Additional comments:   Thank you for allowing pharmacy to be a part of this patient's care.  Adalberto ColeNikola Xiana Carns, PharmD, BCPS Pager [phone] 08/17/2017 12:06 PM

## 2017-08-17 NOTE — Progress Notes (Signed)
Pharmacy Antibiotic Note  Deborah MoronBetty B [name] is a 83 y.o. female admitted on 08/17/2017 with sepsis d/t RLL pneumonia.  Pharmacy has been consulted for vancomycin and aztreonam dosing.  Plan: Vancomycin 1000 mg IV given at 10a today, then 750 mg IV q48 hr (est AUC 466 based on SCr 1.03)  Measure vancomycin AUC at steady state as indicated  Aztreonam 500 mg IV q8 hr  Doxy and Tamiflu per MD, dosing appropriate  Consider checking MRSA PCR to rule out MRSA pneumonia; patient doesn't appear to have obvious risk factors for MRSA and no recent PCR.  Height: 5\' 1"  (154.9 cm) Weight: 99 lb (44.9 kg) IBW/kg (Calculated) : 47.8  Temp (24hrs), Avg:100.8 F (38.2 C), Min:99.1 F (37.3 C), Max:102.5 F (39.2 C)  Recent Labs  Lab 08/17/17 0915 08/17/17 0925 08/17/17 1122  WBC 11.3*  --   --   CREATININE 1.03*  --   --   LATICACIDVEN  --  2.19* 1.36    Estimated Creatinine Clearance: 27.8 mL/min (A) (by C-G formula based on SCr of 1.03 mg/dL (H)).    Allergies  Allergen Reactions  . Amoxicillin Anaphylaxis and Rash  . Ciprofloxacin Anaphylaxis and Other (See Comments)    Thrush  . Lorabid [Loracarbef] Anaphylaxis and Rash  . Avelox [Moxifloxacin] Other (See Comments)    Tendon and joint pain  . Erythromycin Other (See Comments)    angioedema  . Inderal [Propranolol] Other (See Comments)    Vasculitis  . Chantix [Varenicline] Nausea Only    Antimicrobials this admission: Vancomycin 12/20 >>  Aztreonam 12/20 >>  Doxycycline 12/20 >> Tamiflu 12/20 >>  Dose adjustments this admission: ---  Microbiology results: 12/20 BCx: sent 12/20 Strep/Legionella UAg 12/20 Flu panel:    Thank you for allowing pharmacy to be a part of this patient's care.  [name], PharmD, BCPS Pager: [phone] 08/17/2017, 12:13 PM

## 2017-08-17 NOTE — H&P (Signed)
History and Physical    [name]:[id number] DOB: 09/21/1928 DOA: 08/17/2017  I have briefly reviewed the patient's prior medical records in [facility]  PCP: [name], MD  Patient coming from: home  Chief Complaint: confusion, AMS  HPI: [name] is a 83 y.o. female with medical history significant of probable dementia, chronic combined systolic and diastolic CHF, COPD, hypertension, prior tobacco abuse, is being brought to the hospital by the daughter for increased confusion and hallucinations at home.  The patient is alert but very confused in the ED and cannot contribute to the story. The daughter tells me that over the past week she has been more confused than normal, and over the last 3-4 days she has been having increased paranoid ideation, hallucinations, and increased confusion.  She is also noticed more sleepiness and it seems like all the patient wants to do at home his sleep.  She states that last week during the snowstorm, patient went out and spent an unknown amount of time and was not dressed appropriately.  Patient has no recollection of those events.  Daughter tells me that patient has a long history of tobacco abuse and COPD and a chronic cough, however feels like in the last few days her cough is changed in quality and she seems to be trying to cough something up.  She did not notice fever or chills.  Other than that, patient has not been complained to the family about any pain or discomfort of any sort.  ED Course: In the ED on presentation, patient was found to be septic with a fever of 102.5, heart rate in the 120s, she was normotensive initially however on my evaluation blood pressure was reading as low as 91/57, and she is satting well on 2 L nasal cannula.  Blood work shows a lactic acid of 2.1, and a white count of 11.3.  Her creatinine is 1.0.  She underwent a chest x-ray which raised concern for right lower lobe pneumonia.  Underwent a CT scan of the  brain without acute findings.  She has a lot of antibiotic allergies and was given vancomycin, doxycycline as well as aztreonam and we were called for admission for sepsis due to pneumonia.  Review of Systems: As per HPI otherwise 10 point review of systems negative.   Past Medical History:  Diagnosis Date  . Chronic combined systolic and diastolic CHF (congestive heart failure) (HCC) 08/17/2017  . COPD (chronic obstructive pulmonary disease) (HCC)   . Hypertension   . Restless leg syndrome   . Tobacco use disorder   . WPW (Wolff-Parkinson-White syndrome)     Past Surgical History:  Procedure Laterality Date  . APPENDECTOMY    . BACK SURGERY     MULTIPLE  . HEMANGIOMA EXCISION    . KIDNEY SURGERY     RT KIDNEY TACK  . NECK SURGERY    . TONSILLECTOMY       reports that she has been smoking.  She does not have any smokeless tobacco history on file. She reports that she does not drink alcohol. Her drug history is not on file.  Allergies  Allergen Reactions  . Amoxicillin Anaphylaxis and Rash  . Ciprofloxacin Anaphylaxis and Other (See Comments)    Thrush  . Lorabid [Loracarbef] Anaphylaxis and Rash  . Avelox [Moxifloxacin] Other (See Comments)    Tendon and joint pain  . Erythromycin Other (See Comments)    angioedema  . Inderal [Propranolol] Other (See Comments)  Vasculitis  . Chantix [Varenicline] Nausea Only    Family history reviewed and noncontributory  Prior to Admission medications   Medication Sig Start Date End Date Taking? Authorizing Provider  acetaminophen (TYLENOL) 500 MG tablet Take 500 mg by mouth every 6 (six) hours as needed for moderate pain.   Yes [provider]  Aclidinium Bromide (TUDORZA PRESSAIR) 400 MCG/ACT AEPB Inhale 1 puff into the lungs every other day.   Yes [provider]  albuterol (PROVENTIL) (2.5 MG/3ML) 0.083% nebulizer solution Take 2.5 mg by nebulization 3 (three) times daily as needed for wheezing or shortness of  breath.    Yes [provider]  aspirin EC 81 MG EC tablet Take 1 tablet (81 mg total) by mouth daily. 12/22/12  Yes Corky CraftsVaranasi, Jayadeep S, MD  B Complex Vitamins (B COMPLEX-B12) TABS Take 1 tablet by mouth daily.   Yes [provider]  Cholecalciferol (VITAMIN D3) 1000 units CAPS Take 1 capsule by mouth daily.   Yes [provider]  fentaNYL (DURAGESIC - DOSED MCG/HR) 25 MCG/HR Place 1 patch onto the skin every 3 (three) days.    Yes [provider]  fluticasone furoate-vilanterol (BREO ELLIPTA) 200-25 MCG/INH AEPB Inhale 1 puff into the lungs at bedtime.   Yes [provider]  LORazepam (ATIVAN) 1 MG tablet Take 0.5 mg by mouth 2 (two) times daily.    Yes [provider]  losartan (COZAAR) 50 MG tablet Take 50 mg by mouth daily. 08/09/17  Yes [provider]  Magnesium 250 MG TABS Take 1 tablet by mouth daily.   Yes [provider]  methocarbamol (ROBAXIN) 500 MG tablet Take 250 mg by mouth at bedtime as needed for spasms. 06/12/17  Yes [provider]  nitroGLYCERIN (NITROSTAT) 0.4 MG SL tablet Place 1 tablet (0.4 mg total) under the tongue every 5 (five) minutes x 3 doses as needed for chest pain. 12/22/12  Yes Corky CraftsVaranasi, Jayadeep S, MD  pantoprazole (PROTONIX) 40 MG tablet Take 40 mg by mouth 2 (two) times daily.   Yes [provider]  polyethylene glycol (MIRALAX / GLYCOLAX) packet Take 17 g by mouth daily as needed.    Yes [provider]  pramipexole (MIRAPEX) 1 MG tablet Take 0.5-1 mg by mouth 2 (two) times daily. 0.5 mg during the day prn and 1 mg at bedtime   Yes [provider]  Probiotic Product (PROBIOTIC DAILY) CAPS Take 1 capsule by mouth 3 (three) times a week.    Yes [provider]  QUEtiapine (SEROQUEL) 100 MG tablet Take 75-100 mg by mouth at bedtime. Only gives 100 mg four times a week 08/09/17  Yes [provider]  sertraline (ZOLOFT) 25 MG tablet Take 25 mg  by mouth at bedtime. 08/09/17  Yes [provider]    Physical Exam: Vitals:   08/17/17 1003 08/17/17 1007 08/17/17 1030 08/17/17 1100  BP: 119/80  109/71 (!) 91/57  Pulse: 61 (!) 103 92 93  Resp: 20 16 15 19   Temp:      TempSrc:      SpO2: (!) 89% 97% 97% 97%  Weight:      Height:        Constitutional: NAD, calm, comfortable, confused Eyes: PERRL, lids and conjunctivae normal ENMT: Mucous membranes are dry.  Mild erythematous oropharynx, clear of any exudate or lesions. Neck: normal, supple Respiratory: Coarse breath sounds bilaterally with diffuse rhonchi, no wheezing, no crackles. Normal respiratory effort. No accessory muscle use.  Cardiovascular: Regular rate and rhythm, no murmurs / rubs / gallops. No extremity edema. 2+ pedal pulses.  Abdomen: no tenderness, no masses palpated. Bowel sounds positive.  Musculoskeletal:  Decreased muscle tone.  Skin: no rashes Neurologic: CN 2-12 grossly intact. Strength 5/5 in all 4.  Psychiatric: Alert to person only  Labs on Admission: I have personally reviewed following labs and imaging studies  CBC: Recent Labs  Lab 08/17/17 0915  WBC 11.3*  HGB 11.9*  HCT 36.7  MCV 94.6  PLT 230   Basic Metabolic Panel: Recent Labs  Lab 08/17/17 0915  NA 140  K 3.7  CL 105  CO2 26  GLUCOSE 116*  BUN 25*  CREATININE 1.03*  CALCIUM 8.8*   GFR: Estimated Creatinine Clearance: 27.8 mL/min (A) (by C-G formula based on SCr of 1.03 mg/dL (H)). Liver Function Tests: Recent Labs  Lab 08/17/17 0915  AST 32  ALT 19  ALKPHOS 58  BILITOT 0.9  PROT 7.0  ALBUMIN 3.5   No results for input(s): LIPASE, AMYLASE in the last 168 hours. No results for input(s): AMMONIA in the last 168 hours. Coagulation Profile: Recent Labs  Lab 08/17/17 0915  INR 0.95   Cardiac Enzymes: No results for input(s): CKTOTAL, CKMB, CKMBINDEX, TROPONINI in the last 168 hours. BNP (last 3 results) No results for input(s): PROBNP in the last 8760  hours. HbA1C: No results for input(s): HGBA1C in the last 72 hours. CBG: Recent Labs  Lab 08/17/17 0939  GLUCAP 95   Lipid Profile: No results for input(s): CHOL, HDL, LDLCALC, TRIG, CHOLHDL, LDLDIRECT in the last 72 hours. Thyroid Function Tests: No results for input(s): TSH, T4TOTAL, FREET4, T3FREE, THYROIDAB in the last 72 hours. Anemia Panel: No results for input(s): VITAMINB12, FOLATE, FERRITIN, TIBC, IRON, RETICCTPCT in the last 72 hours. Urine analysis:    Component Value Date/Time   COLORURINE AMBER (A) 04/25/2014 1827   APPEARANCEUR CLEAR 04/25/2014 1827   LABSPEC 1.023 04/25/2014 1827   PHURINE 5.0 04/25/2014 1827   GLUCOSEU NEGATIVE 04/25/2014 1827   HGBUR TRACE (A) 04/25/2014 1827   BILIRUBINUR SMALL (A) 04/25/2014 1827   KETONESUR 15 (A) 04/25/2014 1827   PROTEINUR 30 (A) 04/25/2014 1827   UROBILINOGEN 0.2 04/25/2014 1827   NITRITE NEGATIVE 04/25/2014 1827   LEUKOCYTESUR NEGATIVE 04/25/2014 1827     Radiological Exams on Admission: Dg Chest 2 View  Result Date: 08/17/2017 CLINICAL DATA:  Sepsis, cough, COPD EXAM: CHEST  2 VIEW COMPARISON:  04/17/2014 FINDINGS: The lungs are hyperinflated likely secondary to COPD. There is right lower lobe hazy airspace disease concerning for pneumonia. There is no pleural effusion or pneumothorax. There is mild stable cardiomegaly. There is thoracic aortic atherosclerosis. The osseous structures are unremarkable. There is a spinal stimulator with the metallic portion projecting over the midthoracic spine. IMPRESSION: 1. Right lower lobe hazy airspace disease concerning for pneumonia. 2. COPD. Electronically Signed   By: Elige KoHetal  Patel   On: 08/17/2017 09:37   Ct Head Wo Contrast  Result Date: 08/17/2017 CLINICAL DATA:  Altered level of consciousness. EXAM: CT HEAD WITHOUT CONTRAST TECHNIQUE: Contiguous axial images were obtained from the base of the skull through the vertex without intravenous contrast. COMPARISON:  CT scan of  March 04, 2015. FINDINGS: Brain: Mild diffuse cortical atrophy is noted. Mild chronic ischemic white matter disease is noted. No mass effect or midline shift is noted. Ventricular size is within normal limits. There is no evidence of mass lesion, hemorrhage or acute infarction. Vascular: No hyperdense  vessel or unexpected calcification. Skull: Normal. Negative for fracture or focal lesion. Sinuses/Orbits: Bilateral maxillary, sphenoid and ethmoid sinusitis is noted. Other: None. IMPRESSION: Mild diffuse cortical atrophy. Mild chronic ischemic white matter disease. Bilateral ethmoid, sphenoid and maxillary sinusitis. No acute intracranial abnormality seen. Electronically Signed   By: Lupita Raider, M.D.   On: 08/17/2017 10:55    EKG: Independently reviewed.  Sinus tachycardia  Assessment/Plan Active Problems:   Tobacco use disorder   WPW (Wolff-Parkinson-White syndrome)   Sepsis (HCC)   Chronic combined systolic and diastolic CHF (congestive heart failure) (HCC)   Right lower lobe pneumonia (HCC)    Sepsis due to right lower lobe pneumonia -Patient presented to the emergency room with sepsis, meeting criteria with high fever, tachycardia, and a source of the chest x-ray -She has multiple medication allergies, will continue vancomycin, doxycycline and aztreonam for now -Rule out flu as daughter reports one other family member with URI type symptoms, start Tamiflu empirically and discontinue if flu is negative  Chronic combined systolic and diastolic CHF -She was to see Dr. [name] with cardiology in the past, most recent cardiology evaluation that I see was back in 2014, and it was mentioned that her echo at that time had an EF of about 45-50%. -She appears euvolemic currently, chest x-ray without fluid overload, however she has been treated for sepsis and will closely monitor fluid status  History of for [facility]  -status post ablation in the past by Dr. [name]  COPD -No wheezing, she has very  coarse breath sounds in the setting of pneumonia, continue home nebulizers, hold steroids for now  Tobacco abuse, in remission -Quit 2 years ago  Dementia -Most likely has underlying dementia as per her daughter over the last year she has been having intermittent episodes of confusion, "she has good days and she has bad days"   DVT prophylaxis: Lovenox  Code Status: DNR  Family Communication: d/w daughter bedside Disposition Plan: admit to SDU, home when ready, PT consulted  Consults called: none      Admission status: inpatient    At the time of admission, it appears that the appropriate admission status for this patient is INPATIENT. This is judged to be reasonable and necessary in order to provide the required high service intensity to ensure the patient's safety given the presenting symptoms, physical exam findings, and initial radiographic and laboratory data in the context of their chronic comorbidities. Current circumstances are sepsis in the setting of lobar pneumonia, and it is felt to place patient at high risk for further clinical deterioration threatening life, limb, or organ. Moreover, it is my clinical judgment that the patient will require inpatient hospital care spanning beyond 2 midnights from the point of admission and that early discharge would result in unnecessary risk of decompensation and readmission or threat to life, limb or bodily function.   Pamella Pert, MD [facility] Pager [phone]  If 7PM-7AM, please contact night-coverage [email or web address] Password [email or web address]  08/17/2017, 11:16 AM

## 2017-08-17 NOTE — ED Notes (Signed)
ED Provider at bedside. 

## 2017-08-17 NOTE — ED Notes (Signed)
AWARE OF BOLUS INFUSION, ANTIBIOTICS INFUSING, NEED FOR URINE, FLU COLLECTION

## 2017-08-17 NOTE — ED Notes (Signed)
ADMISSION Provider at bedside. VERBAL ORDER FOR 2ND LITER BOLUS GIVEN AS ORDER

## 2017-08-17 NOTE — Progress Notes (Signed)
Palliative Medicine consult noted. Due to high referral volume, there may be a delay seeing this patient. Please call the Palliative Medicine Team office at [phone] if recommendations are needed in the interim.  Thank you for inviting us to see this patient.  Daire Okimoto G. Brittan Mapel, RN, BSN, CHPN 08/17/2017 4:27 PM Office [phone] 

## 2017-08-18 DIAGNOSIS — A403 Sepsis due to Streptococcus pneumoniae: Secondary | ICD-10-CM

## 2017-08-18 DIAGNOSIS — J13 Pneumonia due to Streptococcus pneumoniae: Secondary | ICD-10-CM

## 2017-08-18 LAB — COMPREHENSIVE METABOLIC PANEL
ALK PHOS: 47 U/L (ref 38–126)
ALT: 19 U/L (ref 14–54)
ANION GAP: 5 (ref 5–15)
AST: 25 U/L (ref 15–41)
Albumin: 2.7 g/dL — ABNORMAL LOW (ref 3.5–5.0)
BILIRUBIN TOTAL: 0.6 mg/dL (ref 0.3–1.2)
BUN: 14 mg/dL (ref 6–20)
CALCIUM: 7.9 mg/dL — AB (ref 8.9–10.3)
CO2: 24 mmol/L (ref 22–32)
Chloride: 111 mmol/L (ref 101–111)
Creatinine, Ser: 0.64 mg/dL (ref 0.44–1.00)
Glucose, Bld: 98 mg/dL (ref 65–99)
POTASSIUM: 3.3 mmol/L — AB (ref 3.5–5.1)
Sodium: 140 mmol/L (ref 135–145)
TOTAL PROTEIN: 5.7 g/dL — AB (ref 6.5–8.1)

## 2017-08-18 LAB — CBC
HEMATOCRIT: 29.7 % — AB (ref 36.0–46.0)
HEMOGLOBIN: 9.6 g/dL — AB (ref 12.0–15.0)
MCH: 30 pg (ref 26.0–34.0)
MCHC: 32.3 g/dL (ref 30.0–36.0)
MCV: 92.8 fL (ref 78.0–100.0)
Platelets: 172 10*3/uL (ref 150–400)
RBC: 3.2 MIL/uL — AB (ref 3.87–5.11)
RDW: 13.3 % (ref 11.5–15.5)
WBC: 6.3 10*3/uL (ref 4.0–10.5)

## 2017-08-18 MED ORDER — HALOPERIDOL LACTATE 5 MG/ML IJ SOLN
2.0000 mg | Freq: Once | INTRAMUSCULAR | Status: AC
  Administered 2017-08-18: 2 mg via INTRAVENOUS
  Filled 2017-08-18: qty 1

## 2017-08-18 NOTE — Evaluation (Signed)
Clinical/Bedside Swallow Evaluation Patient Details  Name: [name] MRN: [id number] Date of Birth: [birth year]  Today's Date: 08/18/2017 Time: SLP Start Time (ACUTE ONLY): 1600 SLP Stop Time (ACUTE ONLY): 1622 SLP Time Calculation (min) (ACUTE ONLY): 22 min  Past Medical History:  Past Medical History:  Diagnosis Date  . Chronic combined systolic and diastolic CHF (congestive heart failure) (HCC) 08/17/2017  . COPD (chronic obstructive pulmonary disease) (HCC)   . Hypertension   . Restless leg syndrome   . Tobacco use disorder   . WPW (Wolff-Parkinson-White syndrome)    Past Surgical History:  Past Surgical History:  Procedure Laterality Date  . APPENDECTOMY    . BACK SURGERY     MULTIPLE  . HEMANGIOMA EXCISION    . KIDNEY SURGERY     RT KIDNEY TACK  . NECK SURGERY    . TONSILLECTOMY     HPI:  83 y.o. female with medical history significant for probable dementia, chronic combined systolic and diastolic CHF, COPD, hypertension, prior tobacco abuse, is being brought to the hospital by the daughter for increased confusion and hallucinations at home. She was septic in the emergency room, and chest x-ray showed right lower lobe pneumonia. Per records, no prior swallow studies.  Hx ACDF C4-7.  Daughter, [name], reports increased coughing with meals recently.    Assessment / Plan / Recommendation Clinical Impression  Pt lethargic and difficult to arouse for assessment, likely secondary to medication last night.  She consumed limited ice chips, tspns of water with poor oral awareness; spontaneous swallow did not elicit overt concerns for aspiration, but intake was minimal.  Pt's daughter, [name], describes recent decline related to MS and eating.  For today, only feed pt if mental status allows.  SLP will follow more more indepth assessment.   SLP Visit Diagnosis: Dysphagia, unspecified (R13.10)    Aspiration Risk     Allow current diet if pt is alert   Diet Recommendation      Medication Administration: Crushed with puree    Other  Recommendations Oral Care Recommendations: Oral care QID   Follow up Recommendations (tba)      Frequency and Duration min 2x/week  1 week       Prognosis Barriers to Reach Goals: Cognitive deficits      Swallow Study   General Date of Onset: 08/17/17 HPI: 83 y.o. female with medical history significant for probable dementia, chronic combined systolic and diastolic CHF, COPD, hypertension, prior tobacco abuse, is being brought to the hospital by the daughter for increased confusion and hallucinations at home. She was septic in the emergency room, and chest x-ray showed right lower lobe pneumonia. Per records, no prior swallow studies.  Hx ACDF C4-7.  Daughter, [name], reports increased coughing with meals recently.  Type of Study: Bedside Swallow Evaluation Previous Swallow Assessment: no Diet Prior to this Study: Dysphagia 3 (soft);Thin liquids Temperature Spikes Noted: No Respiratory Status: Room air History of Recent Intubation: No Behavior/Cognition: Lethargic/Drowsy Oral Cavity Assessment: Within Functional Limits Oral Care Completed by SLP: Recent completion by staff Vision: (maintained eyes closed) Self-Feeding Abilities: Needs assist Patient Positioning: Upright in chair Baseline Vocal Quality: Hoarse Volitional Cough: Cognitively unable to elicit Volitional Swallow: Unable to elicit    Oral/Motor/Sensory Function Overall Oral Motor/Sensory Function: Other (comment)(symmetric at rest)   Ice Chips Ice chips: Within functional limits   Thin Liquid Thin Liquid: Impaired Presentation: Spoon Oral Phase Impairments: Poor awareness of bolus    Nectar Thick Nectar Thick Liquid: Not  tested   Honey Thick Honey Thick Liquid: Not tested   Puree Puree: Not tested   Solid   GO   Solid: Not tested        Blenda Mountsouture, Juliah Scadden Laurice 08/18/2017,4:31 PM

## 2017-08-18 NOTE — Progress Notes (Signed)
PT Cancellation Note  Patient Details Name: Duanne MoronBetty B Allston MRN: [id number] DOB: 1928/11/13   Cancelled Treatment:    Reason Eval/Treat Not Completed: Fatigue/lethargy limiting ability to participatepateit was sound asleep. Will check back this PM.   Rada HayHill, Anouk Critzer Elizabeth 08/18/2017, 1:38 PM  Blanchard KelchKaren Madia Carvell PT [phone]

## 2017-08-18 NOTE — Care Management Note (Signed)
Case Management Note  Patient Details  Name: Duanne MoronBetty B Balaban MRN: [id number] Date of Birth: 09-13-1928  Subjective/Objective:                  Pna, copd  Action/Plan: Date: August 18, 2017 Marcelle SmilingRhonda Taysom Glymph, BSN, [place]3, [place]CCM [phone] Chart and notes review for patient progress and needs. Will follow for case management and discharge needs. Next review date: [id number] Expected Discharge Date:  (unknown)               Expected Discharge Plan:  Home/Self Care  In-House Referral:     Discharge planning Services  CM Consult  Post Acute Care Choice:    Choice offered to:     DME Arranged:    DME Agency:     HH Arranged:    HH Agency:     Status of Service:  In process, will continue to follow  If discussed at Long Length of Stay Meetings, dates discussed:    Additional Comments:  Golda AcreDavis, Jermain Curt Lynn, RN 08/18/2017, 8:26 AM

## 2017-08-18 NOTE — Evaluation (Signed)
Physical Therapy Evaluation Patient Details Name: [name] MRN: [id number] DOB: 1928-10-26 Today's Date: 08/18/2017   History of Present Illness  83 y.o. female with medical history significant of probable dementia, chronic combined systolic and diastolic CHF, COPD, hypertension, prior tobacco abuse, is being brought to the hospital by the daughter for increased confusion and hallucinations at home.  She was septic in the emergency room 08/17/17, and chest x-ray showed right lower lobe pneumonia  Clinical Impression  The patient required extensive stimulation to arouse [facility] then to recliner. The patient barely opens eyes. Daughter reports that yesterday patient was alert and conversive appropriately.  Per RN, may be effects of medication given overnight for restlessness. Pt admitted with above diagnosis. Pt currently with functional limitations due to the deficits listed below (see PT Problem List). Pt will benefit from skilled PT to increase their independence and safety with mobility to allow discharge to the venue listed below.       Follow Up Recommendations SNF    Equipment Recommendations  None recommended by PT    Recommendations for Other Services       Precautions / Restrictions Precautions Precautions: Fall Precaution Comments: can be agitated      Mobility  Bed Mobility Overal bed mobility: Needs Assistance Bed Mobility: Supine to Sit     Supine to sit: Mod assist     General bed mobility comments: due to being so lethargic, requires mod assist. per daughter, when awake, able to move self around.  Transfers Overall transfer level: Needs assistance   Transfers: Sit to/from Stand;Stand Pivot Transfers Sit to Stand: Mod assist Stand pivot transfers: Mod assist       General transfer comment: steady assist and hand over [facility]  Ambulation/Gait Ambulation/Gait assistance: Mod assist;+2 physical assistance;+2  safety/equipment Ambulation Distance (Feet): 5 Feet Assistive device: 2 person hand held assist Gait Pattern/deviations: Staggering left;Staggering right;Decreased step length - right;Decreased step length - left     General Gait Details: patient barely aroused, very unsteady to ambulate a few feet.   Stairs            Wheelchair Mobility    Modified Rankin (Stroke Patients Only)       Balance Overall balance assessment: History of Falls;Needs assistance Sitting-balance support: Feet supported;Bilateral upper extremity supported Sitting balance-Leahy Scale: Fair     Standing balance support: During functional activity;Bilateral upper extremity supported Standing balance-Leahy Scale: Poor                               Pertinent Vitals/Pain Pain Assessment: No/denies pain    Home Living Family/patient expects to be discharged to:: Private residence Living Arrangements: Children Available Help at Discharge: Family Type of Home: House           Additional Comments: patient lived with son, not alone recently    Prior Function Level of Independence: Needs assistance   Gait / Transfers Assistance Needed: independent, wanders from house           Hand Dominance        Extremity/Trunk Assessment   Upper Extremity Assessment Upper Extremity Assessment: Generalized weakness    Lower Extremity Assessment Lower Extremity Assessment: Generalized weakness    Cervical / Trunk Assessment Cervical / Trunk Assessment: Normal  Communication      Cognition Arousal/Alertness: Lethargic;Suspect due to medications Behavior During Therapy: Restless Overall Cognitive Status: Impaired/Different  from baseline Area of Impairment: Orientation;Attention;Safety/judgement;Following commands;Awareness                   Current Attention Level: Divided   Following Commands: Follows one step commands inconsistently       General Comments: patient  barely aroused to speak  but [facility] and  recline with multimodal cues.      General Comments      Exercises     Assessment/Plan    PT Assessment Patient needs continued PT services  PT Problem List Decreased balance;Decreased activity tolerance;Decreased mobility;Decreased cognition;Decreased safety awareness       PT Treatment Interventions Gait training;DME instruction;Functional mobility training;Therapeutic activities;Patient/family education    PT Goals (Current goals can be found in the Care Plan section)  Acute Rehab PT Goals Patient Stated Goal: per daughter, to go to a facility where she will be safe. PT Goal Formulation: With family Time For Goal Achievement: 09/01/17 Potential to Achieve Goals: Fair    Frequency Min 2X/week   Barriers to discharge Decreased caregiver support      Co-evaluation               AM-PAC PT "6 Clicks" Daily Activity  Outcome Measure Difficulty turning over in bed (including adjusting bedclothes, sheets and blankets)?: Unable Difficulty moving from lying on back to sitting on the side of the bed? : Unable Difficulty sitting down on and standing up from a chair with arms (e.g., wheelchair, bedside commode, etc,.)?: Unable Help needed moving to and from a bed to chair (including a wheelchair)?: Total Help needed walking in hospital room?: Total Help needed climbing 3-5 steps with a railing? : Total 6 Click Score: 6    End of Session   Activity Tolerance: Patient limited by lethargy Patient left: in chair;with call bell/phone within reach;with chair alarm set;with family/visitor present Nurse Communication: Mobility status PT Visit Diagnosis: Difficulty in walking, not elsewhere classified (R26.2)    Time: [id number] PT Time Calculation (min) (ACUTE ONLY): 20 min   Charges:   PT Evaluation $PT Eval Low Complexity: 1 Low     PT G CodesBlanchard Kelch:       Quinto Tippy PT [phone]   Rada HayHill, Takeria Marquina Elizabeth 08/18/2017,  4:01 PM

## 2017-08-18 NOTE — Progress Notes (Addendum)
PROGRESS NOTE  [name]:[id number] DOB: [birth year] DOA: 08/17/2017 PCP: [name], MD   LOS: 1 day   Brief Narrative / Interim history: 83 y.o. female with medical history significant of probable dementia, chronic combined systolic and diastolic CHF, COPD, hypertension, prior tobacco abuse, is being brought to the hospital by the daughter for increased confusion and hallucinations at home.  She was septic in the emergency room, and chest x-ray showed right lower lobe pneumonia.  She was started on broad-spectrum antibiotics and admitted to stepdown.  Strep pneumo antigen returned positive.   Assessment & Plan: Active Problems:   Tobacco use disorder   WPW (Wolff-Parkinson-White syndrome)   Sepsis (HCC)   Chronic combined systolic and diastolic CHF (congestive heart failure) (HCC)   Right lower lobe pneumonia (HCC)   Sepsis due to right lower lobe pneumonia, pneumococcal pneumonia -Patient presented to the emergency room with sepsis, meeting criteria with high fever, tachycardia, and a source of the chest x-ray -She has multiple medication allergies, will continue vancomycin, doxycycline and aztreonam for now, discontinue doxycycline and aztreonam tomorrow if cultures remain negative at 48 hours -She was influenza negative, strep pneumo positive -Sepsis physiology has improved  Chronic combined systolic and diastolic CHF -She was to see Dr. [name] with cardiology in the past, most recent cardiology evaluation that I see was back in 2014, and it was mentioned that her echo at that time had an EF of about 45-50%. -She remains euvolemic this morning, no lower extremity edema, stable respiratory status, continue IV fluids  History of for [facility]  -status post ablation in the past by Dr. [name]  COPD -No wheezing, she has coarse breath sounds in the setting of pneumonia, continue home nebulizers, continue to hold steroids for now  Tobacco abuse, in remission -Quit 2 years  ago  Dementia with behavioral disturbances -Most likely has underlying dementia as per her daughter over the last year she has been having intermittent episodes of confusion, "she has good days and she has bad days" -More agitated and combative overnight, required Haldol    DVT prophylaxis: Lovenox Code Status: DNR Family Communication: No family at bedside this morning Disposition Plan: Transfer to floor mid morning if remains stable  Consultants:   None   Procedures:   None   Antimicrobials:  Vancomycin 12/20 >>  Doxycycline 12/20 >>  Aztreonam 12/20 >>   Subjective: -Groggy this morning, she received Haldol earlier on she seems to be sleeping now.  Objective: Vitals:   08/18/17 0005 08/18/17 0200 08/18/17 0400 08/18/17 0431  BP:  (!) 155/67 (!) 152/58   Pulse:  60 (!) 56   Resp:  15 12   Temp: (!) 97.5 F (36.4 C)   (!) 96.4 F (35.8 C)  TempSrc: Oral   Axillary  SpO2:  98% 95%   Weight:      Height:        Intake/Output Summary (Last 24 hours) at 08/18/2017 0610 Last data filed at 08/18/2017 0500 Gross per 24 hour  Intake 3278.33 ml  Output 900 ml  Net 2378.33 ml   Filed Weights   08/17/17 0847  Weight: 44.9 kg (99 lb)    Examination:  Constitutional: No apparent distress, post Haldol sleeping ENMT: Mucous membranes are moist.  Respiratory: Coarse breath sounds bilaterally, improved compared to ED last night, no wheezing, no crackles. Normal respiratory effort. No accessory muscle use.  Cardiovascular: Regular rate and rhythm, no murmurs / rubs / gallops. No LE edema. 2+  pedal pulses.  Abdomen: no tenderness. Bowel sounds positive.  Musculoskeletal: decreased muscle tone.  Neurologic: non focal    Data Reviewed: I have independently reviewed following labs and imaging studies   CBC: Recent Labs  Lab 08/17/17 0915  WBC 11.3*  HGB 11.9*  HCT 36.7  MCV 94.6  PLT 230   Basic Metabolic Panel: Recent Labs  Lab 08/17/17 0915  NA 140   K 3.7  CL 105  CO2 26  GLUCOSE 116*  BUN 25*  CREATININE 1.03*  CALCIUM 8.8*   GFR: Estimated Creatinine Clearance: 27.8 mL/min (A) (by C-G formula based on SCr of 1.03 mg/dL (H)). Liver Function Tests: Recent Labs  Lab 08/17/17 0915  AST 32  ALT 19  ALKPHOS 58  BILITOT 0.9  PROT 7.0  ALBUMIN 3.5   No results for input(s): LIPASE, AMYLASE in the last 168 hours. No results for input(s): AMMONIA in the last 168 hours. Coagulation Profile: Recent Labs  Lab 08/17/17 0915  INR 0.95   Cardiac Enzymes: No results for input(s): CKTOTAL, CKMB, CKMBINDEX, TROPONINI in the last 168 hours. BNP (last 3 results) No results for input(s): PROBNP in the last 8760 hours. HbA1C: No results for input(s): HGBA1C in the last 72 hours. CBG: Recent Labs  Lab 08/17/17 0939  GLUCAP 95   Lipid Profile: No results for input(s): CHOL, HDL, LDLCALC, TRIG, CHOLHDL, LDLDIRECT in the last 72 hours. Thyroid Function Tests: No results for input(s): TSH, T4TOTAL, FREET4, T3FREE, THYROIDAB in the last 72 hours. Anemia Panel: No results for input(s): VITAMINB12, FOLATE, FERRITIN, TIBC, IRON, RETICCTPCT in the last 72 hours. Urine analysis:    Component Value Date/Time   COLORURINE YELLOW 08/17/2017 1237   APPEARANCEUR HAZY (A) 08/17/2017 1237   LABSPEC 1.023 08/17/2017 1237   PHURINE 5.0 08/17/2017 1237   GLUCOSEU NEGATIVE 08/17/2017 1237   HGBUR NEGATIVE 08/17/2017 1237   BILIRUBINUR NEGATIVE 08/17/2017 1237   KETONESUR NEGATIVE 08/17/2017 1237   PROTEINUR 30 (A) 08/17/2017 1237   UROBILINOGEN 0.2 04/25/2014 1827   NITRITE NEGATIVE 08/17/2017 1237   LEUKOCYTESUR NEGATIVE 08/17/2017 1237   Sepsis Labs: Invalid input(s): PROCALCITONIN, LACTICIDVEN  Recent Results (from the past 240 hour(s))  Blood Culture (routine x 2)     Status: None (Preliminary result)   Collection Time: 08/17/17  9:15 AM  Result Value Ref Range Status   Specimen Description BLOOD RIGHT ANTECUBITAL  Final    Special Requests   Final    BOTTLES DRAWN AEROBIC AND ANAEROBIC Blood Culture adequate volume Performed at [facility], [address] [place], [place] [address]    Culture PENDING  Incomplete   Report Status PENDING  Incomplete  Blood Culture (routine x 2)     Status: None (Preliminary result)   Collection Time: 08/17/17  9:15 AM  Result Value Ref Range Status   Specimen Description BLOOD LEFT ANTECUBITAL  Final   Special Requests   Final    BOTTLES DRAWN AEROBIC AND ANAEROBIC Blood Culture adequate volume Performed at [facility], [address] [place], [place] [address]    Culture PENDING  Incomplete   Report Status PENDING  Incomplete  MRSA PCR Screening     Status: None   Collection Time: 08/17/17 12:25 PM  Result Value Ref Range Status   MRSA by PCR NEGATIVE NEGATIVE Final    Comment:        The GeneXpert MRSA Assay (FDA approved for NASAL specimens only), is one component of a comprehensive  MRSA colonization surveillance program. It is not intended to diagnose MRSA infection nor to guide or monitor treatment for MRSA infections.       Radiology Studies: Dg Chest 2 View  Result Date: 08/17/2017 CLINICAL DATA:  Sepsis, cough, COPD EXAM: CHEST  2 VIEW COMPARISON:  04/17/2014 FINDINGS: The lungs are hyperinflated likely secondary to COPD. There is right lower lobe hazy airspace disease concerning for pneumonia. There is no pleural effusion or pneumothorax. There is mild stable cardiomegaly. There is thoracic aortic atherosclerosis. The osseous structures are unremarkable. There is a spinal stimulator with the metallic portion projecting over the midthoracic spine. IMPRESSION: 1. Right lower lobe hazy airspace disease concerning for pneumonia. 2. COPD. Electronically Signed   By: Elige KoHetal  Patel   On: 08/17/2017 09:37   Ct Head Wo Contrast  Result Date: 08/17/2017 CLINICAL DATA:  Altered level of consciousness. EXAM: CT HEAD WITHOUT CONTRAST TECHNIQUE:  Contiguous axial images were obtained from the base of the skull through the vertex without intravenous contrast. COMPARISON:  CT scan of March 04, 2015. FINDINGS: Brain: Mild diffuse cortical atrophy is noted. Mild chronic ischemic white matter disease is noted. No mass effect or midline shift is noted. Ventricular size is within normal limits. There is no evidence of mass lesion, hemorrhage or acute infarction. Vascular: No hyperdense vessel or unexpected calcification. Skull: Normal. Negative for fracture or focal lesion. Sinuses/Orbits: Bilateral maxillary, sphenoid and ethmoid sinusitis is noted. Other: None. IMPRESSION: Mild diffuse cortical atrophy. Mild chronic ischemic white matter disease. Bilateral ethmoid, sphenoid and maxillary sinusitis. No acute intracranial abnormality seen. Electronically Signed   By: Lupita RaiderJames  Green Jr, M.D.   On: 08/17/2017 10:55     Scheduled Meds: . aspirin EC  81 mg Oral Daily  . cholecalciferol  1,000 Units Oral Daily  . enoxaparin (LOVENOX) injection  30 mg Subcutaneous Q24H  . fentaNYL  25 mcg Transdermal Q72H  . fluticasone furoate-vilanterol  1 puff Inhalation QHS  . LORazepam  0.5 mg Oral BID  . magnesium oxide  200 mg Oral Daily  . pantoprazole  40 mg Oral BID WC  . pramipexole  1 mg Oral QHS  . QUEtiapine  100 mg Oral Once per day on Sun Tue Thu Sat  . sertraline  25 mg Oral QHS  . vitamin B-12  100 mcg Oral Daily   Continuous Infusions: . sodium chloride 100 mL/hr at 08/18/17 0500  . aztreonam Stopped (08/18/17 0547)  . doxycycline (VIBRAMYCIN) IV Stopped (08/17/17 2204)  . vancomycin       Pamella Pertostin Gherghe, MD, PhD [facility] Pager [phone]  If 7PM-7AM, please contact night-coverage [email or web address] Password [email or web address] 08/18/2017, 6:10 AM

## 2017-08-19 DIAGNOSIS — I456 Pre-excitation syndrome: Secondary | ICD-10-CM

## 2017-08-19 LAB — CBC
HEMATOCRIT: 33.5 % — AB (ref 36.0–46.0)
HEMOGLOBIN: 10.5 g/dL — AB (ref 12.0–15.0)
MCH: 29.7 pg (ref 26.0–34.0)
MCHC: 31.3 g/dL (ref 30.0–36.0)
MCV: 94.6 fL (ref 78.0–100.0)
Platelets: 232 10*3/uL (ref 150–400)
RBC: 3.54 MIL/uL — AB (ref 3.87–5.11)
RDW: 13.2 % (ref 11.5–15.5)
WBC: 5.1 10*3/uL (ref 4.0–10.5)

## 2017-08-19 LAB — BASIC METABOLIC PANEL
Anion gap: 5 (ref 5–15)
BUN: 11 mg/dL (ref 6–20)
CHLORIDE: 111 mmol/L (ref 101–111)
CO2: 24 mmol/L (ref 22–32)
Calcium: 8.2 mg/dL — ABNORMAL LOW (ref 8.9–10.3)
Creatinine, Ser: 0.67 mg/dL (ref 0.44–1.00)
GFR calc non Af Amer: >60 mL/min (ref >60)
Glucose, Bld: 78 mg/dL (ref 65–99)
POTASSIUM: 3.9 mmol/L (ref 3.5–5.1)
Sodium: 140 mmol/L (ref 135–145)

## 2017-08-19 MED ORDER — HYDRALAZINE HCL 20 MG/ML IJ SOLN
10.0000 mg | Freq: Three times a day (TID) | INTRAMUSCULAR | Status: DC | PRN
  Filled 2017-08-19: qty 1

## 2017-08-19 MED ORDER — AMLODIPINE BESYLATE 5 MG PO TABS
5.0000 mg | ORAL_TABLET | Freq: Every day | ORAL | Status: DC
  Administered 2017-08-19 – 2017-08-23 (×5): 5 mg via ORAL
  Filled 2017-08-19 (×5): qty 1

## 2017-08-19 MED ORDER — SODIUM CHLORIDE 0.9 % IV SOLN: INTRAVENOUS | Status: AC

## 2017-08-19 MED ORDER — LOSARTAN POTASSIUM 50 MG PO TABS
50.0000 mg | ORAL_TABLET | Freq: Every day | ORAL | Status: DC
  Administered 2017-08-19 – 2017-08-23 (×5): 50 mg via ORAL
  Filled 2017-08-19 (×5): qty 1

## 2017-08-19 NOTE — Progress Notes (Signed)
[facility] PROGRESS NOTE  [name] ZOX:[id number] DOB: 07/29/1929 DOA: 08/17/2017 PCP: [name], MD  Brief summary   83 y.o.femalewith medical history significant ofprobable dementia, chronic combined systolic and diastolic CHF, COPD, hypertension, prior tobacco abuse, is being brought to the hospital by the daughter for increased confusion and hallucinations at home.  She was septic in the emergency room, and chest x-ray showed right lower lobe pneumonia.  She was started on broad-spectrum antibiotics and admitted to stepdown.  Strep pneumo antigen returned positive.     Assessment/Plan:  Active Problems:   Tobacco use disorder   WPW (Wolff-Parkinson-White syndrome)   Sepsis (HCC)   Chronic combined systolic and diastolic CHF (congestive heart failure) (HCC)   Right lower lobe pneumonia (HCC)   Sepsis due to right lower lobe pneumonia, pneumococcal pneumonia. Patient presented to the emergency room with sepsis, meeting criteria with high fever, tachycardia, and a source of the chest x-ray. She was influenza negative, strep pneumo positive. Sepsis physiology has improved -She has multiple medication allergies, deescalate antibiotics in AM    Chronic combined systolic and diastolic CHF.She was to see Dr. [name] with cardiology in the past, most recent cardiology evaluation that I see was back in 2014, and it was mentioned that her echo at that time had an EF of about 45-50%. -She remains euvolemic, no lower extremity edema, stable respiratory status  History of forWVW. status post ablation in the past by Dr. [name]  COPD. No wheezing, she has coarse breath sounds in the setting of pneumonia, continue home nebulizers, continue to hold steroids for now  Tobacco abuse, in remission. Quit 2 years ago  Dementia with behavioral disturbances. Most likely has underlying dementia as per her daughter over the last year she has been having intermittent episodes of  confusion, "she has good days and she has bad days" -More agitated and combative overnight, required Haldol    DVT prophylaxis: Lovenox Code Status: DNR Family Communication: No family at bedside this morning Disposition Plan: Transfer to [facility]. Obtain pt eval   Consultants:   None   Procedures:   None   Antimicrobials:  Vancomycin 12/20 >>  Doxycycline 12/20 >>  Aztreonam 12/20 >>    HPI/Subjective: Alert. No distress. Confused at baseline. No focal weakness. Denies acute pains   Objective: Vitals:   08/19/17 0700 08/19/17 0800  BP: (!) 178/79 (!) 170/80  Pulse: 80 82  Resp: 17 11  Temp:  98.4 F (36.9 C)  SpO2: 99% 97%    Intake/Output Summary (Last 24 hours) at 08/19/2017 0827 Last data filed at 08/18/2017 1500 Gross per 24 hour  Intake 1000 ml  Output -  Net 1000 ml   Filed Weights   08/17/17 0847  Weight: 44.9 kg (99 lb)    Exam:   General:  Alert. No dsitress   Cardiovascular: s1,s2 rrr  Respiratory: CTA BL  Abdomen: soft, nt, nd   Musculoskeletal: no leg edema    Data Reviewed: Basic Metabolic Panel: Recent Labs  Lab 08/17/17 0915 08/18/17 0633 08/19/17 0324  NA 140 140 140  K 3.7 3.3* 3.9  CL 105 111 111  CO2 26 24 24   GLUCOSE 116* 98 78  BUN 25* 14 11  CREATININE 1.03* 0.64 0.67  CALCIUM 8.8* 7.9* 8.2*   Liver Function Tests: Recent Labs  Lab 08/17/17 0915 08/18/17 0633  AST 32 25  ALT 19 19  ALKPHOS 58 47  BILITOT 0.9 0.6  PROT 7.0 5.7*  ALBUMIN 3.5  2.7*   No results for input(s): LIPASE, AMYLASE in the last 168 hours. No results for input(s): AMMONIA in the last 168 hours. CBC: Recent Labs  Lab 08/17/17 0915 08/18/17 0633 08/19/17 0324  WBC 11.3* 6.3 5.1  HGB 11.9* 9.6* 10.5*  HCT 36.7 29.7* 33.5*  MCV 94.6 92.8 94.6  PLT 230 172 232   Cardiac Enzymes: No results for input(s): CKTOTAL, CKMB, CKMBINDEX, TROPONINI in the last 168 hours. BNP (last 3 results) No results for input(s): BNP in the  last 8760 hours.  ProBNP (last 3 results) No results for input(s): PROBNP in the last 8760 hours.  CBG: Recent Labs  Lab 08/17/17 0939  GLUCAP 95    Recent Results (from the past 240 hour(s))  Blood Culture (routine x 2)     Status: None (Preliminary result)   Collection Time: 08/17/17  9:15 AM  Result Value Ref Range Status   Specimen Description BLOOD RIGHT ANTECUBITAL  Final   Special Requests   Final    BOTTLES DRAWN AEROBIC AND ANAEROBIC Blood Culture adequate volume   Culture   Final    NO GROWTH 1 DAY Performed at [facility], [address] [place], [place] [address]    Report Status PENDING  Incomplete  Blood Culture (routine x 2)     Status: None (Preliminary result)   Collection Time: 08/17/17  9:15 AM  Result Value Ref Range Status   Specimen Description BLOOD LEFT ANTECUBITAL  Final   Special Requests   Final    BOTTLES DRAWN AEROBIC AND ANAEROBIC Blood Culture adequate volume   Culture   Final    NO GROWTH 1 DAY Performed at [facility], [address] [place], [place] [address]    Report Status PENDING  Incomplete  MRSA PCR Screening     Status: None   Collection Time: 08/17/17 12:25 PM  Result Value Ref Range Status   MRSA by PCR NEGATIVE NEGATIVE Final    Comment:        The GeneXpert MRSA Assay (FDA approved for NASAL specimens only), is one component of a comprehensive MRSA colonization surveillance program. It is not intended to diagnose MRSA infection nor to guide or monitor treatment for MRSA infections.      Studies: Dg Chest 2 View  Result Date: 08/17/2017 CLINICAL DATA:  Sepsis, cough, COPD EXAM: CHEST  2 VIEW COMPARISON:  04/17/2014 FINDINGS: The lungs are hyperinflated likely secondary to COPD. There is right lower lobe hazy airspace disease concerning for pneumonia. There is no pleural effusion or pneumothorax. There is mild stable cardiomegaly. There is thoracic aortic atherosclerosis. The osseous structures are  unremarkable. There is a spinal stimulator with the metallic portion projecting over the midthoracic spine. IMPRESSION: 1. Right lower lobe hazy airspace disease concerning for pneumonia. 2. COPD. Electronically Signed   By: Elige KoHetal  Patel   On: 08/17/2017 09:37   Ct Head Wo Contrast  Result Date: 08/17/2017 CLINICAL DATA:  Altered level of consciousness. EXAM: CT HEAD WITHOUT CONTRAST TECHNIQUE: Contiguous axial images were obtained from the base of the skull through the vertex without intravenous contrast. COMPARISON:  CT scan of March 04, 2015. FINDINGS: Brain: Mild diffuse cortical atrophy is noted. Mild chronic ischemic white matter disease is noted. No mass effect or midline shift is noted. Ventricular size is within normal limits. There is no evidence of mass lesion, hemorrhage or acute infarction. Vascular: No hyperdense vessel or unexpected calcification. Skull: Normal. Negative for fracture or  focal lesion. Sinuses/Orbits: Bilateral maxillary, sphenoid and ethmoid sinusitis is noted. Other: None. IMPRESSION: Mild diffuse cortical atrophy. Mild chronic ischemic white matter disease. Bilateral ethmoid, sphenoid and maxillary sinusitis. No acute intracranial abnormality seen. Electronically Signed   By: Lupita RaiderJames  Green Jr, M.D.   On: 08/17/2017 10:55    Scheduled Meds: . amLODipine  5 mg Oral Daily  . aspirin EC  81 mg Oral Daily  . cholecalciferol  1,000 Units Oral Daily  . enoxaparin (LOVENOX) injection  30 mg Subcutaneous Q24H  . fentaNYL  25 mcg Transdermal Q72H  . fluticasone furoate-vilanterol  1 puff Inhalation QHS  . LORazepam  0.5 mg Oral BID  . losartan  50 mg Oral Daily  . magnesium oxide  200 mg Oral Daily  . pantoprazole  40 mg Oral BID WC  . pramipexole  1 mg Oral QHS  . QUEtiapine  100 mg Oral Once per day on Sun Tue Thu Sat  . sertraline  25 mg Oral QHS  . vitamin B-12  100 mcg Oral Daily   Continuous Infusions: . sodium chloride    . aztreonam 500 mg (08/19/17 0534)  .  doxycycline (VIBRAMYCIN) IV Stopped (08/18/17 2358)  . vancomycin Stopped (08/18/17 2359)    Active Problems:   Tobacco use disorder   WPW (Wolff-Parkinson-White syndrome)   Sepsis (HCC)   Chronic combined systolic and diastolic CHF (congestive heart failure) (HCC)   Right lower lobe pneumonia (HCC)    Time spent: >35 minutes     Esperanza SheetsBURIEV, Clydean Posas N  [facility] Pager [phone]. If 7PM-7AM, please contact night-coverage at [email or web address], password [email or web address] 08/19/2017, 8:27 AM  LOS: 2 days

## 2017-08-19 NOTE — Plan of Care (Signed)
  Pain Managment: General experience of comfort will improve 08/19/2017 2059 - Completed/Met by Mickie Kay, RN

## 2017-08-19 NOTE — Consult Note (Signed)
Consult for establishing goals of care.  Discussion with Daryl Easternarol Johnston, patient daughter and also HCPOA.  Code status has already been determined and confirmed with Okey Regalarol, also exists on Advanced Directive documentation.  Mrs. Reed PandyRamsey has suffered from dementia for about the past 10 years.  Additional significant medical history includes COPD, CHF, HTN.  For full medical history see H&P.  [name] provides extensive information related to her mothers care.  Mrs. Battaglia lives in her own condo.  Her son, (Carols brother) has lived with her for several years.  He hasn't needed to provide any care giving up to this point for his mother.  He currently works overnight from 11p-4a at [employer].  This is beginning to become problematic with Mrs. Arzola's worsening mental status.  [name] is able to reflect over the past several months that her mother has become increasingly paranoid as well as leaving her home on foot being found by a neighbor wondering on the property.  She has had a few incontinent situations-appears to be unable to find the bathroom.  Her son was unable to recognize to increased confusion according to Farmerarol.   These behaviors began prior to this acute illness but have been exacerbated since Thursday when she was seen in the ED.  Goals of care are clear to treat any medical conditions that are treatable.  Manage optimal health within the parameters of chronic/life limiting conditions.  Established DNR with Advanced Directives.  She may likely have a new baseline health status from this acute illness.  Functional status may likely be deteriorated, PT has been consulted.  I discussed with [name] the possibility of short term rehab.  Lack of supervision 24/7 at home poses many risks.    Plan is to continue with medical treatment for this acute sepsis and pneumonia.  PT evaluate needs.  Determine transition plan for discharge that will support these new developments in her functional/mental status.  Will  follow for support as needed.  [name] is very open to supporting the changing needs of her mother.  Case discussed with Dr. Neale BurlyFreeman at length.    Cindra PresumeSue Ellen Kelechi Orgeron, MSN, RN-BC, [facility]

## 2017-08-19 NOTE — Plan of Care (Signed)
  Nutrition: Adequate nutrition will be maintained 08/19/2017 [id number] - Progressing by William Daltonarpenter, Jaryn Hocutt L, RN   Elimination: Will not experience complications related to bowel motility 08/19/2017 0822 - Progressing by William Daltonarpenter, Jessicalynn Deshong L, RN   Pain Managment: General experience of comfort will improve 08/19/2017 0822 - Progressing by William Daltonarpenter, Keturah Yerby L, RN

## 2017-08-19 NOTE — Progress Notes (Signed)
  Speech Language Pathology Treatment: Dysphagia  Patient Details Name: QUANNA WITTKE MRN: [id number] DOB: 22-Sep-1928 Today's Date: 08/19/2017 Time: 9747-1855 SLP Time Calculation (min) (ACUTE ONLY): 10 min  Assessment / Plan / Recommendation Clinical Impression  Pt much improved today - alert, interactive, communicating.  Pt consumed solids, thin liquids with excellent attention to and mastication of POs, brisk swallow response, no overt s/s of aspiration.  Recommend resuming soft diet, thin liquids; give meds crushed in puree (due to chronic difficulty swallowing whole pills).  No further SLP f/u warranted - our services will sign off.    HPI HPI: 83 y.o. female with medical history significant for probable dementia, chronic combined systolic and diastolic CHF, COPD, hypertension, prior tobacco abuse, is being brought to the hospital by the daughter for increased confusion and hallucinations at home. She was septic in the emergency room, and chest x-ray showed right lower lobe pneumonia. Per records, no prior swallow studies.  Hx ACDF C4-7.  Daughter, Arbie Cookey, reports increased coughing with meals recently.       SLP Plan  All goals met       Recommendations  Diet recommendations: Dysphagia 3 (mechanical soft);Thin liquid Liquids provided via: Cup;Straw Medication Administration: Crushed with puree Supervision: Patient able to self feed                Oral Care Recommendations: Oral care BID Follow up Recommendations: None SLP Visit Diagnosis: Dysphagia, unspecified (R13.10) Plan: All goals met       GO                Juan Quam Laurice 08/19/2017, 3:35 PM

## 2017-08-20 DIAGNOSIS — I5042 Chronic combined systolic (congestive) and diastolic (congestive) heart failure: Secondary | ICD-10-CM

## 2017-08-20 LAB — CREATININE, SERUM
CREATININE: 0.67 mg/dL (ref 0.44–1.00)
GFR calc Af Amer: >60 mL/min (ref >60)
GFR calc non Af Amer: >60 mL/min (ref >60)

## 2017-08-20 LAB — LACTIC ACID, PLASMA
Lactic Acid, Venous: 0.6 mmol/L (ref 0.5–1.9)
Lactic Acid, Venous: 1.1 mmol/L (ref 0.5–1.9)

## 2017-08-20 MED ORDER — LORAZEPAM 2 MG/ML IJ SOLN
0.5000 mg | Freq: Four times a day (QID) | INTRAMUSCULAR | Status: DC | PRN
Start: 2017-08-20 — End: 2017-08-23
  Administered 2017-08-20 – 2017-08-21 (×2): 0.5 mg via INTRAVENOUS
  Filled 2017-08-20 (×2): qty 1

## 2017-08-20 MED ORDER — DEXTROSE 5 % IV SOLN
1.0000 g | Freq: Three times a day (TID) | INTRAVENOUS | Status: DC
  Administered 2017-08-20 – 2017-08-21 (×2): 1 g via INTRAVENOUS
  Filled 2017-08-20 (×3): qty 1

## 2017-08-20 MED ORDER — HALOPERIDOL 0.5 MG PO TABS
0.5000 mg | ORAL_TABLET | Freq: Four times a day (QID) | ORAL | Status: DC | PRN
  Administered 2017-08-20 – 2017-08-22 (×3): 0.5 mg via ORAL
  Filled 2017-08-20 (×3): qty 1

## 2017-08-20 MED ORDER — HALOPERIDOL LACTATE 5 MG/ML IJ SOLN
1.0000 mg | Freq: Four times a day (QID) | INTRAMUSCULAR | Status: AC | PRN
  Administered 2017-08-20 (×2): 1 mg via INTRAVENOUS
  Filled 2017-08-20 (×2): qty 1

## 2017-08-20 NOTE — Progress Notes (Signed)
Patient ID: [name], female   DOB: Sep 05, 1928, 83 y.o.   MRN: [id number]  PROGRESS NOTE    Deborah MoronBetty B [name]  WUJ:[id number] DOB: Sep 05, 1928 DOA: 08/17/2017  PCP: Marden NobleGates, Robert, MD   Brief Narrative:  83 y.o.femalewith medical history significant ofprobable dementia, chronic combined systolic and diastolic CHF, COPD, hypertension, prior tobacco abuse, is being brought to the hospital by the daughter for increased confusion and hallucinations at home.She was septic in the emergency room, and chest x-ray showed right lower lobe pneumonia. She was started on broad-spectrum antibiotics and admitted to stepdown. Strep pneumo antigen returned positive.   Assessment & Plan:   Sepsis due to right lower lobe pneumonia, pneumococcal pneumonia - Patient presented to the emergency room with sepsis, meeting criteria with high fever, tachycardia, and a source of the chest x-ray. She was influenza negative, strep pneumo positive - Continue current abx regimen: azithro, vanco and doxycycline   Chronic combined systolic and diastolic CHF - She was to see Dr. Katrinka BlazingSmith with cardiology in the past, most recent cardiology evaluation that I see was back in 2014, and it was mentioned that her echo at that time had an EF of about 45-50%. 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 Graciela HusbandsKlein  COPD - Stable resp status   Tobacco abuse, in remission - Quit 2 years ago  Dementiawith behavioral disturbances - Stable mental status this am   DVT prophylaxis: Lovenox subQ Code Status: DNR/DNI  Family Communication: no family at the bedside Disposition Plan: anticipate d/c in next 24-48 hours   Consultants:   SLP  Procedures:   None   Antimicrobials:   Vanco 12/20 -->  Doxycycline 12/20 -->  Azithromycin 12/20 -->   Subjective: No overnight events.  Objective: Vitals:   08/19/17 1848 08/19/17 1946 08/20/17 0055 08/20/17 0617  BP: (!)  149/79  132/87 118/83  Pulse: (!) 107  98 93  Resp: 20  14 13   Temp: 98.4 F (36.9 C)  98.2 F (36.8 C)   TempSrc: Oral  Oral   SpO2: 96% 94% 93% 93%  Weight:      Height: 5\' 1"  (1.549 m)       Intake/Output Summary (Last 24 hours) at 08/20/2017 1042 Last data filed at 08/19/2017 2003 Gross per 24 hour  Intake 1365.83 ml  Output -  Net 1365.83 ml   Filed Weights   08/17/17 0847  Weight: 44.9 kg (99 lb)    Examination:  General exam: Appears calm and comfortable  Respiratory system: Clear to auscultation. Respiratory effort normal. Cardiovascular system: S1 & S2 heard, RRR.  Gastrointestinal system: Abdomen is nondistended, soft and nontender. No organomegaly or masses felt. Normal bowel sounds heard. Central nervous system: No focal neurological deficits. Extremities: Symmetric 5 x 5 power. Skin: No rashes, lesions or ulcers Psychiatry: Judgement and insight appear normal. Mood & affect appropriate.   Data Reviewed: I have personally reviewed following labs and imaging studies  CBC: Recent Labs  Lab 08/17/17 0915 08/18/17 0633 08/19/17 0324  WBC 11.3* 6.3 5.1  HGB 11.9* 9.6* 10.5*  HCT 36.7 29.7* 33.5*  MCV 94.6 92.8 94.6  PLT 230 172 232   Basic Metabolic Panel: Recent Labs  Lab 08/17/17 0915 08/18/17 0633 08/19/17 0324 08/20/17 0504  NA 140 140 140  --   K 3.7 3.3* 3.9  --   CL 105 111 111  --   CO2 26 24 24   --  GLUCOSE 116* 98 78  --   BUN 25* 14 11  --   CREATININE 1.03* 0.64 0.67 0.67  CALCIUM 8.8* 7.9* 8.2*  --    GFR: Estimated Creatinine Clearance: 35.8 mL/min (by C-G formula based on SCr of 0.67 mg/dL). Liver Function Tests: Recent Labs  Lab 08/17/17 0915 08/18/17 0633  AST 32 25  ALT 19 19  ALKPHOS 58 47  BILITOT 0.9 0.6  PROT 7.0 5.7*  ALBUMIN 3.5 2.7*   No results for input(s): LIPASE, AMYLASE in the last 168 hours. No results for input(s): AMMONIA in the last 168 hours. Coagulation Profile: Recent Labs  Lab  08/17/17 0915  INR 0.95   Cardiac Enzymes: No results for input(s): CKTOTAL, CKMB, CKMBINDEX, TROPONINI in the last 168 hours. BNP (last 3 results) No results for input(s): PROBNP in the last 8760 hours. HbA1C: No results for input(s): HGBA1C in the last 72 hours. CBG: Recent Labs  Lab 08/17/17 0939  GLUCAP 95   Lipid Profile: No results for input(s): CHOL, HDL, LDLCALC, TRIG, CHOLHDL, LDLDIRECT in the last 72 hours. Thyroid Function Tests: No results for input(s): TSH, T4TOTAL, FREET4, T3FREE, THYROIDAB in the last 72 hours. Anemia Panel: No results for input(s): VITAMINB12, FOLATE, FERRITIN, TIBC, IRON, RETICCTPCT in the last 72 hours. Urine analysis:    Component Value Date/Time   COLORURINE YELLOW 08/17/2017 1237   APPEARANCEUR HAZY (A) 08/17/2017 1237   LABSPEC 1.023 08/17/2017 1237   PHURINE 5.0 08/17/2017 1237   GLUCOSEU NEGATIVE 08/17/2017 1237   HGBUR NEGATIVE 08/17/2017 1237   BILIRUBINUR NEGATIVE 08/17/2017 1237   KETONESUR NEGATIVE 08/17/2017 1237   PROTEINUR 30 (A) 08/17/2017 1237   UROBILINOGEN 0.2 04/25/2014 1827   NITRITE NEGATIVE 08/17/2017 1237   LEUKOCYTESUR NEGATIVE 08/17/2017 1237   Sepsis Labs: @LABRCNTIP (procalcitonin:4,lacticidven:4)   Blood Culture (routine x 2)     Status: None (Preliminary result)   Collection Time: 08/17/17  9:15 AM  Result Value Ref Range Status   Specimen Description BLOOD RIGHT ANTECUBITAL  Final   Special Requests   Final    BOTTLES DRAWN AEROBIC AND ANAEROBIC Blood Culture adequate volume   Culture   Final    NO GROWTH 2 DAYS Performed at [facility], [address] [place], [place] [address]    Report Status PENDING  Incomplete  Blood Culture (routine x 2)     Status: None (Preliminary result)   Collection Time: 08/17/17  9:15 AM  Result Value Ref Range Status   Specimen Description BLOOD LEFT ANTECUBITAL  Final   Special Requests   Final    BOTTLES DRAWN AEROBIC AND ANAEROBIC Blood Culture adequate  volume   Culture   Final    NO GROWTH 2 DAYS Performed at [facility], [address] [place], [place] [address]    Report Status PENDING  Incomplete  MRSA PCR Screening     Status: None   Collection Time: 08/17/17 12:25 PM  Result Value Ref Range Status   MRSA by PCR NEGATIVE NEGATIVE Final      Radiology Studies: Dg Chest 2 View  Result Date: 08/17/2017 CLINICAL DATA:  Sepsis, cough, COPD EXAM: CHEST  2 VIEW COMPARISON:  04/17/2014 FINDINGS: The lungs are hyperinflated likely secondary to COPD. There is right lower lobe hazy airspace disease concerning for pneumonia. There is no pleural effusion or pneumothorax. There is mild stable cardiomegaly. There is thoracic aortic atherosclerosis. The osseous structures are unremarkable. There is a spinal stimulator with the metallic  portion projecting over the midthoracic spine. IMPRESSION: 1. Right lower lobe hazy airspace disease concerning for pneumonia. 2. COPD. Electronically Signed   By: Elige Ko   On: 08/17/2017 09:37   Ct Head Wo Contrast  Result Date: 08/17/2017 CLINICAL DATA:  Altered level of consciousness. EXAM: CT HEAD WITHOUT CONTRAST TECHNIQUE: Contiguous axial images were obtained from the base of the skull through the vertex without intravenous contrast. COMPARISON:  CT scan of March 04, 2015. FINDINGS: Brain: Mild diffuse cortical atrophy is noted. Mild chronic ischemic white matter disease is noted. No mass effect or midline shift is noted. Ventricular size is within normal limits. There is no evidence of mass lesion, hemorrhage or acute infarction. Vascular: No hyperdense vessel or unexpected calcification. Skull: Normal. Negative for fracture or focal lesion. Sinuses/Orbits: Bilateral maxillary, sphenoid and ethmoid sinusitis is noted. Other: None. IMPRESSION: Mild diffuse cortical atrophy. Mild chronic ischemic white matter disease. Bilateral ethmoid, sphenoid and maxillary sinusitis. No acute intracranial abnormality  seen. Electronically Signed   By: Lupita Raider, M.D.   On: 08/17/2017 10:55        Scheduled Meds: . amLODipine  5 mg Oral Daily  . aspirin EC  81 mg Oral Daily  . cholecalciferol  1,000 Units Oral Daily  . enoxaparin (LOVENOX) injection  30 mg Subcutaneous Q24H  . fentaNYL  25 mcg Transdermal Q72H  . fluticasone furoate-vilanterol  1 puff Inhalation QHS  . LORazepam  0.5 mg Oral BID  . losartan  50 mg Oral Daily  . magnesium oxide  200 mg Oral Daily  . pantoprazole  40 mg Oral BID WC  . pramipexole  1 mg Oral QHS  . QUEtiapine  100 mg Oral Once per day on Sun Tue Thu Sat  . sertraline  25 mg Oral QHS  . vitamin B-12  100 mcg Oral Daily   Continuous Infusions: . aztreonam Stopped (08/20/17 0725)  . doxycycline (VIBRAMYCIN) IV Stopped (08/20/17 1035)  . vancomycin Stopped (08/18/17 2359)     LOS: 3 days    Time spent: 25 minutes  Greater than 50% of the time spent on counseling and coordinating the care.   [name], MD [facility] Pager [phone]  If 7PM-7AM, please contact night-coverage [email or web address] Password [facility] 08/20/2017, 10:42 AM

## 2017-08-20 NOTE — Clinical Social Work Note (Signed)
Clinical Social Work Assessment  Patient Details  Name: [name] MRN: [id number] Date of Birth: Aug 09, 1929  Date of referral:  08/20/17               Reason for consult:  Facility Placement                Permission sought to share information with:  Facility Industrial/product designerContact Representative Permission granted to share information::  No(pt disoriented- spoke with next of kin)  Name::     Insurance underwriterCarol  Agency::  SNF  Relationship::  dtr  Contact Information:     Housing/Transportation Living arrangements for the past 2 months:  Single Family Home Source of Information:  Adult Children Patient Interpreter Needed:  None Criminal Activity/Legal Involvement Pertinent to Current Situation/Hospitalization:  No - Comment as needed Significant Relationships:  Adult Children Lives with:  Adult Children(son) Do you feel safe going back to the place where you live?  No Need for family participation in patient care:  Yes (Comment)(ADL assistance and supervision)  Care giving concerns:  Pt lives at home with son who is primary caregiver.  Pt son still works but only in the evenings for 4 hours- usually pt is asleep at this time.  Pt has become more confused and weak over past 10 days and now they believe pt would be too much to handle at home.   Social Worker assessment / plan:  CSW spoke with pt dtr concerning short term rehab recommendation and also possible long term care needs.  CSW explained SNF and SNF referral process to pt dtr.  Pt dtr expresses understanding.  Pt dtr has concerns about possible LTC needs.  CSW discussed differences between short term rehab and LTC facilities.  Explained need for family to look into LTC and that this would be difficult to do from hospital without rushing the process.  Pt dtr has already thought about LTC and understands it would have to be paid for privately- believes pt savings could cover 1-2 years at ALF.  Employment status:  Retired Automotive engineernsurance information:   Managed Medicare PT Recommendations:  Skilled Nursing Facility Information / Referral to community resources:  Skilled Nursing Facility  Patient/Family's Response to care:  Agreeable to plan for pt to go to rehab center to get back to baseline mobility and she would work on LTC placement in the meantime.  Patient/Family's Understanding of and Emotional Response to Diagnosis, Current Treatment, and Prognosis:  Dtr has good understanding of pt condition and realistic about possible long term needs.  Emotional Assessment Appearance:  Appears stated age Attitude/Demeanor/Rapport:    Affect (typically observed):  Appropriate Orientation:  Oriented to Self Alcohol / Substance use:  Not Applicable Psych involvement (Current and /or in the community):  No (Comment)  Discharge Needs  Concerns to be addressed:  Care Coordination Readmission within the last 30 days:  No Current discharge risk:  Physical Impairment Barriers to Discharge:  Continued Medical Work up   Burna SisUris, [name], LCSW 08/20/2017, 2:28 PM

## 2017-08-20 NOTE — Progress Notes (Addendum)
Pharmacy Antibiotic Note  [name] is a 83 y.o. female admitted on 08/17/2017 with sepsis d/t RLL pneumonia.  Pharmacy has been consulted for vancomycin and aztreonam dosing. Patient found to have strep pneumo PNA and not multiple allergies  Plan: Day 4 Antibiotics Continue vancomycin 750 mg IV q48 hr (est AUC 466 based on SCr 1.03)  Measure vancomycin AUC at steady state as indicated  Will adjust aztreonam from 500mg  IV q8 to 1g q8 for CrCl > 30 ml/min  Aztreonam and doxy dosing remains appropriate but would recommend to discontinue and continue vanc for further treatment of strep pneumo PNA as gram neg coverage no longer needed   Height: 5\' 1"  (154.9 cm) Weight: 99 lb (44.9 kg) IBW/kg (Calculated) : 47.8  Temp (24hrs), Avg:98.1 F (36.7 C), Min:97.8 F (36.6 C), Max:98.4 F (36.9 C)  Recent Labs  Lab 08/17/17 0915 08/17/17 0925 08/17/17 1122 08/18/17 0633 08/19/17 0324 08/20/17 0504  WBC 11.3*  --   --  6.3 5.1  --   CREATININE 1.03*  --   --  0.64 0.67 0.67  LATICACIDVEN  --  2.19* 1.36  --   --   --     Estimated Creatinine Clearance: 35.8 mL/min (by C-G formula based on SCr of 0.67 mg/dL).    Allergies  Allergen Reactions  . Amoxicillin Anaphylaxis and Rash  . Ciprofloxacin Anaphylaxis and Other (See Comments)    Thrush  . Lorabid [Loracarbef] Anaphylaxis and Rash  . Avelox [Moxifloxacin] Other (See Comments)    Tendon and joint pain  . Erythromycin Other (See Comments)    angioedema  . Inderal [Propranolol] Other (See Comments)    Vasculitis  . Chantix [Varenicline] Nausea Only    Antimicrobials this admission: Vancomycin 12/20 >>  Aztreonam 12/20 >>  Doxycycline 12/20 >>  Dose adjustments this admission: ---  Microbiology results: 12/20 BCx: ngtd 12/20 Strep/Legionella UAg: strep positive 12/20 Flu panel: neg   Thank you for allowing pharmacy to be a part of this patient's care.  Hessie KnowsJustin M Kealohilani Maiorino, PharmD, BCPS Pager [phone] 08/20/2017  11:17 AM

## 2017-08-20 NOTE — [place] FL2 (Signed)
[place] MEDICAID FL2 LEVEL OF CARE SCREENING TOOL     IDENTIFICATION  Patient Name: [name] Birthdate: 1928-09-09 Sex: female Admission Date (Current Location): 08/17/2017  [facility] [place]   Facility and Address:  [facility],  [address], [place] [address]      Provider Number: [id number]  Attending Physician Name and Address:  Alison Murrayevine, Alma M, MD  Relative Name and Phone Number:       Current Level of Care: Hospital Recommended Level of Care: Skilled Nursing Facility Prior Approval Number:    Date Approved/Denied:   PASRR Number: [id number]  Discharge Plan: SNF    Current Diagnoses: Patient Active Problem List   Diagnosis Date Noted  . Sepsis (HCC) 08/17/2017  . Chronic combined systolic and diastolic CHF (congestive heart failure) (HCC) 08/17/2017  . Right lower lobe pneumonia (HCC) 08/17/2017  . Acute combined systolic and diastolic heart failure (HCC) 01/17/2013  . Abnormal cardiovascular stress test 01/17/2013    Class: Acute  . Tobacco use disorder   . WPW (Wolff-Parkinson-White syndrome)     Orientation RESPIRATION BLADDER Height & Weight     Self  Normal Continent Weight: 99 lb (44.9 kg) Height:  5\' 1"  (154.9 cm)  BEHAVIORAL SYMPTOMS/MOOD NEUROLOGICAL BOWEL NUTRITION STATUS      Continent Diet(see DC summary)  AMBULATORY STATUS COMMUNICATION OF NEEDS Skin   Extensive Assist Verbally Normal                       Personal Care Assistance Level of Assistance  Bathing, Dressing Bathing Assistance: Maximum assistance   Dressing Assistance: Maximum assistance     Functional Limitations Info             SPECIAL CARE FACTORS FREQUENCY  PT (By licensed PT), OT (By licensed OT)     PT Frequency: 5/wk OT Frequency: 5/wk            Contractures      Additional Factors Info  Code Status, Allergies, Psychotropic Code Status Info: DNR Allergies Info: Amoxicillin, Ciprofloxacin, Lorabid  Loracarbef, Avelox Moxifloxacin, Erythromycin, Inderal Propranolol, Chantix Varenicline Psychotropic Info: ativan, seroquel, zoloft         Current Medications (08/20/2017):  This is the current hospital active medication list Current Facility-Administered Medications  Medication Dose Route Frequency Provider Last Rate Last Dose  . acetaminophen (TYLENOL) tablet 500 mg  500 mg Oral Q6H PRN Leatha GildingGherghe, Costin M, MD      . albuterol (PROVENTIL) (2.5 MG/3ML) 0.083% nebulizer solution 2.5 mg  2.5 mg Nebulization TID PRN Leatha GildingGherghe, Costin M, MD   2.5 mg at 08/18/17 1707  . amLODipine (NORVASC) tablet 5 mg  5 mg Oral Daily Esperanza SheetsBuriev, Ulugbek N, MD   5 mg at 08/20/17 0829  . aspirin EC tablet 81 mg  81 mg Oral Daily Leatha GildingGherghe, Costin M, MD   81 mg at 08/20/17 [id number]  . aztreonam (AZACTAM) 500 mg in dextrose 5 % 50 mL IVPB  500 mg Intravenous Q8H Danford BadWofford, Drew A, RPH   Stopped at 08/20/17 0725  . cholecalciferol (VITAMIN D) tablet 1,000 Units  1,000 Units Oral Daily Leatha GildingGherghe, Costin M, MD   1,000 Units at 08/20/17 0830  . doxycycline (VIBRAMYCIN) 100 mg in dextrose 5 % 250 mL IVPB  100 mg Intravenous Q12H Leatha GildingGherghe, Costin M, MD   Stopped at 08/20/17 1035  . enoxaparin (LOVENOX) injection 30 mg  30 mg Subcutaneous Q24H Leatha GildingGherghe, Costin M, MD  30 mg at 08/19/17 2110  . fentaNYL (DURAGESIC - dosed mcg/hr) patch 25 mcg  25 mcg Transdermal Q72H Leatha GildingGherghe, Costin M, MD   25 mcg at 08/17/17 1333  . fluticasone furoate-vilanterol (BREO ELLIPTA) 200-25 MCG/INH 1 puff  1 puff Inhalation QHS Leatha GildingGherghe, Costin M, MD   1 puff at 08/19/17 1946  . guaiFENesin-dextromethorphan (ROBITUSSIN DM) 100-10 MG/5ML syrup 5 mL  5 mL Oral Q4H PRN Leatha GildingGherghe, Costin M, MD   5 mL at 08/17/17 2115  . haloperidol (HALDOL) tablet 0.5 mg  0.5 mg Oral Q6H PRN Alison Murrayevine, Alma M, MD      . hydrALAZINE (APRESOLINE) injection 10 mg  10 mg Intravenous Q8H PRN Esperanza SheetsBuriev, Ulugbek N, MD      . LORazepam (ATIVAN) injection 0.5 mg  0.5 mg Intravenous Q6H PRN Alison Murrayevine, Alma  M, MD      . LORazepam (ATIVAN) tablet 0.5 mg  0.5 mg Oral BID Leatha GildingGherghe, Costin M, MD   0.5 mg at 08/20/17 [id number]  . losartan (COZAAR) tablet 50 mg  50 mg Oral Daily Esperanza SheetsBuriev, Ulugbek N, MD   50 mg at 08/20/17 0830  . magnesium oxide (MAG-OX) tablet 200 mg  200 mg Oral Daily Leatha GildingGherghe, Costin M, MD   200 mg at 08/20/17 0831  . methocarbamol (ROBAXIN) tablet 250 mg  250 mg Oral QHS PRN Leatha GildingGherghe, Costin M, MD      . pantoprazole (PROTONIX) EC tablet 40 mg  40 mg Oral BID WC Leatha GildingGherghe, Costin M, MD   40 mg at 08/20/17 0831  . polyethylene glycol (MIRALAX / GLYCOLAX) packet 17 g  17 g Oral Daily PRN Leatha GildingGherghe, Costin M, MD      . pramipexole (MIRAPEX) tablet 0.5 mg  0.5 mg Oral Daily PRN Leatha GildingGherghe, Costin M, MD      . pramipexole (MIRAPEX) tablet 1 mg  1 mg Oral QHS Leatha GildingGherghe, Costin M, MD   1 mg at 08/19/17 2110  . QUEtiapine (SEROQUEL) tablet 100 mg  100 mg Oral Once per day on Sun Tue Thu Sat Leatha GildingGherghe, Costin M, MD   100 mg at 08/19/17 2109  . sertraline (ZOLOFT) tablet 25 mg  25 mg Oral QHS Leatha GildingGherghe, Costin M, MD   25 mg at 08/19/17 2109  . vancomycin (VANCOCIN) IVPB 750 mg/150 ml premix  750 mg Intravenous Q48H Danford BadWofford, Drew A, RPH   Stopped at 08/18/17 2359  . vitamin B-12 (CYANOCOBALAMIN) tablet 100 mcg  100 mcg Oral Daily Leatha GildingGherghe, Costin M, MD   100 mcg at 08/20/17 [id number]     Discharge Medications: Please see discharge summary for a list of discharge medications.  Relevant Imaging Results:  Relevant Lab Results:   Additional Information SS#: [id number]  Burna SisUris, Kitty Cadavid H, LCSW

## 2017-08-21 LAB — LEGIONELLA PNEUMOPHILA SEROGP 1 UR AG: L. PNEUMOPHILA SEROGP 1 UR AG: NEGATIVE

## 2017-08-21 LAB — BASIC METABOLIC PANEL
ANION GAP: 7 (ref 5–15)
BUN: 10 mg/dL (ref 6–20)
CHLORIDE: 107 mmol/L (ref 101–111)
CO2: 26 mmol/L (ref 22–32)
Calcium: 8.4 mg/dL — ABNORMAL LOW (ref 8.9–10.3)
Creatinine, Ser: 0.63 mg/dL (ref 0.44–1.00)
GFR calc Af Amer: >60 mL/min (ref >60)
GLUCOSE: 86 mg/dL (ref 65–99)
POTASSIUM: 3.3 mmol/L — AB (ref 3.5–5.1)
Sodium: 140 mmol/L (ref 135–145)

## 2017-08-21 LAB — CBC
HEMATOCRIT: 35.9 % — AB (ref 36.0–46.0)
HEMOGLOBIN: 11.5 g/dL — AB (ref 12.0–15.0)
MCH: 29.6 pg (ref 26.0–34.0)
MCHC: 32 g/dL (ref 30.0–36.0)
MCV: 92.3 fL (ref 78.0–100.0)
Platelets: 286 10*3/uL (ref 150–400)
RBC: 3.89 MIL/uL (ref 3.87–5.11)
RDW: 13.2 % (ref 11.5–15.5)
WBC: 4.7 10*3/uL (ref 4.0–10.5)

## 2017-08-21 MED ORDER — SERTRALINE HCL 25 MG PO TABS: 25.0000 mg | ORAL_TABLET | Freq: Every day | ORAL | 0 refills | Status: AC

## 2017-08-21 MED ORDER — FENTANYL 25 MCG/HR TD PT72: 25.0000 ug | MEDICATED_PATCH | TRANSDERMAL | 0 refills | Status: DC

## 2017-08-21 MED ORDER — POTASSIUM CHLORIDE CRYS ER 20 MEQ PO TBCR
40.0000 meq | EXTENDED_RELEASE_TABLET | Freq: Once | ORAL | Status: AC
  Administered 2017-08-21: 40 meq via ORAL
  Filled 2017-08-21: qty 2

## 2017-08-21 MED ORDER — QUETIAPINE FUMARATE 100 MG PO TABS: 75.0000 mg | ORAL_TABLET | Freq: Every day | ORAL | 0 refills | Status: AC

## 2017-08-21 MED ORDER — LORAZEPAM 1 MG PO TABS: 0.5000 mg | ORAL_TABLET | Freq: Two times a day (BID) | ORAL | 0 refills | Status: DC

## 2017-08-21 MED ORDER — DOXYCYCLINE HYCLATE 100 MG PO TABS
100.0000 mg | ORAL_TABLET | Freq: Two times a day (BID) | ORAL | Status: DC
  Administered 2017-08-21 – 2017-08-23 (×5): 100 mg via ORAL
  Filled 2017-08-21 (×5): qty 1

## 2017-08-21 MED ORDER — AZTREONAM IN DEXTROSE 1 GM/50ML IV SOLN
1.0000 g | Freq: Three times a day (TID) | INTRAVENOUS | Status: DC
  Filled 2017-08-21: qty 50

## 2017-08-21 MED ORDER — HALOPERIDOL 0.5 MG PO TABS: 0.5000 mg | ORAL_TABLET | Freq: Four times a day (QID) | ORAL | 0 refills | Status: DC | PRN

## 2017-08-21 NOTE — Progress Notes (Signed)
Pharmacy Antibiotic Note  [name] is a 83 y.o. female admitted on 08/17/2017 with sepsis d/t RLL pneumonia.  Pharmacy has been consulted for vancomycin and aztreonam dosing. Patient found to have strep pneumo PNA and not multiple allergies  Plan: Day 5 Antibiotics  Abx de-escalated to Doxycycline 100mg  po bid per MD for CAP treatment  No dose adjustment needed for renal function  Levaquin ordered initially, but anaphylaxis to Cipro, requested remainder of treatment with Doxycycline  Height: 5\' 1"  (154.9 cm) Weight: 99 lb (44.9 kg) IBW/kg (Calculated) : 47.8  Temp (24hrs), Avg:99.5 F (37.5 C), Min:99 F (37.2 C), Max:99.8 F (37.7 C)  Recent Labs  Lab 08/17/17 0915 08/17/17 0925 08/17/17 1122 08/18/17 0633 08/19/17 0324 08/20/17 0504 08/20/17 1136 08/20/17 1502 08/21/17 0411  WBC 11.3*  --   --  6.3 5.1  --   --   --  4.7  CREATININE 1.03*  --   --  0.64 0.67 0.67  --   --  0.63  LATICACIDVEN  --  2.19* 1.36  --   --   --  0.6 1.1  --     Estimated Creatinine Clearance: 35.8 mL/min (by C-G formula based on SCr of 0.63 mg/dL).    Allergies  Allergen Reactions  . Amoxicillin Anaphylaxis and Rash  . Ciprofloxacin Anaphylaxis and Other (See Comments)    Thrush  . Lorabid [Loracarbef] Anaphylaxis and Rash  . Avelox [Moxifloxacin] Other (See Comments)    Tendon and joint pain  . Erythromycin Other (See Comments)    angioedema  . Inderal [Propranolol] Other (See Comments)    Vasculitis  . Chantix [Varenicline] Nausea Only   Antimicrobials this admission: Vancomycin 12/20 >> 12/24 Aztreonam 12/20 >> 12/24 Doxycycline 12/20 >> to po 12/24   Dose adjustments this admission:  Microbiology results: 12/20 BCx: ngtd 12/20 Strep/Legionella UAg: strep positive 12/20 Flu panel: neg  Thank you for allowing pharmacy to be a part of this patient's care.  Otho BellowsGreen, Aleira Deiter L PharmD Pager [phone] 08/21/2017, 7:10 AM

## 2017-08-21 NOTE — Progress Notes (Addendum)
Patient ID: [name], female   DOB: 12/04/1928, 83 y.o.   MRN: [id number]  PROGRESS NOTE    Deborah MoronBetty B [name]  XLK:[id number] DOB: 12/04/1928 DOA: 08/17/2017  PCP: Marden NobleGates, Robert, MD   Brief Narrative:  83 y.o.femalewith medical history significant ofprobable dementia, chronic combined systolic and diastolic CHF, COPD, hypertension, prior tobacco abuse, is being brought to the hospital by the daughter for increased confusion and hallucinations at home.She was septic in the emergency room, and chest x-ray showed right lower lobe pneumonia. She was started on broad-spectrum antibiotics and admitted to stepdown. Strep pneumo antigen returned positive.   Assessment & Plan:   Sepsis due to right lower lobe pneumonia, pneumococcal pneumonia - Patient presented to the emergency room with sepsis, meeting criteria with high fever, tachycardia, and a source of the chest x-ray. She was influenza negative, strep pneumo positive - We will stop vanco, aztreonam - Continue doxycycline for 3 more days   Chronic combined systolic and diastolic CHF - She was to see Dr. Katrinka BlazingSmith with cardiology in the past, most recent cardiology evaluation that I see was back in 2014, and it was mentioned that her echo at that time had an EF of about 45-50%. 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 Graciela HusbandsKlein  COPD - Stable resp status   Tobacco abuse, in remission - Quit 2 years ago  Dementiawith behavioral disturbances - Stable mental status    DVT prophylaxis: Lovenox subQ Code Status: DNR/DNI  Family Communication: no family at the bedside  Disposition Plan: d/c in am  Consultants:   SLP  Procedures:   None   Antimicrobials:   Vanco 12/20 --> 12/24  Doxycycline 12/20 -->   Azithromycin 12/20 --> 12/24   Subjective: No overnight events.  Objective: Vitals:   08/20/17 1300 08/20/17 1936 08/20/17  2009 08/21/17 0531  BP: 122/65  126/63 134/72  Pulse: (!) 110  99 96  Resp: 16  18 18   Temp: 99 F (37.2 C)  99.8 F (37.7 C) 99.6 F (37.6 C)  TempSrc: Oral  Oral Oral  SpO2: 94% 92% 91% 92%  Weight:      Height:        Intake/Output Summary (Last 24 hours) at 08/21/2017 0657 Last data filed at 08/20/2017 2300 Gross per 24 hour  Intake 1180 ml  Output -  Net 1180 ml   Filed Weights   08/17/17 0847  Weight: 44.9 kg (99 lb)    Physical Exam  Constitutional: Appears well-developed and well-nourished. No distress.   CVS: RRR, S1/S2 + Pulmonary: Effort and breath sounds normal, no stridor, rhonchi, wheezes, rales.  Abdominal: Soft. BS +,  no distension, tenderness, rebound or guarding.  Musculoskeletal: Normal range of motion. No edema and no tenderness.  Neuro: Alert. No cranial nerve deficit. Skin: Skin is warm and dry. No rash noted. Not diaphoretic. No erythema. No pallor.  Psychiatric: Behavior, judgment, thought content normal.   Data Reviewed: I have personally reviewed following labs and imaging studies  CBC: Recent Labs  Lab 08/17/17 0915 08/18/17 0633 08/19/17 0324 08/21/17 0411  WBC 11.3* 6.3 5.1 4.7  HGB 11.9* 9.6* 10.5* 11.5*  HCT 36.7 29.7* 33.5* 35.9*  MCV 94.6 92.8 94.6 92.3  PLT 230 172 232 286   Basic Metabolic Panel: Recent Labs  Lab 08/17/17 0915 08/18/17 0633 08/19/17 0324 08/20/17 0504 08/21/17 0411  NA 140 140  140  --  140  K 3.7 3.3* 3.9  --  3.3*  CL 105 111 111  --  107  CO2 26 24 24   --  26  GLUCOSE 116* 98 78  --  86  BUN 25* 14 11  --  10  CREATININE 1.03* 0.64 0.67 0.67 0.63  CALCIUM 8.8* 7.9* 8.2*  --  8.4*   GFR: Estimated Creatinine Clearance: 35.8 mL/min (by C-G formula based on SCr of 0.63 mg/dL). Liver Function Tests: Recent Labs  Lab 08/17/17 0915 08/18/17 0633  AST 32 25  ALT 19 19  ALKPHOS 58 47  BILITOT 0.9 0.6  PROT 7.0 5.7*  ALBUMIN 3.5 2.7*   No results for input(s): LIPASE, AMYLASE in the last  168 hours. No results for input(s): AMMONIA in the last 168 hours. Coagulation Profile: Recent Labs  Lab 08/17/17 0915  INR 0.95   Cardiac Enzymes: No results for input(s): CKTOTAL, CKMB, CKMBINDEX, TROPONINI in the last 168 hours. BNP (last 3 results) No results for input(s): PROBNP in the last 8760 hours. HbA1C: No results for input(s): HGBA1C in the last 72 hours. CBG: Recent Labs  Lab 08/17/17 0939  GLUCAP 95   Lipid Profile: No results for input(s): CHOL, HDL, LDLCALC, TRIG, CHOLHDL, LDLDIRECT in the last 72 hours. Thyroid Function Tests: No results for input(s): TSH, T4TOTAL, FREET4, T3FREE, THYROIDAB in the last 72 hours. Anemia Panel: No results for input(s): VITAMINB12, FOLATE, FERRITIN, TIBC, IRON, RETICCTPCT in the last 72 hours. Urine analysis:    Component Value Date/Time   COLORURINE YELLOW 08/17/2017 1237   APPEARANCEUR HAZY (A) 08/17/2017 1237   LABSPEC 1.023 08/17/2017 1237   PHURINE 5.0 08/17/2017 1237   GLUCOSEU NEGATIVE 08/17/2017 1237   HGBUR NEGATIVE 08/17/2017 1237   BILIRUBINUR NEGATIVE 08/17/2017 1237   KETONESUR NEGATIVE 08/17/2017 1237   PROTEINUR 30 (A) 08/17/2017 1237   UROBILINOGEN 0.2 04/25/2014 1827   NITRITE NEGATIVE 08/17/2017 1237   LEUKOCYTESUR NEGATIVE 08/17/2017 1237   Sepsis Labs: @LABRCNTIP (procalcitonin:4,lacticidven:4)   Blood Culture (routine x 2)     Status: None (Preliminary result)   Collection Time: 08/17/17  9:15 AM  Result Value Ref Range Status   Specimen Description BLOOD RIGHT ANTECUBITAL  Final   Special Requests   Final    BOTTLES DRAWN AEROBIC AND ANAEROBIC Blood Culture adequate volume   Culture   Final    NO GROWTH 2 DAYS Performed at [facility], [address] [place], [place] [address]    Report Status PENDING  Incomplete  Blood Culture (routine x 2)     Status: None (Preliminary result)   Collection Time: 08/17/17  9:15 AM  Result Value Ref Range Status   Specimen Description BLOOD LEFT  ANTECUBITAL  Final   Special Requests   Final    BOTTLES DRAWN AEROBIC AND ANAEROBIC Blood Culture adequate volume   Culture   Final    NO GROWTH 2 DAYS Performed at [facility], [address] [place], [place] [address]    Report Status PENDING  Incomplete  MRSA PCR Screening     Status: None   Collection Time: 08/17/17 12:25 PM  Result Value Ref Range Status   MRSA by PCR NEGATIVE NEGATIVE Final      Radiology Studies: Dg Chest 2 View  Result Date: 08/17/2017 CLINICAL DATA:  Sepsis, cough, COPD EXAM: CHEST  2 VIEW COMPARISON:  04/17/2014 FINDINGS: The lungs are hyperinflated likely secondary to COPD. There is right lower  lobe hazy airspace disease concerning for pneumonia. There is no pleural effusion or pneumothorax. There is mild stable cardiomegaly. There is thoracic aortic atherosclerosis. The osseous structures are unremarkable. There is a spinal stimulator with the metallic portion projecting over the midthoracic spine. IMPRESSION: 1. Right lower lobe hazy airspace disease concerning for pneumonia. 2. COPD. Electronically Signed   By: Elige Ko   On: 08/17/2017 09:37   Ct Head Wo Contrast  Result Date: 08/17/2017 CLINICAL DATA:  Altered level of consciousness. EXAM: CT HEAD WITHOUT CONTRAST TECHNIQUE: Contiguous axial images were obtained from the base of the skull through the vertex without intravenous contrast. COMPARISON:  CT scan of March 04, 2015. FINDINGS: Brain: Mild diffuse cortical atrophy is noted. Mild chronic ischemic white matter disease is noted. No mass effect or midline shift is noted. Ventricular size is within normal limits. There is no evidence of mass lesion, hemorrhage or acute infarction. Vascular: No hyperdense vessel or unexpected calcification. Skull: Normal. Negative for fracture or focal lesion. Sinuses/Orbits: Bilateral maxillary, sphenoid and ethmoid sinusitis is noted. Other: None. IMPRESSION: Mild diffuse cortical atrophy. Mild chronic ischemic  white matter disease. Bilateral ethmoid, sphenoid and maxillary sinusitis. No acute intracranial abnormality seen. Electronically Signed   By: Lupita Raider, M.D.   On: 08/17/2017 10:55        Scheduled Meds: . amLODipine  5 mg Oral Daily  . aspirin EC  81 mg Oral Daily  . cholecalciferol  1,000 Units Oral Daily  . enoxaparin (LOVENOX) injection  30 mg Subcutaneous Q24H  . fentaNYL  25 mcg Transdermal Q72H  . fluticasone furoate-vilanterol  1 puff Inhalation QHS  . LORazepam  0.5 mg Oral BID  . losartan  50 mg Oral Daily  . magnesium oxide  200 mg Oral Daily  . pantoprazole  40 mg Oral BID WC  . pramipexole  1 mg Oral QHS  . QUEtiapine  100 mg Oral Once per day on Sun Tue Thu Sat  . sertraline  25 mg Oral QHS  . vitamin B-12  100 mcg Oral Daily   Continuous Infusions:    LOS: 4 days    Time spent: 25 minutes  Greater than 50% of the time spent on counseling and coordinating the care.   [name], MD [facility] Pager [phone]  If 7PM-7AM, please contact night-coverage [email or web address] Password [facility] 08/21/2017, 6:57 AM

## 2017-08-21 NOTE — Progress Notes (Signed)
Physical Therapy Treatment Patient Details Name: [name] MRN: [id number] DOB: [birth year] Today's Date: 08/21/2017    History of Present Illness 83 y.o. female with medical history significant of probable dementia, chronic combined systolic and diastolic CHF, COPD, hypertension, prior tobacco abuse, is being brought to the hospital by the daughter for increased confusion and hallucinations at home.  She was septic in the emergency room 08/17/17, and chest x-ray showed right lower lobe pneumonia    PT Comments    Pt tolerated increased activity level. She ambulated 430' with hand held assist. Ambulation distance limited by PT due to HR 135 with walking and SaO2 dropped to 80% on room air, SaO2 88% on room air after 2 minutes seated rest. RN notified of vital signs with ambulation.    Follow Up Recommendations  SNF     Equipment Recommendations  None recommended by PT    Recommendations for Other Services       Precautions / Restrictions Precautions Precautions: Fall Restrictions Weight Bearing Restrictions: No    Mobility  Bed Mobility Overal bed mobility: Needs Assistance Bed Mobility: Supine to Sit     Supine to sit: Min assist     General bed mobility comments: min A to raise trunk  Transfers Overall transfer level: Needs assistance Equipment used: 1 person hand held assist Transfers: Sit to/from Stand Sit to Stand: Min assist         General transfer comment: min A to rise/steady  Ambulation/Gait Ambulation/Gait assistance: Min assist Ambulation Distance (Feet): 30 Feet Assistive device: 1 person hand held assist Gait Pattern/deviations: Decreased step length - right;Decreased step length - left;Shuffle     General Gait Details: distance limited by PT due to HR 135 walking and SaO2 80% on room air walking, came up to 88% on room air after 2 minutes seated rest, RN notified of need for O2 (no nasal canula in room)   Stairs             Wheelchair Mobility    Modified Rankin (Stroke Patients Only)       Balance Overall balance assessment: History of Falls;Needs assistance Sitting-balance support: Feet supported;Bilateral upper extremity supported Sitting balance-Leahy Scale: Fair     Standing balance support: During functional activity;Bilateral upper extremity supported Standing balance-Leahy Scale: Poor                              Cognition Arousal/Alertness: Awake/alert Behavior During Therapy: WFL for tasks assessed/performed Overall Cognitive Status: No family/caregiver present to determine baseline cognitive functioning Area of Impairment: Orientation;Attention;Safety/judgement;Following commands;Awareness                       Following Commands: Follows one step commands inconsistently       General Comments: pt not able to state her birthdate, not oriented to location/month/situation, follows 1 step commands inconsistently      Exercises      General Comments        Pertinent Vitals/Pain Pain Assessment: No/denies pain    Home Living                      Prior Function            PT Goals (current goals can now be found in the care plan section) Acute Rehab PT Goals Patient Stated Goal: per daughter, to go to a facility where she will be safe.  PT Goal Formulation: Patient unable to participate in goal setting Time For Goal Achievement: 09/01/17 Potential to Achieve Goals: Fair Progress towards PT goals: Progressing toward goals    Frequency    Min 2X/week      PT Plan Current plan remains appropriate    Co-evaluation              AM-PAC PT "6 Clicks" Daily Activity  Outcome Measure  Difficulty turning over in bed (including adjusting bedclothes, sheets and blankets)?: Unable Difficulty moving from lying on back to sitting on the side of the bed? : Unable Difficulty sitting down on and standing up from a chair with arms (e.g.,  wheelchair, bedside commode, etc,.)?: Unable Help needed moving to and from a bed to chair (including a wheelchair)?: A Little Help needed walking in hospital room?: A Little Help needed climbing 3-5 steps with a railing? : Total 6 Click Score: 10    End of Session Equipment Utilized During Treatment: Gait belt Activity Tolerance: Treatment limited secondary to medical complications (Comment)(elevated HR and hypoxia with walking) Patient left: in chair;with call bell/phone within reach;with restraints reapplied;Other (comment)(video monitor) Nurse Communication: Mobility status PT Visit Diagnosis: Difficulty in walking, not elsewhere classified (R26.2)     Time: [id number] PT Time Calculation (min) (ACUTE ONLY): 15 min  Charges:  $Therapeutic Activity: 8-22 mins                    G Codes:         Tamala SerUhlenberg, Earnie Bechard Kistler 08/21/2017, 10:58 AM [phone]

## 2017-08-22 LAB — CBC
HEMATOCRIT: 35.6 % — AB (ref 36.0–46.0)
HEMOGLOBIN: 11.5 g/dL — AB (ref 12.0–15.0)
MCH: 29.9 pg (ref 26.0–34.0)
MCHC: 32.3 g/dL (ref 30.0–36.0)
MCV: 92.7 fL (ref 78.0–100.0)
Platelets: 297 10*3/uL (ref 150–400)
RBC: 3.84 MIL/uL — ABNORMAL LOW (ref 3.87–5.11)
RDW: 13.3 % (ref 11.5–15.5)
WBC: 6.4 10*3/uL (ref 4.0–10.5)

## 2017-08-22 LAB — BASIC METABOLIC PANEL
Anion gap: 8 (ref 5–15)
BUN: 14 mg/dL (ref 6–20)
CHLORIDE: 104 mmol/L (ref 101–111)
CO2: 26 mmol/L (ref 22–32)
CREATININE: 0.77 mg/dL (ref 0.44–1.00)
Calcium: 8.5 mg/dL — ABNORMAL LOW (ref 8.9–10.3)
GFR calc non Af Amer: >60 mL/min (ref >60)
GLUCOSE: 91 mg/dL (ref 65–99)
Potassium: 3.6 mmol/L (ref 3.5–5.1)
Sodium: 138 mmol/L (ref 135–145)

## 2017-08-22 LAB — CULTURE, BLOOD (ROUTINE X 2)
Culture: NO GROWTH
Culture: NO GROWTH
SPECIAL REQUESTS: ADEQUATE
Special Requests: ADEQUATE

## 2017-08-22 MED ORDER — CYANOCOBALAMIN 100 MCG PO TABS: 100.0000 ug | ORAL_TABLET | Freq: Every day | ORAL | 0 refills | Status: DC

## 2017-08-22 MED ORDER — DOXYCYCLINE HYCLATE 100 MG PO TABS: 100.0000 mg | ORAL_TABLET | Freq: Two times a day (BID) | ORAL | 0 refills | Status: AC

## 2017-08-22 NOTE — Progress Notes (Signed)
CSW contacted by patient's daughter and informed that they spoke with patient's RN who informed them that patient had not been out of restraints for 24 hours and that was a barrier for dc to SNF. Patient's daughter reported that plan is for patient to stay overnight and admit to [facility] tomorrow.   CSW contacted [facility] admissions staff and provided update about patient's plan to dc to SNF tomorrow.   CSW will continue to follow and assist with dc planning.  Celso SickleKimberly Miki Blank, [place] Clinical Social Worker [facility] Cell#: [phone]

## 2017-08-22 NOTE — Clinical Social Work Placement (Signed)
   CLINICAL SOCIAL WORK PLACEMENT  NOTE  Date:  08/22/2017  Patient Details  Name: [name] MRN: [id number] Date of Birth: 1929/03/28  Clinical Social Work is seeking post-discharge placement for this patient at the Skilled  Nursing Facility level of care (*CSW will initial, date and re-position this form in  chart as items are completed):  Yes   Patient/family provided with [place] Clinical Social Work Department's list of facilities offering this level of care within the geographic area requested by the patient (or if unable, by the patient's family).  Yes   Patient/family informed of their freedom to choose among providers that offer the needed level of care, that participate in Medicare, Medicaid or managed care program needed by the patient, have an available bed and are willing to accept the patient.  Yes   Patient/family informed of 's ownership interest in [facility] and [facility], as well as of the fact that they are under no obligation to receive care at these facilities.  PASRR submitted to EDS on 08/20/17     PASRR number received on 08/20/17     Existing PASRR number confirmed on       FL2 transmitted to all facilities in geographic area requested by pt/family on       FL2 transmitted to all facilities within larger geographic area on       Patient informed that his/her managed care company has contracts with or will negotiate with certain facilities, including the following:        Yes   Patient/family informed of bed offers received.  Patient chooses bed at       Physician recommends and patient chooses bed at      Patient to be transferred to   on  .  Patient to be transferred to facility by       Patient family notified on   of transfer.  Name of family member notified:        PHYSICIAN       Additional Comment:    _______________________________________________ Antionette PolesKimberly L Rayla Pember, LCSW 08/22/2017, 1:07 PM

## 2017-08-22 NOTE — Discharge Instructions (Signed)
Doxycycline delayed-release capsules What is this medicine? DOXYCYCLINE (dox i SYE kleen) is a tetracycline antibiotic. It is used to treat certain kinds of bacterial infections, Lyme disease, and malaria. It will not work for colds, flu, or other viral infections. This medicine may be used for other purposes; ask your health care provider or pharmacist if you have questions. COMMON BRAND NAME(S): Oracea What should I tell my health care provider before I take this medicine? They need to know if you have any of these conditions: -bowel disease like colitis -liver disease -long exposure to sunlight like working outdoors -an unusual or allergic reaction to doxycycline, tetracycline antibiotics, other medicines, foods, dyes, or preservatives -pregnant or trying to get pregnant -breast-feeding How should I use this medicine? Take this medicine by mouth with a full glass of water. Follow the directions on the prescription label. Do not crush or chew. The capsules may be opened and the pellets sprinkled on applesauce. Swallow the pellets whole without chewing. Follow with an 8 ounce glass of water to help you swallow all the pellets. Do not prepare a dose and store for later use. The applesauce mixture should be taken immediately after you prepare it. It is best to take this medicine without other food, but if it upsets your stomach take it with food. Take your medicine at regular intervals. Do not take your medicine more often than directed. Take all of your medicine as directed even if you think your are better. Do not skip doses or stop your medicine early. Talk to your pediatrician regarding the use of this medicine in children. While this drug may be prescribed for selected conditions, precautions do apply. Overdosage: If you think you have taken too much of this medicine contact a poison control center or emergency room at once. NOTE: This medicine is only for you. Do not share this medicine with  others. What if I miss a dose? If you miss a dose, take it as soon as you can. If it is almost time for your next dose, take only that dose. Do not take double or extra doses. What may interact with this medicine? -antacids -barbiturates -birth control pills -bismuth subsalicylate -carbamazepine -methoxyflurane -other antibiotics -phenytoin -vitamins that contain iron -warfarin This list may not describe all possible interactions. Give your health care provider a list of all the medicines, herbs, non-prescription drugs, or dietary supplements you use. Also tell them if you smoke, drink alcohol, or use illegal drugs. Some items may interact with your medicine. What should I watch for while using this medicine? Tell your doctor or health care professional if your symptoms do not improve. Do not treat diarrhea with over the counter products. Contact your doctor if you have diarrhea that lasts more than 2 days or if it is severe and watery. Do not take this medicine just before going to bed. It may not dissolve properly when you lay down and can cause pain in your throat. Drink plenty of fluids while taking this medicine to also help reduce irritation in your throat. This medicine can make you more sensitive to the sun. Keep out of the sun. If you cannot avoid being in the sun, wear protective clothing and use sunscreen. Do not use sun lamps or tanning beds/booths. If you are being treated for a sexually transmitted infection, avoid sexual contact until you have finished your treatment. Your sexual partner may also need treatment. Avoid antacids, aluminum, calcium, magnesium, and iron products for 4 hours before and   2 hours after taking a dose of this medicine. Birth control pills may not work properly while you are taking this medicine. Talk to your doctor about using an extra method of birth control. If you are using this medicine to prevent malaria, you should still protect yourself from  contact with mosquitos. Stay in screened-in areas, use mosquito nets, keep your body covered, and use an insect repellent. What side effects may I notice from receiving this medicine? Side effects that you should report to your doctor or health care professional as soon as possible: -allergic reactions like skin rash, itching or hives, swelling of the face, lips, or tongue -difficulty breathing -fever -itching in the rectal or genital area -pain on swallowing -redness, blistering, peeling or loosening of the skin, including inside the mouth -severe stomach pain or cramps -unusual bleeding or bruising -unusually weak or tired -yellowing of the eyes or skin Side effects that usually do not require medical attention (report to your doctor or health care professional if they continue or are bothersome): -diarrhea -loss of appetite -nausea, vomiting This list may not describe all possible side effects. Call your doctor for medical advice about side effects. You may report side effects to FDA at 1-800-FDA-1088. Where should I keep my medicine? Keep out of the reach of children. Store at room temperature, below 25 degrees C (77 degrees F). Protect from light. Keep container tightly closed. Throw away any unused medicine after the expiration date. Taking this medicine after the expiration date can make you seriously ill. NOTE: This sheet is a summary. It may not cover all possible information. If you have questions about this medicine, talk to your doctor, pharmacist, or health care provider.  2018 Elsevier/Gold Standard (2015-09-17 10:41:39)  

## 2017-08-22 NOTE — Progress Notes (Signed)
CSW provided bed offers to patient's daughter. Patient's daughter reported that her first preference is [facility] and that her second choice is [facility]. Patient's daughter expressed concern about patient being charged for staying over night if patient is unable to dc to SNF today, CSW validated patient's daughter's concerns and agreed to follow up with selected SNFs and provide update.   CSW contacted [facility] and spoke with staff member Cristal DeerChristopher. Staff reported that there is no admissions staff present and that he is unaware if they are accepting new admissions.   CSW contacted [facility] Admissions staff member Lowella Bandyikki, no answer.  CSW contacted [facility] main line, staff member Jacinto HalimVanetta reported that CSW would need to contact admissions staff.  CSW contacted [facility] admissions staff [phone]),no answer. CSW left voicemail requesting return phone call.   CSW staffed patient's case with CSW Director. CSW advised to see if any of the SNFs that provided bed offers were able to accept patient today. CSW Director informed CSW that if patient is not able to dc to SNF today that family has the option to take patient home and admit to SNF from home. CSW Director reported that patient would be charged for staying in the hospital after being discharged.   CSW contacted [facility], staff member Darel HongJudy reported that they are able to accept patient today.  CSW contacted [facility], staff member Clydie BraunKaren reported that they may be able to accept patient contingent upon having what patient needs available.   CSW contacted by [facility] admissions staff [place] and informed that they are not accepting patient's at this time because their pharmacy is closed.  CSW provided update to patient's daughter. Patient's daughter expressed frustration about not being able to go to desired SNF due to holiday, CSW provided  active listening. CSW provided available options to patient's daughter, that patient can dc to an available SNF or dc home and admit to SNF from home. Patient's daughter reported that she would prefer that patient stay at hospital until tomorrow and go to desired SNF but is unsure about how much patient will be charged for staying in the hospital after being discharged. Patient's daughter reported that she wanted to call [facility] directly, CSW provided number to [facility]. CSW informed patient's daughter that CSW is unaware how [facility] is accepting admissions and if they wait too late patient may not be able to dc to [facility] today, patient's daughter verbalized understanding. Patient's daughter reported that she wanted to talk to patient's doctor. CSW provided patient's daughter with contact information for [facility] to speak with patient's RN to see if patient's attending is available. Patient's daughter agreed to contact CSW with an update.   CSW awaiting return call from patient's daughter, for update about patient's discharge plans.   Celso SickleKimberly Domenik Trice, [place] Clinical Social Worker [facility] Cell#: [phone]

## 2017-08-22 NOTE — Discharge Summary (Addendum)
Physician Discharge Summary  [name]:[id number] DOB: [birth year] DOA: 08/17/2017  PCP: [name], MD  Admit date: 08/17/2017 Discharge date: 08/23/2017  Recommendations for Outpatient Follow-up:  1. Check CBC and BMP per SNF protocol 2. Continue doxycycline for 7 days on discharge  3. Continue PRN oxygen 1 L via [place] for oxygen saturation 87% or below  Discharge Diagnoses:  Active Problems:   Tobacco use disorder   WPW (Wolff-Parkinson-White syndrome)   Sepsis (HCC)   Chronic combined systolic and diastolic CHF (congestive heart failure) (HCC)   Right lower lobe pneumonia (HCC)    Discharge Condition: stable   Diet recommendation: as tolerated   History of present illness:  83 y.o.femalewith medical history significant ofprobable dementia, chronic combined systolic and diastolic CHF, COPD, hypertension, prior tobacco abuse, is being brought to the hospital by the daughter for increased confusion and hallucinations at home.She was septic in the emergency room, and chest x-ray showed right lower lobe pneumonia. She was started on broad-spectrum antibiotics and admitted to stepdown. Strep pneumo antigen returned positive.  Hospital Course:   Assessment & Plan:   Sepsis due to right lower lobe pneumonia, pneumococcal pneumonia - Patient presented to the emergency room with sepsis, meeting criteria with high fever, tachycardia, and a source of the chest x-ray.She was influenza negative, strep pneumo positive - Continue doxycyline for 7 days on discharge   Chronic combined systolic and diastolic CHF - She was to see Dr. [name] with cardiology in the past, most recent cardiology evaluation that I see was back in 2014, and it was mentioned that her echo at that time had an EF of about 45-50%. 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 [name]  COPD - Stable   Tobacco abuse, in remission - Quit 2 years  ago  Dementiawith behavioral disturbances More alert this am   DVT prophylaxis: Lovenox subQ Code Status: DNR/DNI  Family Communication: no family at the bedside    Consultants:   SLP  Procedures:   None   Antimicrobials:   Vanco 12/20 --> 12/24  Doxycycline 12/20 --> for 7 days on discharge   Azithromycin 12/20 --> 12/24    Signed:  [name], MD  [facility] 08/22/2017, 11:56 AM  Pager #: [phone]  Time spent in minutes: more than 30 minutes   Discharge Exam: Vitals:   08/21/17 2010 08/22/17 0540  BP: (!) 135/115 134/75  Pulse: (!) 108 97  Resp: 19 16  Temp: (!) 100.8 F (38.2 C) 98.1 F (36.7 C)  SpO2: 93% 94%   Vitals:   08/21/17 1346 08/21/17 1930 08/21/17 2010 08/22/17 0540  BP: 102/73  (!) 135/115 134/75  Pulse: (!) 114  (!) 108 97  Resp: 18  19 16   Temp: 98.5 F (36.9 C)  (!) 100.8 F (38.2 C) 98.1 F (36.7 C)  TempSrc: Oral  Oral Oral  SpO2: 91% 90% 93% 94%  Weight:      Height:        General: Pt is alert, follows commands appropriately, not in acute distress Cardiovascular: Regular rate and rhythm, S1/S2 + Respiratory: Clear to auscultation bilaterally, no wheezing, no crackles, no rhonchi Abdominal: Soft, non tender, non distended, bowel sounds +, no guarding Extremities: no edema, no cyanosis, pulses palpable bilaterally DP and PT Neuro: Grossly nonfocal  Discharge Instructions  Discharge Instructions    Call MD for:  persistant nausea and vomiting   Complete by:  As  directed    Call MD for:  redness, tenderness, or signs of infection (pain, swelling, redness, odor or green/yellow discharge around incision site)   Complete by:  As directed    Call MD for:  severe uncontrolled pain   Complete by:  As directed    Diet - low sodium heart healthy   Complete by:  As directed    Discharge instructions   Complete by:  As directed    Continue Doxycycline for 7 days on discharge   Increase activity slowly    Complete by:  As directed      Allergies as of 08/22/2017      Reactions   Amoxicillin Anaphylaxis, Rash   Ciprofloxacin Anaphylaxis, Other (See Comments)   Thrush   Lorabid [loracarbef] Anaphylaxis, Rash   Avelox [moxifloxacin] Other (See Comments)   Tendon and joint pain   Erythromycin Other (See Comments)   angioedema   Inderal [propranolol] Other (See Comments)   Vasculitis   Chantix [varenicline] Nausea Only      Medication List    TAKE these medications   acetaminophen 500 MG tablet Commonly known as:  TYLENOL Take 500 mg by mouth every 6 (six) hours as needed for moderate pain.   albuterol (2.5 MG/3ML) 0.083% nebulizer solution Commonly known as:  PROVENTIL Take 2.5 mg by nebulization 3 (three) times daily as needed for wheezing or shortness of breath.   aspirin 81 MG EC tablet Take 1 tablet (81 mg total) by mouth daily.   B Complex-B12 Tabs Take 1 tablet by mouth daily.   BREO ELLIPTA 200-25 MCG/INH Aepb Generic drug:  fluticasone furoate-vilanterol Inhale 1 puff into the lungs at bedtime.   cyanocobalamin 100 MCG tablet Take 1 tablet (100 mcg total) by mouth daily. Start taking on:  08/23/2017   doxycycline 100 MG tablet Commonly known as:  VIBRA-TABS Take 1 tablet (100 mg total) by mouth every 12 (twelve) hours for 7 days.   fentaNYL 25 MCG/HR patch Commonly known as:  DURAGESIC - dosed mcg/hr Place 1 patch (25 mcg total) onto the skin every 3 (three) days.   haloperidol 0.5 MG tablet Commonly known as:  HALDOL Take 1 tablet (0.5 mg total) by mouth every 6 (six) hours as needed for agitation.   LORazepam 1 MG tablet Commonly known as:  ATIVAN Take 0.5 tablets (0.5 mg total) by mouth 2 (two) times daily.   losartan 50 MG tablet Commonly known as:  COZAAR Take 50 mg by mouth daily.   Magnesium 250 MG Tabs Take 1 tablet by mouth daily.   methocarbamol 500 MG tablet Commonly known as:  ROBAXIN Take 250 mg by mouth at bedtime as needed for  spasms.   nitroGLYCERIN 0.4 MG SL tablet Commonly known as:  NITROSTAT Place 1 tablet (0.4 mg total) under the tongue every 5 (five) minutes x 3 doses as needed for chest pain.   pantoprazole 40 MG tablet Commonly known as:  PROTONIX Take 40 mg by mouth 2 (two) times daily.   polyethylene glycol packet Commonly known as:  MIRALAX / GLYCOLAX Take 17 g by mouth daily as needed.   pramipexole 1 MG tablet Commonly known as:  MIRAPEX Take 0.5-1 mg by mouth 2 (two) times daily. 0.5 mg during the day prn and 1 mg at bedtime   PROBIOTIC DAILY Caps Take 1 capsule by mouth 3 (three) times a week.   QUEtiapine 100 MG tablet Commonly known as:  SEROQUEL Take 1 tablet (100 mg total) by  mouth at bedtime. Only gives 100 mg four times a week What changed:  how much to take   sertraline 25 MG tablet Commonly known as:  ZOLOFT Take 1 tablet (25 mg total) by mouth at bedtime.   TUDORZA PRESSAIR 400 MCG/ACT Aepb Generic drug:  Aclidinium Bromide Inhale 1 puff into the lungs every other day.   Vitamin D3 1000 units Caps Take 1 capsule by mouth daily.      Follow-up Information    Marden NobleGates, Robert, MD. Schedule an appointment as soon as possible for a visit in 1 week(s).   Specialty:  Internal Medicine Contact information: [address] [place] [address] [phone]            The results of significant diagnostics from this hospitalization (including imaging, microbiology, ancillary and laboratory) are listed below for reference.    Significant Diagnostic Studies: Dg Chest 2 View  Result Date: 08/17/2017 CLINICAL DATA:  Sepsis, cough, COPD EXAM: CHEST  2 VIEW COMPARISON:  04/17/2014 FINDINGS: The lungs are hyperinflated likely secondary to COPD. There is right lower lobe hazy airspace disease concerning for pneumonia. There is no pleural effusion or pneumothorax. There is mild stable cardiomegaly. There is thoracic aortic atherosclerosis. The osseous structures  are unremarkable. There is a spinal stimulator with the metallic portion projecting over the midthoracic spine. IMPRESSION: 1. Right lower lobe hazy airspace disease concerning for pneumonia. 2. COPD. Electronically Signed   By: Elige KoHetal  Patel   On: 08/17/2017 09:37   Ct Head Wo Contrast  Result Date: 08/17/2017 CLINICAL DATA:  Altered level of consciousness. EXAM: CT HEAD WITHOUT CONTRAST TECHNIQUE: Contiguous axial images were obtained from the base of the skull through the vertex without intravenous contrast. COMPARISON:  CT scan of March 04, 2015. FINDINGS: Brain: Mild diffuse cortical atrophy is noted. Mild chronic ischemic white matter disease is noted. No mass effect or midline shift is noted. Ventricular size is within normal limits. There is no evidence of mass lesion, hemorrhage or acute infarction. Vascular: No hyperdense vessel or unexpected calcification. Skull: Normal. Negative for fracture or focal lesion. Sinuses/Orbits: Bilateral maxillary, sphenoid and ethmoid sinusitis is noted. Other: None. IMPRESSION: Mild diffuse cortical atrophy. Mild chronic ischemic white matter disease. Bilateral ethmoid, sphenoid and maxillary sinusitis. No acute intracranial abnormality seen. Electronically Signed   By: Lupita RaiderJames  Green Jr, M.D.   On: 08/17/2017 10:55    Microbiology: Recent Results (from the past 240 hour(s))  Blood Culture (routine x 2)     Status: None (Preliminary result)   Collection Time: 08/17/17  9:15 AM  Result Value Ref Range Status   Specimen Description BLOOD RIGHT ANTECUBITAL  Final   Special Requests   Final    BOTTLES DRAWN AEROBIC AND ANAEROBIC Blood Culture adequate volume   Culture   Final    NO GROWTH 4 DAYS Performed at [facility], [address] [place], [place] [address]    Report Status PENDING  Incomplete  Blood Culture (routine x 2)     Status: None (Preliminary result)   Collection Time: 08/17/17  9:15 AM  Result Value Ref Range Status   Specimen  Description BLOOD LEFT ANTECUBITAL  Final   Special Requests   Final    BOTTLES DRAWN AEROBIC AND ANAEROBIC Blood Culture adequate volume   Culture   Final    NO GROWTH 4 DAYS Performed at [facility], [address] [place], [place] [address]    Report Status PENDING  Incomplete  MRSA PCR Screening     Status: None   Collection Time: 08/17/17 12:25 PM  Result Value Ref Range Status   MRSA by PCR NEGATIVE NEGATIVE Final    Comment:        The GeneXpert MRSA Assay (FDA approved for NASAL specimens only), is one component of a comprehensive MRSA colonization surveillance program. It is not intended to diagnose MRSA infection nor to guide or monitor treatment for MRSA infections.      Labs: Basic Metabolic Panel: Recent Labs  Lab 08/17/17 0915 08/18/17 1610 08/19/17 0324 08/20/17 0504 08/21/17 0411 08/22/17 0533  NA 140 140 140  --  140 138  K 3.7 3.3* 3.9  --  3.3* 3.6  CL 105 111 111  --  107 104  CO2 26 24 24   --  26 26  GLUCOSE 116* 98 78  --  86 91  BUN 25* 14 11  --  10 14  CREATININE 1.03* 0.64 0.67 0.67 0.63 0.77  CALCIUM 8.8* 7.9* 8.2*  --  8.4* 8.5*   Liver Function Tests: Recent Labs  Lab 08/17/17 0915 08/18/17 0633  AST 32 25  ALT 19 19  ALKPHOS 58 47  BILITOT 0.9 0.6  PROT 7.0 5.7*  ALBUMIN 3.5 2.7*   No results for input(s): LIPASE, AMYLASE in the last 168 hours. No results for input(s): AMMONIA in the last 168 hours. CBC: Recent Labs  Lab 08/17/17 0915 08/18/17 0633 08/19/17 0324 08/21/17 0411 08/22/17 0533  WBC 11.3* 6.3 5.1 4.7 6.4  HGB 11.9* 9.6* 10.5* 11.5* 11.5*  HCT 36.7 29.7* 33.5* 35.9* 35.6*  MCV 94.6 92.8 94.6 92.3 92.7  PLT 230 172 232 286 297   Cardiac Enzymes: No results for input(s): CKTOTAL, CKMB, CKMBINDEX, TROPONINI in the last 168 hours. BNP: BNP (last 3 results) No results for input(s): BNP in the last 8760 hours.  ProBNP (last 3 results) No results for input(s): PROBNP in the last 8760  hours.  CBG: Recent Labs  Lab 08/17/17 0939  GLUCAP 95

## 2017-08-23 NOTE — Progress Notes (Signed)
Reviewed chart and spoke with pt's daughter- plan for DC to SNF today- [facility]. [facility] and awaiting work from facility re: expectation for pt to arrive for admission today. Provided DC summary via the HUB. Will follow and assist with transfer once receiving confirmation from facility and pt's daughter arrives at hospital.  Ilean SkillMeghan Anani Gu, MSW, LCSW Clinical Social Work 08/23/2017 [phone]

## 2017-08-23 NOTE — Progress Notes (Signed)
Sent updated DC summary to [facility] via the HUB.

## 2017-08-23 NOTE — Progress Notes (Signed)
Patient seated examined at the bedside. Patient is medically stable for discharge to skilled nursing facility today. Please refer to discharge summary completed 08/22/2017. No changes in medications since 08/22/2017.  Deborah Passeylma [name] [facility] [phone]

## 2017-08-23 NOTE — Progress Notes (Signed)
Dr. Elisabeth Pigeonevine updated via phone on 2L [place] o2 Sat 100%. Oxygen 1 L N with O2 Sats 94%. Per MD ok to discharge to SNF and order placed for PRN O2 1 l [place] for room air Sats 87 or lower.Social Worker will update facility.

## 2017-08-23 NOTE — Progress Notes (Signed)
Pt to be [facility] here to transport Pt. O2 Sat now noted to be 89-90%. VS are otherwise without changes noted. MD updated via phone and will hold discharge at this time and place Pt on 2l [place]. Will monitor Pt closely.

## 2017-08-23 NOTE — Progress Notes (Signed)
Made addendum to DC summary for PRN oxygen 1 L [place] if Oxygen saturation 87% or below.  Manson PasseyAlma Deandre [name]

## 2017-08-23 NOTE — Clinical Social Work Placement (Signed)
Pt discharged and will admit to [facility] report # [phone] Pt's daughter [name] agreeable to plan- is on her way to hospital with plan to transport pt in her vehicle. All information provided to facility via the HUB See below for placement details   CLINICAL SOCIAL WORK PLACEMENT  NOTE  Date:  08/23/2017  Patient Details  Name: [name] MRN: [id number] Date of Birth: May 25, 1929  Clinical Social Work is seeking post-discharge placement for this patient at the Skilled  Nursing Facility level of care (*CSW will initial, date and re-position this form in  chart as items are completed):  Yes   Patient/family provided with [place] Clinical Social Work Department's list of facilities offering this level of care within the geographic area requested by the patient (or if unable, by the patient's family).  Yes   Patient/family informed of their freedom to choose among providers that offer the needed level of care, that participate in Medicare, Medicaid or managed care program needed by the patient, have an available bed and are willing to accept the patient.  Yes   Patient/family informed of [place]'s ownership interest in [facility] and [facility], as well as of the fact that they are under no obligation to receive care at these facilities.  PASRR submitted to EDS on 08/20/17     PASRR number received on 08/20/17     Existing PASRR number confirmed on       FL2 transmitted to all facilities in geographic area requested by pt/family on 08/20/17     FL2 transmitted to all facilities within larger geographic area on       Patient informed that his/her managed care company has contracts with or will negotiate with certain facilities, including the following:        Yes   Patient/family informed of bed offers received.  [facility]     Physician recommends and patient chooses bed at [facility]      Patient to be transferred to [facility] on 08/23/17.  Patient to be transferred to facility by daughter     Patient family notified on 08/23/17 of transfer.  Name of family member notified:  daughter [name]     PHYSICIAN       Additional Comment:    _______________________________________________ Nelwyn SalisburyMeghan R Hurschel Paynter, LCSW 08/23/2017, 11:34 AM [phone]

## 2017-08-24 ENCOUNTER — Non-Acute Institutional Stay (SKILLED_NURSING_FACILITY): Payer: Medicare Other | Admitting: Internal Medicine

## 2017-08-24 ENCOUNTER — Encounter: Payer: Self-pay | Admitting: Internal Medicine

## 2017-08-24 DIAGNOSIS — J449 Chronic obstructive pulmonary disease, unspecified: Secondary | ICD-10-CM

## 2017-08-24 DIAGNOSIS — I5042 Chronic combined systolic (congestive) and diastolic (congestive) heart failure: Secondary | ICD-10-CM | POA: Diagnosis not present

## 2017-08-24 DIAGNOSIS — F29 Unspecified psychosis not due to a substance or known physiological condition: Secondary | ICD-10-CM

## 2017-08-24 DIAGNOSIS — A403 Sepsis due to Streptococcus pneumoniae: Secondary | ICD-10-CM | POA: Diagnosis not present

## 2017-08-24 DIAGNOSIS — I456 Pre-excitation syndrome: Secondary | ICD-10-CM | POA: Diagnosis not present

## 2017-08-24 DIAGNOSIS — I10 Essential (primary) hypertension: Secondary | ICD-10-CM | POA: Diagnosis not present

## 2017-08-24 DIAGNOSIS — J13 Pneumonia due to Streptococcus pneumoniae: Secondary | ICD-10-CM | POA: Diagnosis not present

## 2017-08-24 DIAGNOSIS — I25118 Atherosclerotic heart disease of native coronary artery with other forms of angina pectoris: Secondary | ICD-10-CM | POA: Diagnosis not present

## 2017-08-24 NOTE — Progress Notes (Signed)
: Provider:  Randon Goldsmith. Lyn Hollingshead, MD Location:  [facility] Room Number: 511 Place of Service:  SNF ([phone])  PCP: [name], MD Patient Care Team: [name], MD as PCP - General (Internal Medicine)  Extended Emergency Contact Information Primary Emergency Contact: Alden Benjamin of [place] Home Phone: [phone] Mobile Phone: [phone] Relation: Daughter     Allergies: Amoxicillin; Ciprofloxacin; Lorabid [loracarbef]; Avelox [moxifloxacin]; Erythromycin; Inderal [propranolol]; and Chantix [varenicline]  Chief Complaint  Patient presents with  . New Admit To SNF    following hospitalization 08/17/17 to 08/23/17 sepsis due to RLL pneumonia.    HPI: Patient is 83 y.o. female with dementia, chronic combined systolic and diastolic congestive heart failure, COPD, hypertension, prior tobacco abuse, who thrush [facility] ED for increased confusions time one week and hallucinations for 3-4 days. Patient also has been sleeping more than usual. Patient has a chronic cough that per daughter the cough seems to be worse the last couple of days. She has not noticed any fever or chills. In ED patient was found to be septic with a fever 102.5, heart rate in the 120s, lactic acid 2.1, white count 11.3. Patient was admitted to [facility] from 12/20-26 where she was treated for sepsis and right lower lobe pneumonia. Patient was influenza negative strep pneumo positive, was treated with broad-spectrum antibiotics. Patient's other medical problems were all stable. Patient is admitted to skilled nursing facility with generalized weakness for OT/PT. While at skilled nursing facility patient will be followed for congestive heart failure treated with losartan, hypertension treated with losartan and coronary artery disease treated with aspirin, losartan and nitroglycerin sublingual when necessary.,  Past Medical History:  Diagnosis Date  . Abnormal  cardiovascular stress test 01/17/2013   Decreased LV function with mild distal anterior perfusion abnormality   . Acute combined systolic and diastolic heart failure (HCC) 01/17/2013  . Chronic combined systolic and diastolic CHF (congestive heart failure) (HCC) 08/17/2017  . COPD (chronic obstructive pulmonary disease) (HCC)   . Hypertension   . Restless leg syndrome   . Right lower lobe pneumonia (HCC) 08/17/2017  . Sepsis (HCC) 08/17/2017  . Tobacco use disorder   . WPW (Wolff-Parkinson-White syndrome)     Past Surgical History:  Procedure Laterality Date  . APPENDECTOMY    . BACK SURGERY     MULTIPLE  . HEMANGIOMA EXCISION    . KIDNEY SURGERY     RT KIDNEY TACK  . NECK SURGERY    . TONSILLECTOMY      Allergies as of 08/24/2017      Reactions   Amoxicillin Anaphylaxis, Rash   Ciprofloxacin Anaphylaxis, Other (See Comments)   Thrush   Lorabid [loracarbef] Anaphylaxis, Rash   Avelox [moxifloxacin] Other (See Comments)   Tendon and joint pain   Erythromycin Other (See Comments)   angioedema   Inderal [propranolol] Other (See Comments)   Vasculitis   Chantix [varenicline] Nausea Only      Medication List        Accurate as of 08/24/17 10:18 AM. Always use your most recent med list.          acetaminophen 500 MG tablet Commonly known as:  TYLENOL Take 500 mg by mouth every 6 (six) hours as needed for moderate pain.   albuterol (2.5 MG/3ML) 0.083% nebulizer solution Commonly known as:  PROVENTIL Take 2.5 mg by nebulization 3 (three) times daily as needed for wheezing or shortness of breath.   aspirin  81 MG EC tablet Take 1 tablet (81 mg total) by mouth daily.   B Complex-B12 Tabs Take 1 tablet by mouth daily.   BREO ELLIPTA 200-25 MCG/INH Aepb Generic drug:  fluticasone furoate-vilanterol Inhale 1 puff into the lungs at bedtime.   cyanocobalamin 100 MCG tablet Take 1 tablet (100 mcg total) by mouth daily.   doxycycline 100 MG tablet Commonly known as:   VIBRA-TABS Take 1 tablet (100 mg total) by mouth every 12 (twelve) hours for 7 days.   fentaNYL 25 MCG/HR patch Commonly known as:  DURAGESIC - dosed mcg/hr Place 1 patch (25 mcg total) onto the skin every 3 (three) days.   haloperidol 0.5 MG tablet Commonly known as:  HALDOL Take 1 tablet (0.5 mg total) by mouth every 6 (six) hours as needed for agitation.   LORazepam 1 MG tablet Commonly known as:  ATIVAN Take 0.5 tablets (0.5 mg total) by mouth 2 (two) times daily.   losartan 50 MG tablet Commonly known as:  COZAAR Take 50 mg by mouth daily.   Magnesium 250 MG Tabs Take 1 tablet by mouth daily.   methocarbamol 500 MG tablet Commonly known as:  ROBAXIN Take 250 mg by mouth at bedtime as needed for spasms.   nitroGLYCERIN 0.4 MG SL tablet Commonly known as:  NITROSTAT Place 1 tablet (0.4 mg total) under the tongue every 5 (five) minutes x 3 doses as needed for chest pain.   pantoprazole 40 MG tablet Commonly known as:  PROTONIX Take 40 mg by mouth 2 (two) times daily.   polyethylene glycol packet Commonly known as:  MIRALAX / GLYCOLAX Take 17 g by mouth daily as needed.   pramipexole 1 MG tablet Commonly known as:  MIRAPEX Take 0.5-1 mg by mouth 2 (two) times daily. 0.5 mg during the day prn and 1 mg at bedtime   PROBIOTIC DAILY Caps Take 1 capsule by mouth 3 (three) times a week.   QUEtiapine 100 MG tablet Commonly known as:  SEROQUEL Take 1 tablet (100 mg total) by mouth at bedtime. Only gives 100 mg four times a week   sertraline 25 MG tablet Commonly known as:  ZOLOFT Take 1 tablet (25 mg total) by mouth at bedtime.   TUDORZA PRESSAIR 400 MCG/ACT Aepb Generic drug:  Aclidinium Bromide Inhale 1 puff into the lungs every other day.   Vitamin D3 1000 units Caps Take 1 capsule by mouth daily.       No orders of the defined types were placed in this encounter.    There is no immunization history on file for this patient.  Social History   Tobacco  Use  . Smoking status: Current Every Day Smoker  . Smokeless tobacco: Never Used  Substance Use Topics  . Alcohol use: No    Family history is   History reviewed. No pertinent family history.    Review of Systems  DATA OBTAINED: from patient, nurse GENERAL:  no fevers, fatigue, appetite changes SKIN: No itching, or rash EYES: No eye pain, redness, discharge EARS: No earache, tinnitus, change in hearing NOSE: No congestion, drainage or bleeding  MOUTH/THROAT: No mouth or tooth pain, No sore throat RESPIRATORY: No cough, wheezing, SOB CARDIAC: No chest pain, palpitations, lower extremity edema  GI: No abdominal pain, No N/V/D or constipation, No heartburn or reflux  GU: No dysuria, frequency or urgency, or incontinence  MUSCULOSKELETAL: No unrelieved bone/joint pain NEUROLOGIC: No headache, dizziness or focal weakness PSYCHIATRIC: No c/o anxiety or sadness  Vitals:   08/24/17 0959  BP: 114/72  Pulse: 90  Resp: 20  Temp: 97.9 F (36.6 C)  SpO2: 98%    SpO2 Readings from Last 1 Encounters:  08/24/17 98%   Body mass index is 18.71 kg/m.     Physical Exam  GENERAL APPEARANCE: Alert, conversant,  No acute distress.  SKIN: No diaphoresis rash HEAD: Normocephalic, atraumatic  EYES: Conjunctiva/lids clear. Pupils round, reactive. EOMs intact.  EARS: External exam WNL, canals clear. Hearing grossly normal.  NOSE: No deformity or discharge.  MOUTH/THROAT: Lips w/o lesions  RESPIRATORY: Breathing is even, unlabored. Lung sounds are clear   CARDIOVASCULAR: Heart RRR no murmurs, rubs or gallops. No peripheral edema.   GASTROINTESTINAL: Abdomen is soft, non-tender, not distended w/ normal bowel sounds. GENITOURINARY: Bladder non tender, not distended  MUSCULOSKELETAL: No abnormal joints or musculature NEUROLOGIC:  Cranial nerves 2-12 grossly intact. Moves all extremities  PSYCHIATRIC: Mood and affect appropriate to situation with dementia, no behavioral  issues  Patient Active Problem List   Diagnosis Date Noted  . Sepsis (HCC) 08/17/2017  . Chronic combined systolic and diastolic CHF (congestive heart failure) (HCC) 08/17/2017  . Right lower lobe pneumonia (HCC) 08/17/2017  . Acute combined systolic and diastolic heart failure (HCC) 01/17/2013  . Abnormal cardiovascular stress test 01/17/2013    Class: Acute  . Tobacco use disorder   . WPW (Wolff-Parkinson-White syndrome)       Labs reviewed: Basic Metabolic Panel:    Component Value Date/Time   NA 138 08/22/2017 0533   K 3.6 08/22/2017 0533   CL 104 08/22/2017 0533   CO2 26 08/22/2017 0533   GLUCOSE 91 08/22/2017 0533   BUN 14 08/22/2017 0533   CREATININE 0.77 08/22/2017 0533   CALCIUM 8.5 (L) 08/22/2017 0533   PROT 5.7 (L) 08/18/2017 0633   ALBUMIN 2.7 (L) 08/18/2017 0633   AST 25 08/18/2017 0633   ALT 19 08/18/2017 0633   ALKPHOS 47 08/18/2017 0633   BILITOT 0.6 08/18/2017 0633   GFRNONAA >60 08/22/2017 0533   GFRAA >60 08/22/2017 0533    Recent Labs    08/19/17 0324 08/20/17 0504 08/21/17 0411 08/22/17 0533  NA 140  --  140 138  K 3.9  --  3.3* 3.6  CL 111  --  107 104  CO2 24  --  26 26  GLUCOSE 78  --  86 91  BUN 11  --  10 14  CREATININE 0.67 0.67 0.63 0.77  CALCIUM 8.2*  --  8.4* 8.5*   Liver Function Tests: Recent Labs    08/17/17 0915 08/18/17 0633  AST 32 25  ALT 19 19  ALKPHOS 58 47  BILITOT 0.9 0.6  PROT 7.0 5.7*  ALBUMIN 3.5 2.7*   No results for input(s): LIPASE, AMYLASE in the last 8760 hours. No results for input(s): AMMONIA in the last 8760 hours. CBC: Recent Labs    08/19/17 0324 08/21/17 0411 08/22/17 0533  WBC 5.1 4.7 6.4  HGB 10.5* 11.5* 11.5*  HCT 33.5* 35.9* 35.6*  MCV 94.6 92.3 92.7  PLT 232 286 297   Lipid No results for input(s): CHOL, HDL, LDLCALC, TRIG in the last 8760 hours.  Cardiac Enzymes: No results for input(s): CKTOTAL, CKMB, CKMBINDEX, TROPONINI in the last 8760 hours. BNP: No results for  input(s): BNP in the last 8760 hours. No results found for: MICROALBUR No results found for: HGBA1C No results found for: TSH No results found for: VITAMINB12 No results found for: FOLATE  No results found for: IRON, TIBC, FERRITIN  Imaging and Procedures obtained prior to SNF admission: Dg Chest 2 View  Result Date: 08/17/2017 CLINICAL DATA:  Sepsis, cough, COPD EXAM: CHEST  2 VIEW COMPARISON:  04/17/2014 FINDINGS: The lungs are hyperinflated likely secondary to COPD. There is right lower lobe hazy airspace disease concerning for pneumonia. There is no pleural effusion or pneumothorax. There is mild stable cardiomegaly. There is thoracic aortic atherosclerosis. The osseous structures are unremarkable. There is a spinal stimulator with the metallic portion projecting over the midthoracic spine. IMPRESSION: 1. Right lower lobe hazy airspace disease concerning for pneumonia. 2. COPD. Electronically Signed   By: Elige Ko   On: 08/17/2017 09:37   Ct Head Wo Contrast  Result Date: 08/17/2017 CLINICAL DATA:  Altered level of consciousness. EXAM: CT HEAD WITHOUT CONTRAST TECHNIQUE: Contiguous axial images were obtained from the base of the skull through the vertex without intravenous contrast. COMPARISON:  CT scan of March 04, 2015. FINDINGS: Brain: Mild diffuse cortical atrophy is noted. Mild chronic ischemic white matter disease is noted. No mass effect or midline shift is noted. Ventricular size is within normal limits. There is no evidence of mass lesion, hemorrhage or acute infarction. Vascular: No hyperdense vessel or unexpected calcification. Skull: Normal. Negative for fracture or focal lesion. Sinuses/Orbits: Bilateral maxillary, sphenoid and ethmoid sinusitis is noted. Other: None. IMPRESSION: Mild diffuse cortical atrophy. Mild chronic ischemic white matter disease. Bilateral ethmoid, sphenoid and maxillary sinusitis. No acute intracranial abnormality seen. Electronically Signed   By: Lupita Raider, M.D.   On: 08/17/2017 10:55     Not all labs, radiology exams or other studies done during hospitalization come through on my EPIC note; however they are reviewed by me.    Assessment and Plan  Sepsis/right lower lobe pneumococcal pneumonia-treated with broad-spectrum IV antibiotics; plan to continue doxycycline on discharge SNF -admitted with generalized weakness for OT/PT plan to continue doxycycline 100 mg twice a day for 7 days  Chronic combined systolic and diastolic congestive heart failure-last echo 2014 with an EF of 45-50%; has been stable during hospitalization SNF - patient is on losartan 50 mg daily only; patient is not on any diuretic  History of Wolff-Parkinson-White-status post ablation in past by Dr. [name]  Hypertension SNF - stable; continue losartan 50 mg by mouth daily  CAD SNF -stable; no chest pain; continue ASA 81 mg daily, losartan 50 mg daily and nitroglycerin sublingual 1/150 when necessary  COPD-stable SNF - continue breo-ellipta 200-25 one puff daily at bedtime,and tudorza 400 g 1 puff into the lungs every other day with when necessary albuterol  Psychosis SNF - continue Seroquel 100 mg by mouth daily at bedtime with when necessary Haldol 0.5 mg every 6   Time spent greater than 45 minutes;> 50% of time with patient was spent reviewing records, labs, tests and studies, counseling and developing plan of care  Thurston Hole D. Lyn Hollingshead, MD

## 2017-08-26 ENCOUNTER — Encounter: Payer: Self-pay | Admitting: Internal Medicine

## 2017-08-26 DIAGNOSIS — J13 Pneumonia due to Streptococcus pneumoniae: Secondary | ICD-10-CM | POA: Insufficient documentation

## 2017-08-26 DIAGNOSIS — I2510 Atherosclerotic heart disease of native coronary artery without angina pectoris: Secondary | ICD-10-CM | POA: Insufficient documentation

## 2017-08-26 DIAGNOSIS — F29 Unspecified psychosis not due to a substance or known physiological condition: Secondary | ICD-10-CM | POA: Insufficient documentation

## 2017-08-26 DIAGNOSIS — I10 Essential (primary) hypertension: Secondary | ICD-10-CM | POA: Insufficient documentation

## 2017-08-26 DIAGNOSIS — J449 Chronic obstructive pulmonary disease, unspecified: Secondary | ICD-10-CM | POA: Insufficient documentation

## 2017-08-30 ENCOUNTER — Emergency Department (HOSPITAL_COMMUNITY): Payer: Medicare Other

## 2017-08-30 ENCOUNTER — Encounter (HOSPITAL_COMMUNITY): Payer: Self-pay | Admitting: Emergency Medicine

## 2017-08-30 ENCOUNTER — Emergency Department (HOSPITAL_COMMUNITY)
Admission: EM | Admit: 2017-08-30 | Discharge: 2017-08-30 | Disposition: A | Discharge status: Home / Self Care | Payer: Medicare Other | Attending: Emergency Medicine | Admitting: Emergency Medicine

## 2017-08-30 DIAGNOSIS — Z79899 Other long term (current) drug therapy: Secondary | ICD-10-CM | POA: Diagnosis not present

## 2017-08-30 DIAGNOSIS — F0390 Unspecified dementia without behavioral disturbance: Secondary | ICD-10-CM | POA: Diagnosis not present

## 2017-08-30 DIAGNOSIS — I2510 Atherosclerotic heart disease of native coronary artery without angina pectoris: Secondary | ICD-10-CM | POA: Insufficient documentation

## 2017-08-30 DIAGNOSIS — J449 Chronic obstructive pulmonary disease, unspecified: Secondary | ICD-10-CM | POA: Diagnosis not present

## 2017-08-30 DIAGNOSIS — M25552 Pain in left hip: Secondary | ICD-10-CM | POA: Diagnosis present

## 2017-08-30 DIAGNOSIS — I110 Hypertensive heart disease with heart failure: Secondary | ICD-10-CM | POA: Insufficient documentation

## 2017-08-30 DIAGNOSIS — I5043 Acute on chronic combined systolic (congestive) and diastolic (congestive) heart failure: Secondary | ICD-10-CM | POA: Diagnosis not present

## 2017-08-30 DIAGNOSIS — Z7982 Long term (current) use of aspirin: Secondary | ICD-10-CM | POA: Diagnosis not present

## 2017-08-30 DIAGNOSIS — F17200 Nicotine dependence, unspecified, uncomplicated: Secondary | ICD-10-CM | POA: Insufficient documentation

## 2017-08-30 NOTE — Discharge Instructions (Signed)
Your hip did not show a fracture on her CAT scan.  Follow-up with your doctors or orthopedic surgeon as needed.

## 2017-08-30 NOTE — ED Notes (Signed)
Bed: WA09 Expected date:  Expected time:  Means of arrival:  Comments: EMS-left hip fracture

## 2017-08-30 NOTE — ED Notes (Signed)
Per patient's daughter, patient "cannot have a MRI because she has a metal plate in her neck." MD made aware.

## 2017-08-30 NOTE — ED Notes (Signed)
Family at bedside. 

## 2017-08-30 NOTE — ED Notes (Signed)
ED Provider at bedside. 

## 2017-08-30 NOTE — ED Notes (Signed)
[facility] at [facility]. Patient is being transported via daughter. All forms sent back with patient including yellow DNR paperwork.

## 2017-08-30 NOTE — ED Provider Notes (Signed)
[facility]-EMERGENCY DEPT Provider Note   CSN: [id number] Arrival date & time: 08/30/17  1221     History   Chief Complaint Chief Complaint  Patient presents with  . Hip Pain    HPI [name] is a 84 y.o. female.  HPI Patient with patient reportedly sent in from nursing home.  Per nursing report patient has had chronic pain in her left hip and had an x-ray that showed reported left femoral head fracture.  Pain is reportedly been having pain for years.  States she can walk on it.  Patient however states she hurts more on the right hip compared to the left.  There is a reported history of dementia.  X-ray imaging or reports not appear to have been sent with the patient Past Medical History:  Diagnosis Date  . Abnormal cardiovascular stress test 01/17/2013   Decreased LV function with mild distal anterior perfusion abnormality   . Acute combined systolic and diastolic heart failure (HCC) 01/17/2013  . Chronic combined systolic and diastolic CHF (congestive heart failure) (HCC) 08/17/2017  . COPD (chronic obstructive pulmonary disease) (HCC)   . Hypertension   . Restless leg syndrome   . Right lower lobe pneumonia (HCC) 08/17/2017  . Sepsis (HCC) 08/17/2017  . Tobacco use disorder   . WPW (Wolff-Parkinson-White syndrome)     Patient Active Problem List   Diagnosis Date Noted  . Pneumococcal pneumonia (HCC) 08/26/2017  . Hypertension 08/26/2017  . COPD (chronic obstructive pulmonary disease) (HCC) 08/26/2017  . Psychosis (HCC) 08/26/2017  . Coronary artery disease 08/26/2017  . Sepsis (HCC) 08/17/2017  . Chronic combined systolic and diastolic CHF (congestive heart failure) (HCC) 08/17/2017  . Right lower lobe pneumonia (HCC) 08/17/2017  . Acute combined systolic and diastolic heart failure (HCC) 01/17/2013  . Abnormal cardiovascular stress test 01/17/2013    Class: Acute  . Tobacco use disorder   . WPW (Wolff-Parkinson-White syndrome)     Past  Surgical History:  Procedure Laterality Date  . APPENDECTOMY    . BACK SURGERY     MULTIPLE  . HEMANGIOMA EXCISION    . KIDNEY SURGERY     RT KIDNEY TACK  . NECK SURGERY    . TONSILLECTOMY      OB History    No data available       Home Medications    Prior to Admission medications   Medication Sig Start Date End Date Taking? Authorizing Provider  acetaminophen (TYLENOL) 500 MG tablet Take 500 mg by mouth every 6 (six) hours as needed for moderate pain.   Yes [provider]  Aclidinium Bromide (TUDORZA PRESSAIR) 400 MCG/ACT AEPB Inhale 1 puff into the lungs every other day.   Yes [provider]  albuterol (PROVENTIL) (2.5 MG/3ML) 0.083% nebulizer solution Take 2.5 mg by nebulization 3 (three) times daily as needed for wheezing or shortness of breath.    Yes [provider]  aspirin EC 81 MG EC tablet Take 1 tablet (81 mg total) by mouth daily. 12/22/12  Yes Corky Crafts, MD  B Complex Vitamins (B COMPLEX-B12) TABS Take 1 tablet by mouth daily.   Yes [provider]  Cholecalciferol (VITAMIN D3) 1000 units CAPS Take 1 capsule by mouth daily.   Yes [provider]  fentaNYL (DURAGESIC - DOSED MCG/HR) 25 MCG/HR patch Place 1 patch (25 mcg total) onto the skin every 3 (three) days. 08/21/17  Yes Alison Murray, MD  fluticasone furoate-vilanterol (BREO ELLIPTA) [phone]  MCG/INH AEPB Inhale 1 puff into the lungs at bedtime.   Yes [provider]  LORazepam (ATIVAN) 1 MG tablet Take 0.5 tablets (0.5 mg total) by mouth 2 (two) times daily. 08/21/17  Yes Alison Murray, MD  losartan (COZAAR) 50 MG tablet Take 50 mg by mouth daily. 08/09/17  Yes [provider]  Magnesium 250 MG TABS Take 1 tablet by mouth daily.   Yes [provider]  methocarbamol (ROBAXIN) 500 MG tablet Take 250 mg by mouth at bedtime as needed for spasms. 06/12/17  Yes [provider]  nitroGLYCERIN (NITROSTAT) 0.4 MG SL tablet Place 1  tablet (0.4 mg total) under the tongue every 5 (five) minutes x 3 doses as needed for chest pain. 12/22/12  Yes Corky Crafts, MD  OXYGEN Inhale into the lungs. CONTINUE PRN OXYGEN AT 1 LITERS/NASAL CANNULA FOR OXYGEN SATURATION 87% OR BELOW   Yes [provider]  pantoprazole (PROTONIX) 40 MG tablet Take 40 mg by mouth 2 (two) times daily.   Yes [provider]  pramipexole (MIRAPEX) 1 MG tablet Take 0.5-1 mg by mouth 2 (two) times daily. 0.5 mg during the day prn and 1 mg at bedtime   Yes [provider]  Probiotic Product (PROBIOTIC DAILY) CAPS Take 1 capsule by mouth 3 (three) times a week.    Yes [provider]  QUEtiapine (SEROQUEL) 100 MG tablet Take 1 tablet (100 mg total) by mouth at bedtime. Only gives 100 mg four times a week 08/21/17  Yes Alison Murray, MD  sertraline (ZOLOFT) 25 MG tablet Take 1 tablet (25 mg total) by mouth at bedtime. 08/21/17  Yes Alison Murray, MD  vitamin B-12 100 MCG tablet Take 1 tablet (100 mcg total) by mouth daily. 08/23/17  Yes Alison Murray, MD  haloperidol (HALDOL) 0.5 MG tablet Take 1 tablet (0.5 mg total) by mouth every 6 (six) hours as needed for agitation. Patient not taking: Reported on 08/30/2017 08/21/17   Alison Murray, MD    Family History No family history on file.  Social History Social History   Tobacco Use  . Smoking status: Current Every Day Smoker  . Smokeless tobacco: Never Used  Substance Use Topics  . Alcohol use: No  . Drug use: No     Allergies   Amoxicillin; Ciprofloxacin; Lorabid [loracarbef]; Avelox [moxifloxacin]; Erythromycin; Inderal [propranolol]; and Chantix [varenicline]   Review of Systems Review of Systems  Unable to perform ROS: Dementia     Physical Exam Updated Vital Signs BP 115/65 (BP Location: Right Arm)   Pulse 93   Temp 98 F (36.7 C) (Oral)   Resp 18   SpO2 97%   Physical Exam  Constitutional: She appears well-developed.  HENT:  Head:  Normocephalic.  Eyes: Pupils are equal, round, and reactive to light.  Cardiovascular:  Tachycardia  Pulmonary/Chest: Effort normal.  Abdominal: There is no tenderness.  Musculoskeletal: She exhibits tenderness.  Good range of motion in bilateral hips.  Some tenderness over left hip laterally.  Neurovascular intact in feet.  Pelvis stable.  Neurological:  Patient is awake and pleasant  Skin: Skin is warm. Capillary refill takes less than 2 seconds.  Psychiatric: She has a normal mood and affect.     ED Treatments / Results  Labs (all labs ordered are listed, but only abnormal results are displayed) Labs Reviewed - No data to display  EKG  EKG Interpretation None       Radiology Ct  Hip Left Wo Contrast  Result Date: 08/30/2017 CLINICAL DATA:  Chronic left hip pain for the past 3 years. Possible subcapital left femoral neck fracture seen on x-ray. EXAM: CT OF THE LEFT HIP WITHOUT CONTRAST TECHNIQUE: Multidetector CT imaging of the left hip was performed according to the standard protocol. Multiplanar CT image reconstructions were also generated. COMPARISON:  Bilateral hip x-rays from same day. CT abdomen pelvis dated January 31, 2005. FINDINGS: Bones/Joint/Cartilage No fracture or dislocation. Irregularity along the lateral subcapital femoral neck is degenerative. Mild posterior left hip joint space narrowing. Normal alignment. No joint effusion. Osteopenia. Ligaments Ligaments are suboptimally evaluated by CT. Muscles and Tendons Mild atrophy of the left gluteus minimus muscle. Soft tissue No fluid collection or hematoma. No soft tissue mass. The visualized intrapelvic contents are unremarkable. IMPRESSION: 1. No hip fracture. Irregularity along the lateral subcapital femoral neck is degenerative. Electronically Signed   By: Obie DredgeWilliam T Derry M.D.   On: 08/30/2017 15:43   Dg Hips Bilat W Or Wo Pelvis 5 Views  Result Date: 08/30/2017 CLINICAL DATA:  History of chronic hip pain. Clinical  report of left femur fracture. EXAM: DG HIP (WITH OR WITHOUT PELVIS) 5+V BILAT COMPARISON:  None. FINDINGS: No evidence of pelvic fracture. Right hip appears normal. At the lateral femoral head/ neck, there is a cortical irregularity which is indeterminate. This could be chronic or could represent a minimal femoral neck fracture. Consider MRI for confirmation. IMPRESSION: Indeterminate age cortical irregularity at the lateral subcapital femoral neck. This could be old, but an acute injury is not excluded on the basis of these films. Consider MRI for more accurate evaluation. Electronically Signed   By: Paulina FusiMark  Shogry M.D.   On: 08/30/2017 13:52    Procedures Procedures (including critical care time)  Medications Ordered in ED Medications - No data to display   Initial Impression / Assessment and Plan / ED Course  I have reviewed the triage vital signs and the nursing notes.  Pertinent labs & imaging results that were available during my care of the patient were reviewed by me and considered in my medical decision making (see chart for details).     Patient with reported hip pain.  Has had for years.  X-ray as an outpatient reportedly showed possible fracture.  X-ray here showed possible fracture.  CT scan done due to reported inability to have MRI.  CT scan did not show fracture.  Show degenerative disease.  Discharge home.  Final Clinical Impressions(s) / ED Diagnoses   Final diagnoses:  Left hip pain    ED Discharge Orders    None       Benjiman CorePickering, Raekwan Spelman, MD 08/31/17 [phone]

## 2017-08-30 NOTE — ED Triage Notes (Addendum)
Per EMS, patient coming from rehab, c/o hip pain x3 years. Staff reports XR today showed left femoral head fracture. No reported falls, however staff reports patient is found crawling on the floor often. Hx dementia.  5mg  Albuterol with EMS 20g R hand

## 2017-08-30 NOTE — ED Notes (Signed)
Patient transported to CT 

## 2017-09-06 ENCOUNTER — Encounter: Payer: Self-pay | Admitting: Internal Medicine

## 2017-09-06 ENCOUNTER — Non-Acute Institutional Stay (SKILLED_NURSING_FACILITY): Payer: Medicare Other | Admitting: Internal Medicine

## 2017-09-06 DIAGNOSIS — I25118 Atherosclerotic heart disease of native coronary artery with other forms of angina pectoris: Secondary | ICD-10-CM | POA: Diagnosis not present

## 2017-09-06 DIAGNOSIS — J449 Chronic obstructive pulmonary disease, unspecified: Secondary | ICD-10-CM | POA: Diagnosis not present

## 2017-09-06 DIAGNOSIS — A403 Sepsis due to Streptococcus pneumoniae: Secondary | ICD-10-CM | POA: Diagnosis not present

## 2017-09-06 DIAGNOSIS — J13 Pneumonia due to Streptococcus pneumoniae: Secondary | ICD-10-CM | POA: Diagnosis not present

## 2017-09-06 DIAGNOSIS — I5042 Chronic combined systolic (congestive) and diastolic (congestive) heart failure: Secondary | ICD-10-CM

## 2017-09-06 DIAGNOSIS — F29 Unspecified psychosis not due to a substance or known physiological condition: Secondary | ICD-10-CM

## 2017-09-06 DIAGNOSIS — I10 Essential (primary) hypertension: Secondary | ICD-10-CM | POA: Diagnosis not present

## 2017-09-06 DIAGNOSIS — I456 Pre-excitation syndrome: Secondary | ICD-10-CM

## 2017-09-06 NOTE — Progress Notes (Signed)
Location:  [facility] Room Number: 100-P Place of Service:  SNF [phone]) Merrilee Seashore MD provider   PCP: [name], MD Patient Care Team: [name], MD as PCP - General (Internal Medicine)  Extended Emergency Contact Information Primary Emergency Contact: Alden Benjamin of [place] Home Phone: [phone] Mobile Phone: [phone] Relation: Daughter  Allergies  Allergen Reactions  . Amoxicillin Anaphylaxis and Rash  . Ciprofloxacin Anaphylaxis and Other (See Comments)    Thrush  . Lorabid [Loracarbef] Anaphylaxis and Rash  . Avelox [Moxifloxacin] Other (See Comments)    Tendon and joint pain  . Erythromycin Other (See Comments)    angioedema  . Inderal [Propranolol] Other (See Comments)    Vasculitis  . Chantix [Varenicline] Nausea Only    Chief Complaint  Patient presents with  . Discharge Note    Discharge Visit     HPI:  84 y.o. female with dementia, chronic combined systolic and diastolic congestive heart failure, COPD, hypertension, prior tobacco abuse, who was admitted to [facility] from 12/20-26 where she was treated for sepsis secondary to right lower lobe pneumonia. Influenza was negative, strep pneumoniae was positive and patient was treated with broad-spectrum antibiotics. Patient's other medical lungs were also stable. Patient was admitted to skilled nursing facility with generalized weakness for OT/PT and is now ready to be discharged to home.     Past Medical History:  Diagnosis Date  . Abnormal cardiovascular stress test 01/17/2013   Decreased LV function with mild distal anterior perfusion abnormality   . Acute combined systolic and diastolic heart failure (HCC) 01/17/2013  . Chronic combined systolic and diastolic CHF (congestive heart failure) (HCC) 08/17/2017  . COPD (chronic obstructive pulmonary disease) (HCC)   . Hypertension   . Restless leg syndrome   . Right lower lobe pneumonia  (HCC) 08/17/2017  . Sepsis (HCC) 08/17/2017  . Tobacco use disorder   . WPW (Wolff-Parkinson-White syndrome)     Past Surgical History:  Procedure Laterality Date  . APPENDECTOMY    . BACK SURGERY     MULTIPLE  . HEMANGIOMA EXCISION    . KIDNEY SURGERY     RT KIDNEY TACK  . NECK SURGERY    . TONSILLECTOMY       reports that she has been smoking.  she has never used smokeless tobacco. She reports that she does not drink alcohol or use drugs. Social History   Socioeconomic History  . Marital status: Married    Spouse name: Not on file  . Number of children: Not on file  . Years of education: Not on file  . Highest education level: Not on file  Social Needs  . Financial resource strain: Not on file  . Food insecurity - worry: Not on file  . Food insecurity - inability: Not on file  . Transportation needs - medical: Not on file  . Transportation needs - non-medical: Not on file  Occupational History  . Not on file  Tobacco Use  . Smoking status: Current Every Day Smoker  . Smokeless tobacco: Never Used  Substance and Sexual Activity  . Alcohol use: No  . Drug use: No  . Sexual activity: No  Other Topics Concern  . Not on file  Social History Narrative   Admitted to [facility] 08/23/17   Married   Current smoker   Alcohol none   DNR    Pertinent  Health Maintenance Due  Topic Date Due  . PNA  vac Low Risk Adult (1 of 2 - PCV13) 11/24/1995  . INFLUENZA VACCINE  03/29/2017  . DEXA SCAN  Completed    Medications: Allergies as of 09/06/2017      Reactions   Amoxicillin Anaphylaxis, Rash   Ciprofloxacin Anaphylaxis, Other (See Comments)   Thrush   Lorabid [loracarbef] Anaphylaxis, Rash   Avelox [moxifloxacin] Other (See Comments)   Tendon and joint pain   Erythromycin Other (See Comments)   angioedema   Inderal [propranolol] Other (See Comments)   Vasculitis   Chantix [varenicline] Nausea Only      Medication List        Accurate as of 09/06/17 11:59 PM.  Always use your most recent med list.          acetaminophen 500 MG tablet Commonly known as:  TYLENOL Take 500 mg by mouth every 6 (six) hours as needed for moderate pain.   albuterol (2.5 MG/3ML) 0.083% nebulizer solution Commonly known as:  PROVENTIL Take 2.5 mg by nebulization 3 (three) times daily as needed for wheezing or shortness of breath.   aspirin 81 MG EC tablet Take 1 tablet (81 mg total) by mouth daily.   B Complex-B12 Tabs Take 1 tablet by mouth daily.   BREO ELLIPTA 200-25 MCG/INH Aepb Generic drug:  fluticasone furoate-vilanterol Inhale 1 puff into the lungs at bedtime.   cyanocobalamin 100 MCG tablet Take 1 tablet (100 mcg total) by mouth daily.   fentaNYL 25 MCG/HR patch Commonly known as:  DURAGESIC - dosed mcg/hr Place 1 patch (25 mcg total) onto the skin every 3 (three) days.   haloperidol 0.5 MG tablet Commonly known as:  HALDOL Take 1 tablet (0.5 mg total) by mouth every 6 (six) hours as needed for agitation.   LORazepam 1 MG tablet Commonly known as:  ATIVAN Take 0.5 tablets (0.5 mg total) by mouth 2 (two) times daily.   losartan 50 MG tablet Commonly known as:  COZAAR Take 50 mg by mouth daily.   Magnesium 250 MG Tabs Take 1 tablet by mouth daily.   methocarbamol 500 MG tablet Commonly known as:  ROBAXIN Take 250 mg by mouth at bedtime as needed for spasms.   nitroGLYCERIN 0.4 MG SL tablet Commonly known as:  NITROSTAT Place 1 tablet (0.4 mg total) under the tongue every 5 (five) minutes x 3 doses as needed for chest pain.   OXYGEN Inhale into the lungs. CONTINUE PRN OXYGEN AT 1 LITERS/NASAL CANNULA FOR OXYGEN SATURATION 87% OR BELOW   pantoprazole 40 MG tablet Commonly known as:  PROTONIX Take 40 mg by mouth 2 (two) times daily.   pramipexole 1 MG tablet Commonly known as:  MIRAPEX Take 0.5-1 mg by mouth 2 (two) times daily. 0.5 mg during the day prn and 1 mg at bedtime   PROBIOTIC DAILY Caps Take 1 capsule by mouth 3 (three)  times a week.   QUEtiapine 100 MG tablet Commonly known as:  SEROQUEL Take 1 tablet (100 mg total) by mouth at bedtime. Only gives 100 mg four times a week   sertraline 25 MG tablet Commonly known as:  ZOLOFT Take 1 tablet (25 mg total) by mouth at bedtime.   TUDORZA PRESSAIR 400 MCG/ACT Aepb Generic drug:  Aclidinium Bromide Inhale 1 puff into the lungs every other day.   Vitamin D3 1000 units Caps Take 1 capsule by mouth daily.        Vitals:   09/06/17 1009  BP: 111/66  Pulse: 98  Resp: 18  Temp: (!) 97.2 F (36.2 C)  TempSrc: Oral  Weight: 99 lb (44.9 kg)  Height: 5\' 1"  (1.549 m)   Body mass index is 18.71 kg/m.  Physical Exam  GENERAL APPEARANCE: Alert, conversant. No acute distress.  HEENT: Unremarkable. RESPIRATORY: Breathing is even, unlabored. Lung sounds are clear   CARDIOVASCULAR: Heart RRR no murmurs, rubs or gallops. No peripheral edema.  GASTROINTESTINAL: Abdomen is soft, non-tender, not distended w/ normal bowel sounds.  NEUROLOGIC: Cranial nerves 2-12 grossly intact. Moves all extremities   Labs reviewed: Basic Metabolic Panel: Recent Labs    08/19/17 0324 08/20/17 0504 08/21/17 0411 08/22/17 0533  NA 140  --  140 138  K 3.9  --  3.3* 3.6  CL 111  --  107 104  CO2 24  --  26 26  GLUCOSE 78  --  86 91  BUN 11  --  10 14  CREATININE 0.67 0.67 0.63 0.77  CALCIUM 8.2*  --  8.4* 8.5*   No results found for: [facility] Liver Function Tests: Recent Labs    08/17/17 0915 08/18/17 0633  AST 32 25  ALT 19 19  ALKPHOS 58 47  BILITOT 0.9 0.6  PROT 7.0 5.7*  ALBUMIN 3.5 2.7*   No results for input(s): LIPASE, AMYLASE in the last 8760 hours. No results for input(s): AMMONIA in the last 8760 hours. CBC: Recent Labs    08/19/17 0324 08/21/17 0411 08/22/17 0533  WBC 5.1 4.7 6.4  HGB 10.5* 11.5* 11.5*  HCT 33.5* 35.9* 35.6*  MCV 94.6 92.3 92.7  PLT 232 286 297   Lipid No results for input(s): CHOL, HDL, LDLCALC, TRIG in the last  8760 hours. Cardiac Enzymes: No results for input(s): CKTOTAL, CKMB, CKMBINDEX, TROPONINI in the last 8760 hours. BNP: No results for input(s): BNP in the last 8760 hours. CBG: Recent Labs    08/17/17 0939  GLUCAP 95    Procedures and Imaging Studies During Stay: Dg Chest 2 View  Result Date: 08/17/2017 CLINICAL DATA:  Sepsis, cough, COPD EXAM: CHEST  2 VIEW COMPARISON:  04/17/2014 FINDINGS: The lungs are hyperinflated likely secondary to COPD. There is right lower lobe hazy airspace disease concerning for pneumonia. There is no pleural effusion or pneumothorax. There is mild stable cardiomegaly. There is thoracic aortic atherosclerosis. The osseous structures are unremarkable. There is a spinal stimulator with the metallic portion projecting over the midthoracic spine. IMPRESSION: 1. Right lower lobe hazy airspace disease concerning for pneumonia. 2. COPD. Electronically Signed   By: Elige KoHetal  Patel   On: 08/17/2017 09:37   Ct Head Wo Contrast  Result Date: 08/17/2017 CLINICAL DATA:  Altered level of consciousness. EXAM: CT HEAD WITHOUT CONTRAST TECHNIQUE: Contiguous axial images were obtained from the base of the skull through the vertex without intravenous contrast. COMPARISON:  CT scan of March 04, 2015. FINDINGS: Brain: Mild diffuse cortical atrophy is noted. Mild chronic ischemic white matter disease is noted. No mass effect or midline shift is noted. Ventricular size is within normal limits. There is no evidence of mass lesion, hemorrhage or acute infarction. Vascular: No hyperdense vessel or unexpected calcification. Skull: Normal. Negative for fracture or focal lesion. Sinuses/Orbits: Bilateral maxillary, sphenoid and ethmoid sinusitis is noted. Other: None. IMPRESSION: Mild diffuse cortical atrophy. Mild chronic ischemic white matter disease. Bilateral ethmoid, sphenoid and maxillary sinusitis. No acute intracranial abnormality seen. Electronically Signed   By: Lupita RaiderJames  Green Jr, M.D.   On:  08/17/2017 10:55   Ct Hip Left Wo Contrast  Result Date: 08/30/2017 CLINICAL DATA:  Chronic left hip pain for the past 3 years. Possible subcapital left femoral neck fracture seen on x-ray. EXAM: CT OF THE LEFT HIP WITHOUT CONTRAST TECHNIQUE: Multidetector CT imaging of the left hip was performed according to the standard protocol. Multiplanar CT image reconstructions were also generated. COMPARISON:  Bilateral hip x-rays from same day. CT abdomen pelvis dated January 31, 2005. FINDINGS: Bones/Joint/Cartilage No fracture or dislocation. Irregularity along the lateral subcapital femoral neck is degenerative. Mild posterior left hip joint space narrowing. Normal alignment. No joint effusion. Osteopenia. Ligaments Ligaments are suboptimally evaluated by CT. Muscles and Tendons Mild atrophy of the left gluteus minimus muscle. Soft tissue No fluid collection or hematoma. No soft tissue mass. The visualized intrapelvic contents are unremarkable. IMPRESSION: 1. No hip fracture. Irregularity along the lateral subcapital femoral neck is degenerative. Electronically Signed   By: Obie Dredge M.D.   On: 08/30/2017 15:43   Dg Hips Bilat W Or Wo Pelvis 5 Views  Result Date: 08/30/2017 CLINICAL DATA:  History of chronic hip pain. Clinical report of left femur fracture. EXAM: DG HIP (WITH OR WITHOUT PELVIS) 5+V BILAT COMPARISON:  None. FINDINGS: No evidence of pelvic fracture. Right hip appears normal. At the lateral femoral head/ neck, there is a cortical irregularity which is indeterminate. This could be chronic or could represent a minimal femoral neck fracture. Consider MRI for confirmation. IMPRESSION: Indeterminate age cortical irregularity at the lateral subcapital femoral neck. This could be old, but an acute injury is not excluded on the basis of these films. Consider MRI for more accurate evaluation. Electronically Signed   By: Paulina Fusi M.D.   On: 08/30/2017 13:52    Assessment/Plan:   Pneumonia of right  lower lobe due to Streptococcus pneumoniae (HCC)  Sepsis due to Streptococcus pneumoniae (HCC)  WPW (Wolff-Parkinson-White syndrome)  Chronic combined systolic and diastolic CHF (congestive heart failure) (HCC)  Coronary artery disease of native artery of native heart with stable angina pectoris (HCC)  Essential hypertension  Chronic obstructive pulmonary disease, unspecified COPD type (HCC)  Psychosis, unspecified psychosis type (HCC)   Patient is being discharged with the following home health services:  OT/PT  Patient is being discharged with the following durable medical equipment:  Standard wheelchair  Patient has been advised to f/u with their PCP in 1-2 weeks to bring them up to date on their rehab stay.  Social services at facility was responsible for arranging this appointment.  Pt was provided with a 30 day supply of prescriptions for medications and refills must be obtained from their PCP.  For controlled substances, a more limited supply may be provided adequate until PCP appointment only.  Medications have been reconciled.  Time spent greater than 35 minutes;> 50% of time with patient was spent reviewing records, labs, tests and studies, counseling and developing plan of care  Merrilee Seashore MD

## 2017-09-08 ENCOUNTER — Encounter: Payer: Self-pay | Admitting: Internal Medicine

## 2017-09-18 ENCOUNTER — Other Ambulatory Visit: Payer: Self-pay

## 2017-09-18 ENCOUNTER — Inpatient Hospital Stay (HOSPITAL_COMMUNITY)
Admission: EM | Admit: 2017-09-18 | Discharge: 2017-09-23 | DRG: 469 | Disposition: A | Discharge status: Skilled Nursing Facility | Payer: Medicare Other | Attending: Internal Medicine | Admitting: Internal Medicine

## 2017-09-18 DIAGNOSIS — I5042 Chronic combined systolic (congestive) and diastolic (congestive) heart failure: Secondary | ICD-10-CM | POA: Diagnosis present

## 2017-09-18 DIAGNOSIS — Z7982 Long term (current) use of aspirin: Secondary | ICD-10-CM

## 2017-09-18 DIAGNOSIS — M25552 Pain in left hip: Secondary | ICD-10-CM | POA: Diagnosis not present

## 2017-09-18 DIAGNOSIS — S72012A Unspecified intracapsular fracture of left femur, initial encounter for closed fracture: Principal | ICD-10-CM | POA: Diagnosis present

## 2017-09-18 DIAGNOSIS — E43 Unspecified severe protein-calorie malnutrition: Secondary | ICD-10-CM | POA: Diagnosis present

## 2017-09-18 DIAGNOSIS — F0390 Unspecified dementia without behavioral disturbance: Secondary | ICD-10-CM | POA: Diagnosis present

## 2017-09-18 DIAGNOSIS — W19XXXA Unspecified fall, initial encounter: Secondary | ICD-10-CM

## 2017-09-18 DIAGNOSIS — Z7951 Long term (current) use of inhaled steroids: Secondary | ICD-10-CM

## 2017-09-18 DIAGNOSIS — S72009A Fracture of unspecified part of neck of unspecified femur, initial encounter for closed fracture: Secondary | ICD-10-CM | POA: Diagnosis present

## 2017-09-18 DIAGNOSIS — I456 Pre-excitation syndrome: Secondary | ICD-10-CM | POA: Diagnosis present

## 2017-09-18 DIAGNOSIS — Z01818 Encounter for other preprocedural examination: Secondary | ICD-10-CM

## 2017-09-18 DIAGNOSIS — N390 Urinary tract infection, site not specified: Secondary | ICD-10-CM | POA: Diagnosis present

## 2017-09-18 DIAGNOSIS — I10 Essential (primary) hypertension: Secondary | ICD-10-CM | POA: Diagnosis present

## 2017-09-18 DIAGNOSIS — Z79891 Long term (current) use of opiate analgesic: Secondary | ICD-10-CM

## 2017-09-18 DIAGNOSIS — W1830XA Fall on same level, unspecified, initial encounter: Secondary | ICD-10-CM | POA: Diagnosis present

## 2017-09-18 DIAGNOSIS — D649 Anemia, unspecified: Secondary | ICD-10-CM | POA: Diagnosis present

## 2017-09-18 DIAGNOSIS — Z681 Body mass index (BMI) 19 or less, adult: Secondary | ICD-10-CM

## 2017-09-18 DIAGNOSIS — Z881 Allergy status to other antibiotic agents status: Secondary | ICD-10-CM

## 2017-09-18 DIAGNOSIS — Y92129 Unspecified place in nursing home as the place of occurrence of the external cause: Secondary | ICD-10-CM

## 2017-09-18 DIAGNOSIS — G2581 Restless legs syndrome: Secondary | ICD-10-CM | POA: Diagnosis present

## 2017-09-18 DIAGNOSIS — S72002A Fracture of unspecified part of neck of left femur, initial encounter for closed fracture: Secondary | ICD-10-CM

## 2017-09-18 DIAGNOSIS — F329 Major depressive disorder, single episode, unspecified: Secondary | ICD-10-CM | POA: Diagnosis present

## 2017-09-18 DIAGNOSIS — I110 Hypertensive heart disease with heart failure: Secondary | ICD-10-CM | POA: Diagnosis present

## 2017-09-18 DIAGNOSIS — Z888 Allergy status to other drugs, medicaments and biological substances status: Secondary | ICD-10-CM

## 2017-09-18 DIAGNOSIS — Z96649 Presence of unspecified artificial hip joint: Secondary | ICD-10-CM

## 2017-09-18 DIAGNOSIS — F17200 Nicotine dependence, unspecified, uncomplicated: Secondary | ICD-10-CM | POA: Diagnosis present

## 2017-09-18 DIAGNOSIS — Z9981 Dependence on supplemental oxygen: Secondary | ICD-10-CM

## 2017-09-18 DIAGNOSIS — J449 Chronic obstructive pulmonary disease, unspecified: Secondary | ICD-10-CM | POA: Diagnosis present

## 2017-09-18 DIAGNOSIS — Z66 Do not resuscitate: Secondary | ICD-10-CM | POA: Diagnosis present

## 2017-09-18 NOTE — ED Triage Notes (Signed)
[facility] [address] for unwitnessed [place]. No observed deformities, pt was c/o of pain everywhere but has hx of chronic pain. No bruising noted. Pt is not on bld thinners, has hx of AMS, combative at times.

## 2017-09-18 NOTE — ED Notes (Signed)
Bed: [facility] Expected date:  Expected time:  Means of arrival:  Comments: 

## 2017-09-19 ENCOUNTER — Inpatient Hospital Stay (HOSPITAL_COMMUNITY): Payer: Medicare Other

## 2017-09-19 ENCOUNTER — Emergency Department (HOSPITAL_COMMUNITY): Payer: Medicare Other

## 2017-09-19 ENCOUNTER — Other Ambulatory Visit: Payer: Self-pay

## 2017-09-19 ENCOUNTER — Emergency Department (HOSPITAL_COMMUNITY): Payer: Self-pay

## 2017-09-19 ENCOUNTER — Encounter (HOSPITAL_COMMUNITY): Payer: Self-pay | Admitting: Internal Medicine

## 2017-09-19 DIAGNOSIS — G2581 Restless legs syndrome: Secondary | ICD-10-CM | POA: Diagnosis present

## 2017-09-19 DIAGNOSIS — S72009A Fracture of unspecified part of neck of unspecified femur, initial encounter for closed fracture: Secondary | ICD-10-CM | POA: Diagnosis present

## 2017-09-19 DIAGNOSIS — S72002A Fracture of unspecified part of neck of left femur, initial encounter for closed fracture: Secondary | ICD-10-CM | POA: Diagnosis present

## 2017-09-19 DIAGNOSIS — N390 Urinary tract infection, site not specified: Secondary | ICD-10-CM | POA: Diagnosis present

## 2017-09-19 DIAGNOSIS — D649 Anemia, unspecified: Secondary | ICD-10-CM | POA: Diagnosis present

## 2017-09-19 DIAGNOSIS — S72012A Unspecified intracapsular fracture of left femur, initial encounter for closed fracture: Secondary | ICD-10-CM | POA: Diagnosis present

## 2017-09-19 DIAGNOSIS — F0390 Unspecified dementia without behavioral disturbance: Secondary | ICD-10-CM | POA: Diagnosis present

## 2017-09-19 DIAGNOSIS — J42 Unspecified chronic bronchitis: Secondary | ICD-10-CM | POA: Diagnosis not present

## 2017-09-19 DIAGNOSIS — Y92129 Unspecified place in nursing home as the place of occurrence of the external cause: Secondary | ICD-10-CM | POA: Diagnosis not present

## 2017-09-19 DIAGNOSIS — I456 Pre-excitation syndrome: Secondary | ICD-10-CM | POA: Diagnosis present

## 2017-09-19 DIAGNOSIS — M25552 Pain in left hip: Secondary | ICD-10-CM | POA: Diagnosis present

## 2017-09-19 DIAGNOSIS — E43 Unspecified severe protein-calorie malnutrition: Secondary | ICD-10-CM | POA: Diagnosis present

## 2017-09-19 DIAGNOSIS — I110 Hypertensive heart disease with heart failure: Secondary | ICD-10-CM | POA: Diagnosis present

## 2017-09-19 DIAGNOSIS — Z881 Allergy status to other antibiotic agents status: Secondary | ICD-10-CM | POA: Diagnosis not present

## 2017-09-19 DIAGNOSIS — Z7951 Long term (current) use of inhaled steroids: Secondary | ICD-10-CM | POA: Diagnosis not present

## 2017-09-19 DIAGNOSIS — F329 Major depressive disorder, single episode, unspecified: Secondary | ICD-10-CM | POA: Diagnosis present

## 2017-09-19 DIAGNOSIS — Z888 Allergy status to other drugs, medicaments and biological substances status: Secondary | ICD-10-CM | POA: Diagnosis not present

## 2017-09-19 DIAGNOSIS — Z7982 Long term (current) use of aspirin: Secondary | ICD-10-CM | POA: Diagnosis not present

## 2017-09-19 DIAGNOSIS — Z681 Body mass index (BMI) 19 or less, adult: Secondary | ICD-10-CM | POA: Diagnosis not present

## 2017-09-19 DIAGNOSIS — Z66 Do not resuscitate: Secondary | ICD-10-CM | POA: Diagnosis present

## 2017-09-19 DIAGNOSIS — Z9981 Dependence on supplemental oxygen: Secondary | ICD-10-CM | POA: Diagnosis not present

## 2017-09-19 DIAGNOSIS — J449 Chronic obstructive pulmonary disease, unspecified: Secondary | ICD-10-CM | POA: Diagnosis present

## 2017-09-19 DIAGNOSIS — I5042 Chronic combined systolic (congestive) and diastolic (congestive) heart failure: Secondary | ICD-10-CM

## 2017-09-19 DIAGNOSIS — F17200 Nicotine dependence, unspecified, uncomplicated: Secondary | ICD-10-CM | POA: Diagnosis present

## 2017-09-19 DIAGNOSIS — Z79891 Long term (current) use of opiate analgesic: Secondary | ICD-10-CM | POA: Diagnosis not present

## 2017-09-19 DIAGNOSIS — W1830XA Fall on same level, unspecified, initial encounter: Secondary | ICD-10-CM | POA: Diagnosis present

## 2017-09-19 LAB — BASIC METABOLIC PANEL
ANION GAP: 6 (ref 5–15)
Anion gap: 6 (ref 5–15)
BUN: 14 mg/dL (ref 6–20)
BUN: 11 mg/dL (ref 6–20)
CALCIUM: 8.5 mg/dL — AB (ref 8.9–10.3)
CALCIUM: 8.5 mg/dL — AB (ref 8.9–10.3)
CO2: 25 mmol/L (ref 22–32)
CO2: 28 mmol/L (ref 22–32)
CREATININE: 0.62 mg/dL (ref 0.44–1.00)
Chloride: 106 mmol/L (ref 101–111)
Chloride: 106 mmol/L (ref 101–111)
Creatinine, Ser: 0.55 mg/dL (ref 0.44–1.00)
GFR calc Af Amer: >60 mL/min (ref >60)
GFR calc Af Amer: >60 mL/min (ref >60)
GFR calc non Af Amer: >60 mL/min (ref >60)
GFR calc non Af Amer: >60 mL/min (ref >60)
GLUCOSE: 137 mg/dL — AB (ref 65–99)
GLUCOSE: 107 mg/dL — AB (ref 65–99)
Potassium: 3.6 mmol/L (ref 3.5–5.1)
Potassium: 3.7 mmol/L (ref 3.5–5.1)
Sodium: 137 mmol/L (ref 135–145)
Sodium: 140 mmol/L (ref 135–145)

## 2017-09-19 LAB — URINALYSIS, ROUTINE W REFLEX MICROSCOPIC
Bilirubin Urine: NEGATIVE
GLUCOSE, UA: NEGATIVE mg/dL
HGB URINE DIPSTICK: NEGATIVE
KETONES UR: NEGATIVE mg/dL
NITRITE: NEGATIVE
PH: 8 (ref 5.0–8.0)
Protein, ur: NEGATIVE mg/dL
Specific Gravity, Urine: 1.015 (ref 1.005–1.030)

## 2017-09-19 LAB — CBC WITH DIFFERENTIAL/PLATELET
BASOS PCT: 0 %
Basophils Absolute: 0 10*3/uL (ref 0.0–0.1)
Eosinophils Absolute: 0 10*3/uL (ref 0.0–0.7)
Eosinophils Relative: 1 %
HEMATOCRIT: 33.5 % — AB (ref 36.0–46.0)
Hemoglobin: 10.5 g/dL — ABNORMAL LOW (ref 12.0–15.0)
Lymphocytes Relative: 13 %
Lymphs Abs: 0.9 10*3/uL (ref 0.7–4.0)
MCH: 28.8 pg (ref 26.0–34.0)
MCHC: 31.3 g/dL (ref 30.0–36.0)
MCV: 92 fL (ref 78.0–100.0)
MONO ABS: 0.4 10*3/uL (ref 0.1–1.0)
MONOS PCT: 6 %
NEUTROS ABS: 5.7 10*3/uL (ref 1.7–7.7)
Neutrophils Relative %: 80 %
Platelets: 366 10*3/uL (ref 150–400)
RBC: 3.64 MIL/uL — ABNORMAL LOW (ref 3.87–5.11)
RDW: 14 % (ref 11.5–15.5)
WBC: 7.1 10*3/uL (ref 4.0–10.5)

## 2017-09-19 LAB — CBC
HEMATOCRIT: 33.5 % — AB (ref 36.0–46.0)
Hemoglobin: 10.4 g/dL — ABNORMAL LOW (ref 12.0–15.0)
MCH: 28.9 pg (ref 26.0–34.0)
MCHC: 31 g/dL (ref 30.0–36.0)
MCV: 93.1 fL (ref 78.0–100.0)
PLATELETS: 346 10*3/uL (ref 150–400)
RBC: 3.6 MIL/uL — ABNORMAL LOW (ref 3.87–5.11)
RDW: 14 % (ref 11.5–15.5)
WBC: 5.3 10*3/uL (ref 4.0–10.5)

## 2017-09-19 LAB — PROTIME-INR
INR: 1.15
Prothrombin Time: 14.6 seconds (ref 11.4–15.2)

## 2017-09-19 LAB — TYPE AND SCREEN
ABO/RH(D): B NEG
Antibody Screen: NEGATIVE

## 2017-09-19 LAB — ABO/RH: ABO/RH(D): B NEG

## 2017-09-19 LAB — SURGICAL PCR SCREEN
MRSA, PCR: NEGATIVE
Staphylococcus aureus: POSITIVE — AB

## 2017-09-19 LAB — APTT: APTT: 35 s (ref 24–36)

## 2017-09-19 MED ORDER — FENTANYL CITRATE (PF) 100 MCG/2ML IJ SOLN: 50.0000 ug | INTRAMUSCULAR | Status: DC | PRN

## 2017-09-19 MED ORDER — PRAMIPEXOLE DIHYDROCHLORIDE 0.25 MG PO TABS: 0.5000 mg | ORAL_TABLET | Freq: Two times a day (BID) | ORAL | Status: DC

## 2017-09-19 MED ORDER — HALOPERIDOL LACTATE 5 MG/ML IJ SOLN
1.0000 mg | Freq: Four times a day (QID) | INTRAMUSCULAR | Status: DC | PRN
  Administered 2017-09-19 – 2017-09-20 (×2): 2 mg via INTRAVENOUS
  Administered 2017-09-23: 1 mg via INTRAVENOUS
  Filled 2017-09-19 (×4): qty 1

## 2017-09-19 MED ORDER — MORPHINE SULFATE (PF) 2 MG/ML IV SOLN: 1.0000 mg | INTRAVENOUS | Status: DC | PRN

## 2017-09-19 MED ORDER — LORAZEPAM 0.5 MG PO TABS
0.5000 mg | ORAL_TABLET | Freq: Two times a day (BID) | ORAL | Status: DC
  Administered 2017-09-19 – 2017-09-22 (×8): 0.5 mg via ORAL
  Filled 2017-09-19 (×9): qty 1

## 2017-09-19 MED ORDER — QUETIAPINE FUMARATE 100 MG PO TABS: 100.0000 mg | ORAL_TABLET | Freq: Every day | ORAL | Status: DC

## 2017-09-19 MED ORDER — MORPHINE SULFATE (PF) 4 MG/ML IV SOLN
1.0000 mg | INTRAVENOUS | Status: DC | PRN
  Administered 2017-09-19 – 2017-09-21 (×4): 2 mg via INTRAVENOUS
  Filled 2017-09-19 (×6): qty 1

## 2017-09-19 MED ORDER — MAGNESIUM OXIDE 400 (241.3 MG) MG PO TABS
400.0000 mg | ORAL_TABLET | Freq: Every day | ORAL | Status: DC
  Administered 2017-09-19 – 2017-09-22 (×4): 400 mg via ORAL
  Filled 2017-09-19 (×6): qty 1

## 2017-09-19 MED ORDER — FENTANYL 25 MCG/HR TD PT72
25.0000 ug | MEDICATED_PATCH | TRANSDERMAL | Status: DC
  Administered 2017-09-19 – 2017-09-22 (×2): 25 ug via TRANSDERMAL
  Filled 2017-09-19 (×2): qty 1

## 2017-09-19 MED ORDER — TIOTROPIUM BROMIDE MONOHYDRATE 18 MCG IN CAPS
18.0000 ug | ORAL_CAPSULE | Freq: Every day | RESPIRATORY_TRACT | Status: DC
  Administered 2017-09-21 – 2017-09-22 (×2): 18 ug via RESPIRATORY_TRACT
  Filled 2017-09-19 (×2): qty 5

## 2017-09-19 MED ORDER — VANCOMYCIN HCL IN DEXTROSE 1-5 GM/200ML-% IV SOLN
1000.0000 mg | INTRAVENOUS | Status: AC
  Administered 2017-09-20: 1000 mg via INTRAVENOUS
  Filled 2017-09-19: qty 200

## 2017-09-19 MED ORDER — ACETAMINOPHEN 650 MG RE SUPP
650.0000 mg | RECTAL | Status: DC | PRN
  Administered 2017-09-19: 650 mg via RECTAL
  Filled 2017-09-19: qty 1

## 2017-09-19 MED ORDER — LACTATED RINGERS IV SOLN
INTRAVENOUS | Status: DC
  Administered 2017-09-19 – 2017-09-21 (×3): via INTRAVENOUS

## 2017-09-19 MED ORDER — LOSARTAN POTASSIUM 50 MG PO TABS
50.0000 mg | ORAL_TABLET | Freq: Every day | ORAL | Status: DC
  Administered 2017-09-19 – 2017-09-20 (×2): 50 mg via ORAL
  Filled 2017-09-19 (×2): qty 1

## 2017-09-19 MED ORDER — FLUTICASONE FUROATE-VILANTEROL 200-25 MCG/INH IN AEPB
1.0000 | INHALATION_SPRAY | Freq: Every day | RESPIRATORY_TRACT | Status: DC
  Administered 2017-09-20 – 2017-09-22 (×3): 1 via RESPIRATORY_TRACT
  Filled 2017-09-19 (×2): qty 28

## 2017-09-19 MED ORDER — SERTRALINE HCL 25 MG PO TABS
25.0000 mg | ORAL_TABLET | Freq: Every day | ORAL | Status: DC
  Administered 2017-09-19 – 2017-09-22 (×4): 25 mg via ORAL
  Filled 2017-09-19 (×4): qty 1

## 2017-09-19 MED ORDER — QUETIAPINE FUMARATE 100 MG PO TABS
100.0000 mg | ORAL_TABLET | ORAL | Status: DC
  Administered 2017-09-19 – 2017-09-21 (×2): 100 mg via ORAL
  Filled 2017-09-19 (×4): qty 1

## 2017-09-19 MED ORDER — PANTOPRAZOLE SODIUM 40 MG PO TBEC
40.0000 mg | DELAYED_RELEASE_TABLET | Freq: Two times a day (BID) | ORAL | Status: DC
  Administered 2017-09-19 – 2017-09-22 (×8): 40 mg via ORAL
  Filled 2017-09-19 (×9): qty 1

## 2017-09-19 MED ORDER — NITROGLYCERIN 0.4 MG SL SUBL: 0.4000 mg | SUBLINGUAL_TABLET | SUBLINGUAL | Status: DC | PRN

## 2017-09-19 MED ORDER — PRAMIPEXOLE DIHYDROCHLORIDE 0.25 MG PO TABS
0.5000 mg | ORAL_TABLET | Freq: Every day | ORAL | Status: DC | PRN
  Filled 2017-09-19: qty 2

## 2017-09-19 MED ORDER — METHOCARBAMOL 500 MG PO TABS: 250.0000 mg | ORAL_TABLET | Freq: Every evening | ORAL | Status: DC | PRN

## 2017-09-19 MED ORDER — DOXYCYCLINE HYCLATE 100 MG IV SOLR
100.0000 mg | Freq: Two times a day (BID) | INTRAVENOUS | Status: DC
  Administered 2017-09-19 – 2017-09-21 (×4): 100 mg via INTRAVENOUS
  Filled 2017-09-19 (×5): qty 100

## 2017-09-19 MED ORDER — POLYETHYLENE GLYCOL 3350 17 G PO PACK: 17.0000 g | PACK | Freq: Every day | ORAL | Status: DC | PRN

## 2017-09-19 MED ORDER — PRAMIPEXOLE DIHYDROCHLORIDE 1 MG PO TABS
1.0000 mg | ORAL_TABLET | Freq: Every day | ORAL | Status: DC
  Administered 2017-09-19 – 2017-09-22 (×4): 1 mg via ORAL
  Filled 2017-09-19 (×5): qty 1

## 2017-09-19 MED ORDER — ACETAMINOPHEN 325 MG PO TABS: 650.0000 mg | ORAL_TABLET | Freq: Four times a day (QID) | ORAL | Status: DC | PRN

## 2017-09-19 MED ORDER — MORPHINE SULFATE (PF) 2 MG/ML IV SOLN: 0.5000 mg | INTRAVENOUS | Status: DC | PRN

## 2017-09-19 MED ORDER — HYDROCODONE-ACETAMINOPHEN 5-325 MG PO TABS
1.0000 | ORAL_TABLET | Freq: Four times a day (QID) | ORAL | Status: DC | PRN
  Administered 2017-09-19 – 2017-09-21 (×5): 1 via ORAL
  Administered 2017-09-23: 2 via ORAL
  Filled 2017-09-19 (×2): qty 1
  Filled 2017-09-19 (×2): qty 2
  Filled 2017-09-19: qty 1
  Filled 2017-09-19: qty 2
  Filled 2017-09-19 (×3): qty 1

## 2017-09-19 NOTE — ED Provider Notes (Signed)
[facility]-EMERGENCY DEPT Provider Note   CSN: [id number] Arrival date & time: 09/18/17  2324     History   Chief Complaint Chief Complaint  Patient presents with  . Fall    HPI [name] is a 84 y.o. female.  HPI Level 5 caveat for severe dementia 84 year old female with history of dementia comes in from the nursing home with chief complaint of fall.  Patient had an unwitnessed fall prior to ED arrival.  Patient reports pain everywhere, but she also has history of chronic pain.  Patient is not on any blood thinners.  Past Medical History:  Diagnosis Date  . Abnormal cardiovascular stress test 01/17/2013   Decreased LV function with mild distal anterior perfusion abnormality   . Acute combined systolic and diastolic heart failure (HCC) 01/17/2013  . Chronic combined systolic and diastolic CHF (congestive heart failure) (HCC) 08/17/2017  . COPD (chronic obstructive pulmonary disease) (HCC)   . Hypertension   . Restless leg syndrome   . Right lower lobe pneumonia (HCC) 08/17/2017  . Sepsis (HCC) 08/17/2017  . Tobacco use disorder   . WPW (Wolff-Parkinson-White syndrome)     Patient Active Problem List   Diagnosis Date Noted  . Pneumococcal pneumonia (HCC) 08/26/2017  . Hypertension 08/26/2017  . COPD (chronic obstructive pulmonary disease) (HCC) 08/26/2017  . Psychosis (HCC) 08/26/2017  . Coronary artery disease 08/26/2017  . Sepsis (HCC) 08/17/2017  . Chronic combined systolic and diastolic CHF (congestive heart failure) (HCC) 08/17/2017  . Right lower lobe pneumonia (HCC) 08/17/2017  . Acute combined systolic and diastolic heart failure (HCC) 01/17/2013  . Abnormal cardiovascular stress test 01/17/2013    Class: Acute  . Tobacco use disorder   . WPW (Wolff-Parkinson-White syndrome)     Past Surgical History:  Procedure Laterality Date  . APPENDECTOMY    . BACK SURGERY     MULTIPLE  . HEMANGIOMA EXCISION    . KIDNEY SURGERY     RT  KIDNEY TACK  . NECK SURGERY    . TONSILLECTOMY      OB History    No data available       Home Medications    Prior to Admission medications   Medication Sig Start Date End Date Taking? Authorizing Provider  acetaminophen (TYLENOL) 500 MG tablet Take 500 mg by mouth every 6 (six) hours as needed for moderate pain.   Yes [provider]  Aclidinium Bromide (TUDORZA PRESSAIR) 400 MCG/ACT AEPB Inhale 1 puff into the lungs every other day.   Yes [provider]  albuterol (PROVENTIL) (2.5 MG/3ML) 0.083% nebulizer solution Take 2.5 mg by nebulization 3 (three) times daily as needed for wheezing or shortness of breath.    Yes [provider]  aspirin EC 81 MG EC tablet Take 1 tablet (81 mg total) by mouth daily. 12/22/12  Yes Corky CraftsVaranasi, Jayadeep S, MD  B Complex Vitamins (B COMPLEX-B12) TABS Take 1 tablet by mouth daily.   Yes [provider]  fentaNYL (DURAGESIC - DOSED MCG/HR) 25 MCG/HR patch Place 1 patch (25 mcg total) onto the skin every 3 (three) days. 08/21/17  Yes Alison Murrayevine, Alma M, MD  fluticasone furoate-vilanterol (BREO ELLIPTA) 200-25 MCG/INH AEPB Inhale 1 puff into the lungs at bedtime.   Yes [provider]  LORazepam (ATIVAN) 1 MG tablet Take 0.5 tablets (0.5 mg total) by mouth 2 (two) times daily. 08/21/17  Yes Alison Murrayevine, Alma M, MD  losartan (COZAAR) 50 MG tablet Take 50 mg by  mouth daily. 08/09/17  Yes [provider]  Magnesium 250 MG TABS Take 1 tablet by mouth daily.   Yes [provider]  methocarbamol (ROBAXIN) 500 MG tablet Take 250 mg by mouth at bedtime as needed for spasms. 06/12/17  Yes [provider]  nitroGLYCERIN (NITROSTAT) 0.4 MG SL tablet Place 1 tablet (0.4 mg total) under the tongue every 5 (five) minutes x 3 doses as needed for chest pain. 12/22/12  Yes Corky Crafts, MD  pantoprazole (PROTONIX) 40 MG tablet Take 40 mg by mouth 2 (two) times daily.   Yes [provider]    polyethylene glycol (MIRALAX / GLYCOLAX) packet Take 17 g by mouth daily as needed for mild constipation.   Yes [provider]  pramipexole (MIRAPEX) 1 MG tablet Take 0.5-1 mg by mouth 2 (two) times daily. 0.5 mg during the day prn and 1 mg at bedtime   Yes [provider]  QUEtiapine (SEROQUEL) 100 MG tablet Take 1 tablet (100 mg total) by mouth at bedtime. Only gives 100 mg four times a week Patient taking differently: Take 100 mg by mouth at bedtime. Tuesday, Thursday, Saturday and Sunday 08/21/17  Yes Alison Murray, MD  sertraline (ZOLOFT) 25 MG tablet Take 1 tablet (25 mg total) by mouth at bedtime. 08/21/17  Yes Alison Murray, MD  traMADol (ULTRAM) 50 MG tablet Take 50 mg by mouth every 6 (six) hours as needed for moderate pain.   Yes [provider]  haloperidol (HALDOL) 0.5 MG tablet Take 1 tablet (0.5 mg total) by mouth every 6 (six) hours as needed for agitation. Patient taking differently: Take 0.5 mg by mouth every 6 (six) hours as needed for agitation. Start 09/08/2017 08/21/17   Alison Murray, MD  OXYGEN Inhale into the lungs. CONTINUE PRN OXYGEN AT 1 LITERS/NASAL CANNULA FOR OXYGEN SATURATION 87% OR BELOW    [provider]  vitamin B-12 100 MCG tablet Take 1 tablet (100 mcg total) by mouth daily. Patient not taking: Reported on 09/19/2017 08/23/17   Alison Murray, MD    Family History No family history on file.  Social History Social History   Tobacco Use  . Smoking status: Current Every Day Smoker  . Smokeless tobacco: Never Used  Substance Use Topics  . Alcohol use: No  . Drug use: No     Allergies   Amoxicillin; Ciprofloxacin; Lorabid [loracarbef]; Avelox [moxifloxacin]; Erythromycin; Inderal [propranolol]; and Chantix [varenicline]   Review of Systems Review of Systems  Unable to perform ROS: Dementia     Physical Exam Updated Vital Signs BP 136/71 (BP Location: Right Arm)   Pulse (!) 102   Temp 98.9 F (37.2 C)  (Oral)   Resp 16   SpO2 100%   Physical Exam  Constitutional: She is oriented to person, place, and time. She appears well-developed.  HENT:  Head: Normocephalic and atraumatic.  Eyes: EOM are normal.  Neck: Normal range of motion. Neck supple.  Cardiovascular: Normal rate.  Pulmonary/Chest: Effort normal.  Abdominal: Bowel sounds are normal.  Musculoskeletal:  Patient is noted to have tenderness over the left hip.  OTHERWISE:  Head to toe evaluation shows no hematoma, bleeding of the scalp, no facial abrasions, no spine step offs, crepitus of the chest or neck, no tenderness to palpation of the bilateral upper and lower extremities, no gross deformities, no chest tenderness, no pelvic pain.   Neurological: She is alert and oriented to person, place, and time.  Skin:  Skin is warm and dry.  Nursing note and vitals reviewed.    ED Treatments / Results  Labs (all labs ordered are listed, but only abnormal results are displayed) Labs Reviewed  BASIC METABOLIC PANEL - Abnormal; Notable for the following components:      Result Value   Glucose, Bld 137 (*)    Calcium 8.5 (*)    All other components within normal limits  CBC WITH DIFFERENTIAL/PLATELET - Abnormal; Notable for the following components:   RBC 3.64 (*)    Hemoglobin 10.5 (*)    HCT 33.5 (*)    All other components within normal limits  PROTIME-INR  APTT  URINALYSIS, ROUTINE W REFLEX MICROSCOPIC  TYPE AND SCREEN    EKG  EKG Interpretation  Date/Time:  Tuesday September 19 2017 00:54:35 EST Ventricular Rate:  108 PR Interval:  170 QRS Duration: 82 QT Interval:  382 QTC Calculation: 511 R Axis:   53 Text Interpretation:  Sinus tachycardia Left ventricular hypertrophy with repolarization abnormality Abnormal ECG No acute changes No significant change since last tracing Confirmed by Derwood Kaplan ([id number]) on 09/19/2017 2:51:52 AM       Radiology Ct Head Wo Contrast  Result Date: 09/19/2017 CLINICAL DATA:   Fall.  Currently on anticoagulation treatment. EXAM: CT HEAD WITHOUT CONTRAST CT CERVICAL SPINE WITHOUT CONTRAST TECHNIQUE: Multidetector CT imaging of the head and cervical spine was performed following the standard protocol without intravenous contrast. Multiplanar CT image reconstructions of the cervical spine were also generated. COMPARISON:  Head CT 08/17/2017 FINDINGS: CT HEAD FINDINGS Brain: No mass lesion, intraparenchymal hemorrhage or extra-axial collection. No evidence of acute cortical infarct. There is periventricular hypoattenuation compatible with chronic microvascular disease. Vascular: No hyperdense vessel or unexpected calcification. Skull: Normal visualized skull base, calvarium and extracranial soft tissues. Sinuses/Orbits: No sinus fluid levels or advanced mucosal thickening. No mastoid effusion. Normal orbits. CT CERVICAL SPINE FINDINGS Alignment: There is grade 1 anterolisthesis at C7-T1, likely degenerative. Skull base and vertebrae: No acute fracture. There is C4-C7 ACDF without hardware abnormality. Soft tissues and spinal canal: No prevertebral fluid or swelling. No visible canal hematoma. Disc levels: No advanced spinal canal or neural foraminal stenosis. Bilateral C3-C4 facet fusion. Severe right C7-T1 foraminal stenosis. Upper chest: No pneumothorax, pulmonary nodule or pleural effusion. Other: Normal visualized paraspinal cervical soft tissues. IMPRESSION: 1. Chronic ischemic microangiopathy without acute intracranial abnormality. 2. No acute fracture of the cervical spine. 3. Status post C4-C7 ACDF with grade 1 C7-T1 anterolisthesis. Electronically Signed   By: Deatra Robinson M.D.   On: 09/19/2017 00:36   Ct Cervical Spine Wo Contrast  Result Date: 09/19/2017 CLINICAL DATA:  Fall.  Currently on anticoagulation treatment. EXAM: CT HEAD WITHOUT CONTRAST CT CERVICAL SPINE WITHOUT CONTRAST TECHNIQUE: Multidetector CT imaging of the head and cervical spine was performed following the  standard protocol without intravenous contrast. Multiplanar CT image reconstructions of the cervical spine were also generated. COMPARISON:  Head CT 08/17/2017 FINDINGS: CT HEAD FINDINGS Brain: No mass lesion, intraparenchymal hemorrhage or extra-axial collection. No evidence of acute cortical infarct. There is periventricular hypoattenuation compatible with chronic microvascular disease. Vascular: No hyperdense vessel or unexpected calcification. Skull: Normal visualized skull base, calvarium and extracranial soft tissues. Sinuses/Orbits: No sinus fluid levels or advanced mucosal thickening. No mastoid effusion. Normal orbits. CT CERVICAL SPINE FINDINGS Alignment: There is grade 1 anterolisthesis at C7-T1, likely degenerative. Skull base and vertebrae: No acute fracture. There is C4-C7 ACDF without hardware abnormality. Soft tissues and spinal canal: No prevertebral  fluid or swelling. No visible canal hematoma. Disc levels: No advanced spinal canal or neural foraminal stenosis. Bilateral C3-C4 facet fusion. Severe right C7-T1 foraminal stenosis. Upper chest: No pneumothorax, pulmonary nodule or pleural effusion. Other: Normal visualized paraspinal cervical soft tissues. IMPRESSION: 1. Chronic ischemic microangiopathy without acute intracranial abnormality. 2. No acute fracture of the cervical spine. 3. Status post C4-C7 ACDF with grade 1 C7-T1 anterolisthesis. Electronically Signed   By: Deatra Robinson M.D.   On: 09/19/2017 00:36   Dg Hip Unilat W Or Wo Pelvis 2-3 Views Left  Result Date: 09/19/2017 CLINICAL DATA:  Initial evaluation for acute trauma, fall. EXAM: DG HIP (WITH OR WITHOUT PELVIS) 2-3V LEFT COMPARISON:  None. FINDINGS: There is an acute fracture subcapital fracture extending through the left femoral neck with slight superior subluxation. Femoral head remains normally aligned within the acetabulum. Limited views of the right hip demonstrate no acute abnormality. Bony pelvis intact. Advanced  osteopenia. No acute soft tissue abnormality. Prominent aorto bi-iliac atherosclerotic disease noted. IMPRESSION: Acute mildly displaced subcapital fracture of the left femoral neck. Electronically Signed   By: Rise Mu M.D.   On: 09/19/2017 00:30    Procedures Procedures (including critical care time)  Medications Ordered in ED Medications  lactated ringers infusion ( Intravenous New Bag/Given 09/19/17 0121)  fentaNYL (SUBLIMAZE) injection 50 mcg (not administered)     Initial Impression / Assessment and Plan / ED Course  I have reviewed the triage vital signs and the nursing notes.  Pertinent labs & imaging results that were available during my care of the patient were reviewed by me and considered in my medical decision making (see chart for details).     DDx includes: - Mechanical falls - ICH - Fractures - Contusions - Soft tissue injury  Pt comes in after a fall and has a femoral neck fracture. Rest of the the imaging is reassuring. Dr. Despina Hick, Orthopedics will complete perform Surgery on Wednesday. Final Clinical Impressions(s) / ED Diagnoses   Final diagnoses:  Fall, initial encounter  Closed fracture of neck of left femur, initial encounter [facility])    ED Discharge Orders    None       Derwood Kaplan, MD 09/19/17 [phone]

## 2017-09-19 NOTE — H&P (Signed)
History and Physical    [name]:[id number] DOB: [birth year] DOA: 09/18/2017  PCP: [name], MD  Patient coming from: Skilled nursing facility.  Chief Complaint: Fall.  HPI: [name] is a 84 y.o. female with history of dementia, chronic combined CHF COPD and depression was brought to the ER after patient had an unwitnessed fall at the nursing facility.  No further information is available.  ED Course: In the ER patient is complaining of hip pain and x-rays revealed left hip fracture and Dr. Berton Lan on-call orthopedic surgeon was consulted.  Patient is only complaining of generalized body ache but is not in any respiratory distress.  UA showing features possibility of UTI.  Review of Systems: As per HPI, rest all negative.   Past Medical History:  Diagnosis Date  . Abnormal cardiovascular stress test 01/17/2013   Decreased LV function with mild distal anterior perfusion abnormality   . Acute combined systolic and diastolic heart failure (HCC) 01/17/2013  . Chronic combined systolic and diastolic CHF (congestive heart failure) (HCC) 08/17/2017  . COPD (chronic obstructive pulmonary disease) (HCC)   . Hypertension   . Restless leg syndrome   . Right lower lobe pneumonia (HCC) 08/17/2017  . Sepsis (HCC) 08/17/2017  . Tobacco use disorder   . WPW (Wolff-Parkinson-White syndrome)     Past Surgical History:  Procedure Laterality Date  . APPENDECTOMY    . BACK SURGERY     MULTIPLE  . HEMANGIOMA EXCISION    . KIDNEY SURGERY     RT KIDNEY TACK  . NECK SURGERY    . TONSILLECTOMY       reports that she has been smoking.  she has never used smokeless tobacco. She reports that she does not drink alcohol or use drugs.  Allergies  Allergen Reactions  . Amoxicillin Anaphylaxis and Rash  . Ciprofloxacin Anaphylaxis and Other (See Comments)    Thrush  . Lorabid [Loracarbef] Anaphylaxis and Rash  . Avelox [Moxifloxacin] Other (See Comments)    Tendon and joint pain   . Erythromycin Other (See Comments)    angioedema  . Inderal [Propranolol] Other (See Comments)    Vasculitis  . Chantix [Varenicline] Nausea Only    History reviewed. No pertinent family history.  Prior to Admission medications   Medication Sig Start Date End Date Taking? Authorizing Provider  acetaminophen (TYLENOL) 500 MG tablet Take 500 mg by mouth every 6 (six) hours as needed for moderate pain.   Yes [provider]  Aclidinium Bromide (TUDORZA PRESSAIR) 400 MCG/ACT AEPB Inhale 1 puff into the lungs every other day.   Yes [provider]  albuterol (PROVENTIL) (2.5 MG/3ML) 0.083% nebulizer solution Take 2.5 mg by nebulization 3 (three) times daily as needed for wheezing or shortness of breath.    Yes [provider]  aspirin EC 81 MG EC tablet Take 1 tablet (81 mg total) by mouth daily. 12/22/12  Yes Corky Crafts, MD  B Complex Vitamins (B COMPLEX-B12) TABS Take 1 tablet by mouth daily.   Yes [provider]  fentaNYL (DURAGESIC - DOSED MCG/HR) 25 MCG/HR patch Place 1 patch (25 mcg total) onto the skin every 3 (three) days. 08/21/17  Yes Alison Murray, MD  fluticasone furoate-vilanterol (BREO ELLIPTA) 200-25 MCG/INH AEPB Inhale 1 puff into the lungs at bedtime.   Yes [provider]  LORazepam (ATIVAN) 1 MG tablet Take 0.5 tablets (0.5 mg total) by mouth 2 (two) times daily. 08/21/17  Yes [name]  M, MD  losartan (COZAAR) 50 MG tablet Take 50 mg by mouth daily. 08/09/17  Yes [provider]  Magnesium 250 MG TABS Take 1 tablet by mouth daily.   Yes [provider]  methocarbamol (ROBAXIN) 500 MG tablet Take 250 mg by mouth at bedtime as needed for spasms. 06/12/17  Yes [provider]  nitroGLYCERIN (NITROSTAT) 0.4 MG SL tablet Place 1 tablet (0.4 mg total) under the tongue every 5 (five) minutes x 3 doses as needed for chest pain. 12/22/12  Yes Corky Crafts, MD  pantoprazole (PROTONIX) 40 MG  tablet Take 40 mg by mouth 2 (two) times daily.   Yes [provider]  polyethylene glycol (MIRALAX / GLYCOLAX) packet Take 17 g by mouth daily as needed for mild constipation.   Yes [provider]  pramipexole (MIRAPEX) 1 MG tablet Take 0.5-1 mg by mouth 2 (two) times daily. 0.5 mg during the day prn and 1 mg at bedtime   Yes [provider]  QUEtiapine (SEROQUEL) 100 MG tablet Take 1 tablet (100 mg total) by mouth at bedtime. Only gives 100 mg four times a week Patient taking differently: Take 100 mg by mouth at bedtime. Tuesday, Thursday, Saturday and Sunday 08/21/17  Yes Alison Murray, MD  sertraline (ZOLOFT) 25 MG tablet Take 1 tablet (25 mg total) by mouth at bedtime. 08/21/17  Yes Alison Murray, MD  traMADol (ULTRAM) 50 MG tablet Take 50 mg by mouth every 6 (six) hours as needed for moderate pain.   Yes [provider]  haloperidol (HALDOL) 0.5 MG tablet Take 1 tablet (0.5 mg total) by mouth every 6 (six) hours as needed for agitation. Patient taking differently: Take 0.5 mg by mouth every 6 (six) hours as needed for agitation. Start 09/08/2017 08/21/17   Alison Murray, MD  OXYGEN Inhale into the lungs. CONTINUE PRN OXYGEN AT 1 LITERS/NASAL CANNULA FOR OXYGEN SATURATION 87% OR BELOW    [provider]  vitamin B-12 100 MCG tablet Take 1 tablet (100 mcg total) by mouth daily. Patient not taking: Reported on 09/19/2017 08/23/17   Alison Murray, MD    Physical Exam: Vitals:   09/18/17 2326 09/18/17 2330 09/19/17 0133  BP:  (!) 142/74 136/71  Pulse:  99 (!) 102  Resp:  18 16  Temp:  98.9 F (37.2 C)   TempSrc:  Oral   SpO2: 96% 92% 100%      Constitutional: Moderately built and nourished. Vitals:   09/18/17 2326 09/18/17 2330 09/19/17 0133  BP:  (!) 142/74 136/71  Pulse:  99 (!) 102  Resp:  18 16  Temp:  98.9 F (37.2 C)   TempSrc:  Oral   SpO2: 96% 92% 100%   Eyes: Anicteric no pallor. ENMT: No discharge from the ears eyes  nose or mouth. Neck: No neck rigidity no mass felt. Respiratory: No rhonchi or crepitations. Cardiovascular: S1-S2 heard no murmurs appreciated. Abdomen: Soft nontender bowel sounds present. Musculoskeletal: Left hip pain on movement. Skin: No rash. Neurologic: Alert awake oriented to her name.  Moves all extremities. Psychiatric: Has dementia.   Labs on Admission: I have personally reviewed following labs and imaging studies  CBC: Recent Labs  Lab 09/19/17 0113  WBC 7.1  NEUTROABS 5.7  HGB 10.5*  HCT 33.5*  MCV 92.0  PLT 366   Basic Metabolic Panel: Recent Labs  Lab 09/19/17 0113  NA 137  K 3.6  CL 106  CO2 25  GLUCOSE  137*  BUN 14  CREATININE 0.55  CALCIUM 8.5*   GFR: Estimated Creatinine Clearance: 35.8 mL/min (by C-G formula based on SCr of 0.55 mg/dL). Liver Function Tests: No results for input(s): AST, ALT, ALKPHOS, BILITOT, PROT, ALBUMIN in the last 168 hours. No results for input(s): LIPASE, AMYLASE in the last 168 hours. No results for input(s): AMMONIA in the last 168 hours. Coagulation Profile: Recent Labs  Lab 09/19/17 0113  INR 1.15   Cardiac Enzymes: No results for input(s): CKTOTAL, CKMB, CKMBINDEX, TROPONINI in the last 168 hours. BNP (last 3 results) No results for input(s): PROBNP in the last 8760 hours. HbA1C: No results for input(s): HGBA1C in the last 72 hours. CBG: No results for input(s): GLUCAP in the last 168 hours. Lipid Profile: No results for input(s): CHOL, HDL, LDLCALC, TRIG, CHOLHDL, LDLDIRECT in the last 72 hours. Thyroid Function Tests: No results for input(s): TSH, T4TOTAL, FREET4, T3FREE, THYROIDAB in the last 72 hours. Anemia Panel: No results for input(s): VITAMINB12, FOLATE, FERRITIN, TIBC, IRON, RETICCTPCT in the last 72 hours. Urine analysis:    Component Value Date/Time   COLORURINE YELLOW 08/17/2017 1237   APPEARANCEUR HAZY (A) 08/17/2017 1237   LABSPEC 1.023 08/17/2017 1237   PHURINE 5.0 08/17/2017 1237    GLUCOSEU NEGATIVE 08/17/2017 1237   HGBUR NEGATIVE 08/17/2017 1237   BILIRUBINUR NEGATIVE 08/17/2017 1237   KETONESUR NEGATIVE 08/17/2017 1237   PROTEINUR 30 (A) 08/17/2017 1237   UROBILINOGEN 0.2 04/25/2014 1827   NITRITE NEGATIVE 08/17/2017 1237   LEUKOCYTESUR NEGATIVE 08/17/2017 1237   Sepsis Labs: @LABRCNTIP (procalcitonin:4,lacticidven:4) )No results found for this or any previous visit (from the past 240 hour(s)).   Radiological Exams on Admission: Ct Head Wo Contrast  Result Date: 09/19/2017 CLINICAL DATA:  Fall.  Currently on anticoagulation treatment. EXAM: CT HEAD WITHOUT CONTRAST CT CERVICAL SPINE WITHOUT CONTRAST TECHNIQUE: Multidetector CT imaging of the head and cervical spine was performed following the standard protocol without intravenous contrast. Multiplanar CT image reconstructions of the cervical spine were also generated. COMPARISON:  Head CT 08/17/2017 FINDINGS: CT HEAD FINDINGS Brain: No mass lesion, intraparenchymal hemorrhage or extra-axial collection. No evidence of acute cortical infarct. There is periventricular hypoattenuation compatible with chronic microvascular disease. Vascular: No hyperdense vessel or unexpected calcification. Skull: Normal visualized skull base, calvarium and extracranial soft tissues. Sinuses/Orbits: No sinus fluid levels or advanced mucosal thickening. No mastoid effusion. Normal orbits. CT CERVICAL SPINE FINDINGS Alignment: There is grade 1 anterolisthesis at C7-T1, likely degenerative. Skull base and vertebrae: No acute fracture. There is C4-C7 ACDF without hardware abnormality. Soft tissues and spinal canal: No prevertebral fluid or swelling. No visible canal hematoma. Disc levels: No advanced spinal canal or neural foraminal stenosis. Bilateral C3-C4 facet fusion. Severe right C7-T1 foraminal stenosis. Upper chest: No pneumothorax, pulmonary nodule or pleural effusion. Other: Normal visualized paraspinal cervical soft tissues. IMPRESSION: 1.  Chronic ischemic microangiopathy without acute intracranial abnormality. 2. No acute fracture of the cervical spine. 3. Status post C4-C7 ACDF with grade 1 C7-T1 anterolisthesis. Electronically Signed   By: Deatra Robinson M.D.   On: 09/19/2017 00:36   Ct Cervical Spine Wo Contrast  Result Date: 09/19/2017 CLINICAL DATA:  Fall.  Currently on anticoagulation treatment. EXAM: CT HEAD WITHOUT CONTRAST CT CERVICAL SPINE WITHOUT CONTRAST TECHNIQUE: Multidetector CT imaging of the head and cervical spine was performed following the standard protocol without intravenous contrast. Multiplanar CT image reconstructions of the cervical spine were also generated. COMPARISON:  Head CT 08/17/2017 FINDINGS: CT HEAD FINDINGS Brain: No  mass lesion, intraparenchymal hemorrhage or extra-axial collection. No evidence of acute cortical infarct. There is periventricular hypoattenuation compatible with chronic microvascular disease. Vascular: No hyperdense vessel or unexpected calcification. Skull: Normal visualized skull base, calvarium and extracranial soft tissues. Sinuses/Orbits: No sinus fluid levels or advanced mucosal thickening. No mastoid effusion. Normal orbits. CT CERVICAL SPINE FINDINGS Alignment: There is grade 1 anterolisthesis at C7-T1, likely degenerative. Skull base and vertebrae: No acute fracture. There is C4-C7 ACDF without hardware abnormality. Soft tissues and spinal canal: No prevertebral fluid or swelling. No visible canal hematoma. Disc levels: No advanced spinal canal or neural foraminal stenosis. Bilateral C3-C4 facet fusion. Severe right C7-T1 foraminal stenosis. Upper chest: No pneumothorax, pulmonary nodule or pleural effusion. Other: Normal visualized paraspinal cervical soft tissues. IMPRESSION: 1. Chronic ischemic microangiopathy without acute intracranial abnormality. 2. No acute fracture of the cervical spine. 3. Status post C4-C7 ACDF with grade 1 C7-T1 anterolisthesis. Electronically Signed   By:  Deatra RobinsonKevin  Herman M.D.   On: 09/19/2017 00:36   Dg Hip Unilat W Or Wo Pelvis 2-3 Views Left  Result Date: 09/19/2017 CLINICAL DATA:  Initial evaluation for acute trauma, fall. EXAM: DG HIP (WITH OR WITHOUT PELVIS) 2-3V LEFT COMPARISON:  None. FINDINGS: There is an acute fracture subcapital fracture extending through the left femoral neck with slight superior subluxation. Femoral head remains normally aligned within the acetabulum. Limited views of the right hip demonstrate no acute abnormality. Bony pelvis intact. Advanced osteopenia. No acute soft tissue abnormality. Prominent aorto bi-iliac atherosclerotic disease noted. IMPRESSION: Acute mildly displaced subcapital fracture of the left femoral neck. Electronically Signed   By: Rise MuBenjamin  McClintock M.D.   On: 09/19/2017 00:30    EKG: Independently reviewed.  Normal sinus rhythm.  Assessment/Plan Principal Problem:   Closed left hip fracture, initial encounter (HCC) Active Problems:   WPW (Wolff-Parkinson-White syndrome)   Chronic combined systolic and diastolic CHF (congestive heart failure) (HCC)   Hypertension   COPD (chronic obstructive pulmonary disease) (HCC)   Hip fracture (HCC)    1. Closed hip fracture status post fall -CT head and C-spine were unremarkable.  Orthopedic surgeon Dr. Berton LanAllusio has been consulted and is planning to have surgery done on September 20, 2017.  Patient is at moderate risk for intermediate risk procedure. 2. Chronic combined systolic and diastolic CHF last EF measured was in around 2014 was around 40-45%.  Patient appears compensated.  Patient is on ARB. 3. COPD -not actively wheezing.  Continue inhalers. 4. Anemia appears to be chronic.  Follow CBC. 5. History of dementia and depression on Seroquel and Zoloft. 6. History of WPW syndrome status post ablation. 7. Possible UTI on doxycycline. 8. Recent admission for pneumonia.   DVT prophylaxis: SCDs in anticipation of surgery. Code Status: DNR. Family  Communication: No family at the bedside. Disposition Plan: Home. Consults called: Orthopedics. Admission status: Inpatient.   Eduard ClosArshad N Shelly Shoultz MD [facility] Pager [phone].  If 7PM-7AM, please contact night-coverage [email or web address] Password [email or web address]  09/19/2017, 4:22 AM

## 2017-09-19 NOTE — H&P (View-Only) (Signed)
Reason for Consult: Left sided pain following a fall  Referring Physician: Rise Patience MD       [facility]  [name] is an 84 y.o. female.  HPI: Patient is an 84 year old female seen in the ED today following evaluation after a fall and complaints of pain.  She was residing at [facility] recovering from a recent Pneumonia and hip pain. She was brought into the ED last evening where radiographs proved positive for a displaced subcapital fracture of the left femoral neck. Her daughter is at the bedside and much of the recent history was obtained from the family. She developed a pneumonia this past December requiring admission and was in the hospital for several days.  She was transferred to [facility] for recovery but while she was there, developed severe hip pain and was transported to the hospital for evaluation to rule out a possible hip fracture at that time. She had outpatient films apparently and was thought to have a left femoral head fracture.  The ED notes on 1/2 stated the reports were not sent so she underwent CT scan at that time. 08/30/2017 EXAM: CT OF THE LEFT HIP WITHOUT CONTRAST IMPRESSION: 1. No hip fracture. Irregularity along the lateral subcapital femoral neck is degenerative. She was then discharged back to the SNF.  She was transferred to [facility] where she has been the last several days as per the family.  The daughter states that she was contacted by the facility a couple of times last evening, the last one to notify her the her mother was found in the floor last night and was complaining of pain.  She was brought to [facility] ED where radiographs were positive for a displaced subcapital fracture of the left femoral neck. She has been seen by [facility] and will be admitted for surgery.  She has extensive medical history.  She will be placed at bedrest allowing her to be optimized for surgery which will tentatively be scheduled for tomorrow (Wednesday) by Dr.  Wynelle Link. Allow her to eat today and then NPO after liquid breakfast tomorrow.  Past Medical History:  Diagnosis Date  . Abnormal cardiovascular stress test 01/17/2013   Decreased LV function with mild distal anterior perfusion abnormality   . Acute combined systolic and diastolic heart failure ([place]) 01/17/2013  . Chronic combined systolic and diastolic CHF (congestive heart failure) ([place]) 08/17/2017  . COPD (chronic obstructive pulmonary disease) ([place])   . Hypertension   . Restless leg syndrome   . Right lower lobe pneumonia ([place]) 08/17/2017  . Sepsis ([place]) 08/17/2017  . Tobacco use disorder   . WPW (Wolff-Parkinson-White syndrome)     Past Surgical History:  Procedure Laterality Date  . APPENDECTOMY    . BACK SURGERY     MULTIPLE  . HEMANGIOMA EXCISION    . KIDNEY SURGERY     RT KIDNEY TACK  . NECK SURGERY    . TONSILLECTOMY      History reviewed. No pertinent family history.  Social History:  reports that she has been smoking.  she has never used smokeless tobacco. She reports that she does not drink alcohol or use drugs.  Allergies:  Allergies  Allergen Reactions  . Amoxicillin Anaphylaxis and Rash  . Ciprofloxacin Anaphylaxis and Other (See Comments)    Thrush  . Lorabid [Loracarbef] Anaphylaxis and Rash  . Avelox [Moxifloxacin] Other (See Comments)    Tendon and joint pain  . Erythromycin Other (See Comments)  angioedema  . Inderal [Propranolol] Other (See Comments)    Vasculitis  . Chantix [Varenicline] Nausea Only    Medications Listed at Home  acetaminophen (TYLENOL) 500 MG tablet Take 500 mg by mouth every 6 (six) hours as needed for moderate pain. Rise Patience, MD Not Ordered  Aclidinium Bromide (TUDORZA PRESSAIR) 400 MCG/ACT AEPB Inhale 1 puff into the lungs every other day. Rise Patience, MD Reordered  Ordered as: tiotropium [facility]) inhalation capsule 18 mcg - 18 mcg, Inhalation, Daily, First dose on Tue 09/19/17 at 1000  albuterol  (PROVENTIL) (2.5 MG/3ML) 0.083% nebulizer solution Take 2.5 mg by nebulization 3 (three) times daily as needed for wheezing or shortness of breath.  Rise Patience, MD Not Ordered  aspirin EC 81 MG EC tablet Take 1 tablet (81 mg total) by mouth daily. Rise Patience, MD Not Ordered  B Complex Vitamins (B COMPLEX-B12) TABS Take 1 tablet by mouth daily. Rise Patience, MD Not Ordered  fentaNYL (DURAGESIC - DOSED MCG/HR) 25 MCG/HR patch Place 1 patch (25 mcg total) onto the skin every 3 (three) days. Rise Patience, MD Reordered  Ordered as: fentaNYL (DURAGESIC - dosed mcg/hr) patch 25 mcg - 25 mcg, Transdermal, Administer over 72 Hours, every 72 hours, First dose on Tue 09/19/17 at 1000  fluticasone furoate-vilanterol (BREO ELLIPTA) 200-25 MCG/INH AEPB Inhale 1 puff into the lungs at bedtime. Rise Patience, MD Reordered  Ordered as: fluticasone furoate-vilanterol (BREO ELLIPTA) 200-25 MCG/INH 1 puff - 1 puff, Inhalation, Daily at bedtime, First dose on Tue 09/19/17 at 2200  LORazepam (ATIVAN) 1 MG tablet Take 0.5 tablets (0.5 mg total) by mouth 2 (two) times daily. Rise Patience, MD Reordered  Ordered as: LORazepam (ATIVAN) tablet 0.5 mg - 0.5 mg, Oral, 2 times daily, First dose on Tue 09/19/17 at 1000  losartan (COZAAR) 50 MG tablet Take 50 mg by mouth daily. Rise Patience, MD Reordered  Ordered as: losartan (COZAAR) tablet 50 mg - 50 mg, Oral, Daily, First dose on Tue 09/19/17 at 1000  Magnesium 250 MG TABS Take 1 tablet by mouth daily. Rise Patience, MD Reordered  Ordered as: magnesium oxide (MAG-OX) tablet 400 mg - 400 mg, Oral, Daily, First dose on Tue 09/19/17 at 1000  methocarbamol (ROBAXIN) 500 MG tablet Take 250 mg by mouth at bedtime as needed for spasms. Rise Patience, MD Reordered  Ordered as: methocarbamol (ROBAXIN) tablet 250 mg - 250 mg, Oral, At bedtime PRN, muscle spasms, Starting Tue 09/19/17 at 0417  nitroGLYCERIN (NITROSTAT) 0.4 MG  SL tablet Place 1 tablet (0.4 mg total) under the tongue every 5 (five) minutes x 3 doses as needed for chest pain. Rise Patience, MD Reordered  Ordered as: nitroGLYCERIN (NITROSTAT) SL tablet 0.4 mg - 0.4 mg, Sublingual, Every 5 min x3 PRN, chest pain, Starting Tue 09/19/17 at 0417  pantoprazole (PROTONIX) 40 MG tablet Take 40 mg by mouth 2 (two) times daily. Rise Patience, MD Reordered  Ordered as: pantoprazole (PROTONIX) EC tablet 40 mg - 40 mg, Oral, 2 times daily, First dose on Tue 09/19/17 at 1000  polyethylene glycol (MIRALAX / GLYCOLAX) packet Take 17 g by mouth daily as needed for mild constipation. Rise Patience, MD Reordered  Ordered as: polyethylene glycol (MIRALAX / GLYCOLAX) packet 17 g - Use for Miralax or Glycolax 17 g, Oral, Daily PRN, mild constipation, Starting Tue 09/19/17 at 0417  pramipexole (MIRAPEX) 1 MG tablet Take 0.5-1 mg by mouth 2 (two)  times daily. 0.5 mg during the day prn and 1 mg at bedtime Rise Patience, MD Not Ordered  Ordered as: pramipexole (MIRAPEX) tablet 0.5-1 mg - 0.5-1 mg, Oral, 2 times daily, First dose on Tue 09/19/17 at 1000 (Discontinued)  QUEtiapine (SEROQUEL) 100 MG tablet Take 1 tablet (100 mg total) by mouth at bedtime. Only gives 100 mg four times a week Rise Patience, MD Not Ordered   Patient taking differently: Take 100 mg by mouth at bedtime. Tuesday, Thursday, Saturday and Sunday    Ordered as: QUEtiapine (SEROQUEL) tablet 100 mg - 100 mg, Oral, Daily at bedtime, First dose on Tue 09/19/17 at 2200 (Discontinued)  sertraline (ZOLOFT) 25 MG tablet Take 1 tablet (25 mg total) by mouth at bedtime. Rise Patience, MD Reordered  Ordered as: sertraline (ZOLOFT) tablet 25 mg - 25 mg, Oral, Daily at bedtime, First dose on Tue 09/19/17 at 2200  traMADol (ULTRAM) 50 MG tablet Take 50 mg by mouth every 6 (six) hours as needed for moderate pain. Rise Patience, MD Not Ordered  haloperidol (HALDOL) 0.5 MG tablet Take 1  tablet (0.5 mg total) by mouth every 6 (six) hours as needed for agitation. Rise Patience, MD Not Ordered   Patient taking differently: Take 0.5 mg by mouth every 6 (six) hours as needed for agitation. Start 09/08/2017    OXYGEN Inhale into the lungs. CONTINUE PRN OXYGEN AT 1 LITERS/NASAL CANNULA FOR OXYGEN SATURATION 87% OR BELOW Rise Patience, MD Not Ordered  vitamin B-12 100 MCG tablet Take 1 tablet (100 mcg total) by mouth daily. Rise Patience, MD Not Ordered   Patient not taking: Reported on 09/19/2017       Results for orders placed or performed during the hospital encounter of 09/18/17 (from the past 48 hour(s))  Basic metabolic panel     Status: Abnormal   Collection Time: 09/19/17  1:13 AM  Result Value Ref Range   Sodium 137 135 - 145 mmol/L   Potassium 3.6 3.5 - 5.1 mmol/L   Chloride 106 101 - 111 mmol/L   CO2 25 22 - 32 mmol/L   Glucose, Bld 137 (H) 65 - 99 mg/dL   BUN 14 6 - 20 mg/dL   Creatinine, Ser 0.55 0.44 - 1.00 mg/dL   Calcium 8.5 (L) 8.9 - 10.3 mg/dL   GFR calc non Af Amer >60 >60 mL/min   GFR calc Af Amer >60 >60 mL/min    Comment: (NOTE) The eGFR has been calculated using the CKD EPI equation. This calculation has not been validated in all clinical situations. eGFR's persistently <60 mL/min signify possible Chronic Kidney Disease.    Anion gap 6 5 - 15  CBC WITH DIFFERENTIAL     Status: Abnormal   Collection Time: 09/19/17  1:13 AM  Result Value Ref Range   WBC 7.1 4.0 - 10.5 K/uL   RBC 3.64 (L) 3.87 - 5.11 MIL/uL   Hemoglobin 10.5 (L) 12.0 - 15.0 g/dL   HCT 33.5 (L) 36.0 - 46.0 %   MCV 92.0 78.0 - 100.0 fL   MCH 28.8 26.0 - 34.0 pg   MCHC 31.3 30.0 - 36.0 g/dL   RDW 14.0 11.5 - 15.5 %   Platelets 366 150 - 400 K/uL   Neutrophils Relative % 80 %   Neutro Abs 5.7 1.7 - 7.7 K/uL   Lymphocytes Relative 13 %   Lymphs Abs 0.9 0.7 - 4.0 K/uL   Monocytes Relative 6 %  Monocytes Absolute 0.4 0.1 - 1.0 K/uL   Eosinophils Relative 1 %    Eosinophils Absolute 0.0 0.0 - 0.7 K/uL   Basophils Relative 0 %   Basophils Absolute 0.0 0.0 - 0.1 K/uL  Protime-INR     Status: None   Collection Time: 09/19/17  1:13 AM  Result Value Ref Range   Prothrombin Time 14.6 11.4 - 15.2 seconds   INR 1.15   APTT     Status: None   Collection Time: 09/19/17  1:13 AM  Result Value Ref Range   aPTT 35 24 - 36 seconds  Type and screen [place]     Status: None   Collection Time: 09/19/17  1:18 AM  Result Value Ref Range   ABO/RH(D) B NEG    Antibody Screen NEG    Sample Expiration 09/22/2017   ABO/Rh     Status: None   Collection Time: 09/19/17  1:18 AM  Result Value Ref Range   ABO/RH(D) B NEG   Urinalysis, Routine w reflex microscopic     Status: Abnormal   Collection Time: 09/19/17  4:24 AM  Result Value Ref Range   Color, Urine YELLOW YELLOW   APPearance HAZY (A) CLEAR   Specific Gravity, Urine 1.015 1.005 - 1.030   pH 8.0 5.0 - 8.0   Glucose, UA NEGATIVE NEGATIVE mg/dL   Hgb urine dipstick NEGATIVE NEGATIVE   Bilirubin Urine NEGATIVE NEGATIVE   Ketones, ur NEGATIVE NEGATIVE mg/dL   Protein, ur NEGATIVE NEGATIVE mg/dL   Nitrite NEGATIVE NEGATIVE   Leukocytes, UA TRACE (A) NEGATIVE   RBC / HPF 0-5 0 - 5 RBC/hpf   WBC, UA 6-30 0 - 5 WBC/hpf   Bacteria, UA RARE (A) NONE SEEN   Squamous Epithelial / LPF 6-30 (A) NONE SEEN   Mucus PRESENT   CBC     Status: Abnormal   Collection Time: 09/19/17  5:35 AM  Result Value Ref Range   WBC 5.3 4.0 - 10.5 K/uL   RBC 3.60 (L) 3.87 - 5.11 MIL/uL   Hemoglobin 10.4 (L) 12.0 - 15.0 g/dL   HCT 33.5 (L) 36.0 - 46.0 %   MCV 93.1 78.0 - 100.0 fL   MCH 28.9 26.0 - 34.0 pg   MCHC 31.0 30.0 - 36.0 g/dL   RDW 14.0 11.5 - 15.5 %   Platelets 346 150 - 400 K/uL  Basic metabolic panel     Status: Abnormal   Collection Time: 09/19/17  5:35 AM  Result Value Ref Range   Sodium 140 135 - 145 mmol/L   Potassium 3.7 3.5 - 5.1 mmol/L   Chloride 106 101 - 111 mmol/L   CO2 28 22 -  32 mmol/L   Glucose, Bld 107 (H) 65 - 99 mg/dL   BUN 11 6 - 20 mg/dL   Creatinine, Ser 0.62 0.44 - 1.00 mg/dL   Calcium 8.5 (L) 8.9 - 10.3 mg/dL   GFR calc non Af Amer >60 >60 mL/min   GFR calc Af Amer >60 >60 mL/min    Comment: (NOTE) The eGFR has been calculated using the CKD EPI equation. This calculation has not been validated in all clinical situations. eGFR's persistently <60 mL/min signify possible Chronic Kidney Disease.    Anion gap 6 5 - 15    Ct Head Wo Contrast  Result Date: 09/19/2017 CLINICAL DATA:  Fall.  Currently on anticoagulation treatment. EXAM: CT HEAD WITHOUT CONTRAST CT CERVICAL SPINE WITHOUT CONTRAST TECHNIQUE: Multidetector CT imaging of  the head and cervical spine was performed following the standard protocol without intravenous contrast. Multiplanar CT image reconstructions of the cervical spine were also generated. COMPARISON:  Head CT 08/17/2017 FINDINGS: CT HEAD FINDINGS Brain: No mass lesion, intraparenchymal hemorrhage or extra-axial collection. No evidence of acute cortical infarct. There is periventricular hypoattenuation compatible with chronic microvascular disease. Vascular: No hyperdense vessel or unexpected calcification. Skull: Normal visualized skull base, calvarium and extracranial soft tissues. Sinuses/Orbits: No sinus fluid levels or advanced mucosal thickening. No mastoid effusion. Normal orbits. CT CERVICAL SPINE FINDINGS Alignment: There is grade 1 anterolisthesis at C7-T1, likely degenerative. Skull base and vertebrae: No acute fracture. There is C4-C7 ACDF without hardware abnormality. Soft tissues and spinal canal: No prevertebral fluid or swelling. No visible canal hematoma. Disc levels: No advanced spinal canal or neural foraminal stenosis. Bilateral C3-C4 facet fusion. Severe right C7-T1 foraminal stenosis. Upper chest: No pneumothorax, pulmonary nodule or pleural effusion. Other: Normal visualized paraspinal cervical soft tissues. IMPRESSION: 1.  Chronic ischemic microangiopathy without acute intracranial abnormality. 2. No acute fracture of the cervical spine. 3. Status post C4-C7 ACDF with grade 1 C7-T1 anterolisthesis. Electronically Signed   By: Ulyses Jarred M.D.   On: 09/19/2017 00:36   Ct Cervical Spine Wo Contrast  Result Date: 09/19/2017 CLINICAL DATA:  Fall.  Currently on anticoagulation treatment. EXAM: CT HEAD WITHOUT CONTRAST CT CERVICAL SPINE WITHOUT CONTRAST TECHNIQUE: Multidetector CT imaging of the head and cervical spine was performed following the standard protocol without intravenous contrast. Multiplanar CT image reconstructions of the cervical spine were also generated. COMPARISON:  Head CT 08/17/2017 FINDINGS: CT HEAD FINDINGS Brain: No mass lesion, intraparenchymal hemorrhage or extra-axial collection. No evidence of acute cortical infarct. There is periventricular hypoattenuation compatible with chronic microvascular disease. Vascular: No hyperdense vessel or unexpected calcification. Skull: Normal visualized skull base, calvarium and extracranial soft tissues. Sinuses/Orbits: No sinus fluid levels or advanced mucosal thickening. No mastoid effusion. Normal orbits. CT CERVICAL SPINE FINDINGS Alignment: There is grade 1 anterolisthesis at C7-T1, likely degenerative. Skull base and vertebrae: No acute fracture. There is C4-C7 ACDF without hardware abnormality. Soft tissues and spinal canal: No prevertebral fluid or swelling. No visible canal hematoma. Disc levels: No advanced spinal canal or neural foraminal stenosis. Bilateral C3-C4 facet fusion. Severe right C7-T1 foraminal stenosis. Upper chest: No pneumothorax, pulmonary nodule or pleural effusion. Other: Normal visualized paraspinal cervical soft tissues. IMPRESSION: 1. Chronic ischemic microangiopathy without acute intracranial abnormality. 2. No acute fracture of the cervical spine. 3. Status post C4-C7 ACDF with grade 1 C7-T1 anterolisthesis. Electronically Signed   By:  Ulyses Jarred M.D.   On: 09/19/2017 00:36   Dg Hip Unilat W Or Wo Pelvis 2-3 Views Left  Result Date: 09/19/2017 CLINICAL DATA:  Initial evaluation for acute trauma, fall. EXAM: DG HIP (WITH OR WITHOUT PELVIS) 2-3V LEFT COMPARISON:  None. FINDINGS: There is an acute fracture subcapital fracture extending through the left femoral neck with slight superior subluxation. Femoral head remains normally aligned within the acetabulum. Limited views of the right hip demonstrate no acute abnormality. Bony pelvis intact. Advanced osteopenia. No acute soft tissue abnormality. Prominent aorto bi-iliac atherosclerotic disease noted. IMPRESSION: Acute mildly displaced subcapital fracture of the left femoral neck. Electronically Signed   By: Jeannine Boga M.D.   On: 09/19/2017 00:30    ROS Blood pressure (!) 146/96, pulse 94, temperature 98.9 F (37.2 C), temperature source Oral, resp. rate 16, SpO2 91 %. Physical Exam  84 year old female, alert, poor historian, pleasant  at time of evaluation.  Accompanied by daughter [name].  Left lower extremity - left leg is flexed and externally rotated for position of comfort. Motor - patient is able to move foot and toes slowly on exam. Trace DP and PT pulse and Pop pulse is noted on exam. Moderate pain is noted on any attempt of internal/external hip roll.  EXAM: DG HIP (WITH OR WITHOUT PELVIS) 2-3V LEFT IMPRESSION: Acute mildly displaced subcapital fracture of the left femoral neck.  Assessment/Plan: Mildly Displaced Left Femoral Neck Fracture  Admit per Medicine.  Bedrest.  Allow diet today and then clear liquids tomorrow morning, and then NPO after breakfast tomorrow. Consent.    09/19/2017, 10:01 AM

## 2017-09-19 NOTE — Consult Note (Signed)
Reason for Consult: Left sided pain following a fall  Referring Physician: Rise Patience MD       [facility]  [name] is an 84 y.o. female.  HPI: Patient is an 84 year old female seen in the ED today following evaluation after a fall and complaints of pain.  She was residing at [facility] recovering from a recent Pneumonia and hip pain. She was brought into the ED last evening where radiographs proved positive for a displaced subcapital fracture of the left femoral neck. Her daughter is at the bedside and much of the recent history was obtained from the family. She developed a pneumonia this past December requiring admission and was in the hospital for several days.  She was transferred to [facility] for recovery but while she was there, developed severe hip pain and was transported to the hospital for evaluation to rule out a possible hip fracture at that time. She had outpatient films apparently and was thought to have a left femoral head fracture.  The ED notes on 1/2 stated the reports were not sent so she underwent CT scan at that time. 08/30/2017 EXAM: CT OF THE LEFT HIP WITHOUT CONTRAST IMPRESSION: 1. No hip fracture. Irregularity along the lateral subcapital femoral neck is degenerative. She was then discharged back to the SNF.  She was transferred to [facility] where she has been the last several days as per the family.  The daughter states that she was contacted by the facility a couple of times last evening, the last one to notify her the her mother was found in the floor last night and was complaining of pain.  She was brought to [facility] ED where radiographs were positive for a displaced subcapital fracture of the left femoral neck. She has been seen by [facility] and will be admitted for surgery.  She has extensive medical history.  She will be placed at bedrest allowing her to be optimized for surgery which will tentatively be scheduled for tomorrow (Wednesday) by Dr.  Wynelle Link. Allow her to eat today and then NPO after liquid breakfast tomorrow.  Past Medical History:  Diagnosis Date  . Abnormal cardiovascular stress test 01/17/2013   Decreased LV function with mild distal anterior perfusion abnormality   . Acute combined systolic and diastolic heart failure ([place]) 01/17/2013  . Chronic combined systolic and diastolic CHF (congestive heart failure) ([place]) 08/17/2017  . COPD (chronic obstructive pulmonary disease) ([place])   . Hypertension   . Restless leg syndrome   . Right lower lobe pneumonia ([place]) 08/17/2017  . Sepsis ([place]) 08/17/2017  . Tobacco use disorder   . WPW (Wolff-Parkinson-White syndrome)     Past Surgical History:  Procedure Laterality Date  . APPENDECTOMY    . BACK SURGERY     MULTIPLE  . HEMANGIOMA EXCISION    . KIDNEY SURGERY     RT KIDNEY TACK  . NECK SURGERY    . TONSILLECTOMY      History reviewed. No pertinent family history.  Social History:  reports that she has been smoking.  she has never used smokeless tobacco. She reports that she does not drink alcohol or use drugs.  Allergies:  Allergies  Allergen Reactions  . Amoxicillin Anaphylaxis and Rash  . Ciprofloxacin Anaphylaxis and Other (See Comments)    Thrush  . Lorabid [Loracarbef] Anaphylaxis and Rash  . Avelox [Moxifloxacin] Other (See Comments)    Tendon and joint pain  . Erythromycin Other (See Comments)  angioedema  . Inderal [Propranolol] Other (See Comments)    Vasculitis  . Chantix [Varenicline] Nausea Only    Medications Listed at Home  acetaminophen (TYLENOL) 500 MG tablet Take 500 mg by mouth every 6 (six) hours as needed for moderate pain. Rise Patience, MD Not Ordered  Aclidinium Bromide (TUDORZA PRESSAIR) 400 MCG/ACT AEPB Inhale 1 puff into the lungs every other day. Rise Patience, MD Reordered  Ordered as: tiotropium [facility]) inhalation capsule 18 mcg - 18 mcg, Inhalation, Daily, First dose on Tue 09/19/17 at 1000  albuterol  (PROVENTIL) (2.5 MG/3ML) 0.083% nebulizer solution Take 2.5 mg by nebulization 3 (three) times daily as needed for wheezing or shortness of breath.  Rise Patience, MD Not Ordered  aspirin EC 81 MG EC tablet Take 1 tablet (81 mg total) by mouth daily. Rise Patience, MD Not Ordered  B Complex Vitamins (B COMPLEX-B12) TABS Take 1 tablet by mouth daily. Rise Patience, MD Not Ordered  fentaNYL (DURAGESIC - DOSED MCG/HR) 25 MCG/HR patch Place 1 patch (25 mcg total) onto the skin every 3 (three) days. Rise Patience, MD Reordered  Ordered as: fentaNYL (DURAGESIC - dosed mcg/hr) patch 25 mcg - 25 mcg, Transdermal, Administer over 72 Hours, every 72 hours, First dose on Tue 09/19/17 at 1000  fluticasone furoate-vilanterol (BREO ELLIPTA) 200-25 MCG/INH AEPB Inhale 1 puff into the lungs at bedtime. Rise Patience, MD Reordered  Ordered as: fluticasone furoate-vilanterol (BREO ELLIPTA) 200-25 MCG/INH 1 puff - 1 puff, Inhalation, Daily at bedtime, First dose on Tue 09/19/17 at 2200  LORazepam (ATIVAN) 1 MG tablet Take 0.5 tablets (0.5 mg total) by mouth 2 (two) times daily. Rise Patience, MD Reordered  Ordered as: LORazepam (ATIVAN) tablet 0.5 mg - 0.5 mg, Oral, 2 times daily, First dose on Tue 09/19/17 at 1000  losartan (COZAAR) 50 MG tablet Take 50 mg by mouth daily. Rise Patience, MD Reordered  Ordered as: losartan (COZAAR) tablet 50 mg - 50 mg, Oral, Daily, First dose on Tue 09/19/17 at 1000  Magnesium 250 MG TABS Take 1 tablet by mouth daily. Rise Patience, MD Reordered  Ordered as: magnesium oxide (MAG-OX) tablet 400 mg - 400 mg, Oral, Daily, First dose on Tue 09/19/17 at 1000  methocarbamol (ROBAXIN) 500 MG tablet Take 250 mg by mouth at bedtime as needed for spasms. Rise Patience, MD Reordered  Ordered as: methocarbamol (ROBAXIN) tablet 250 mg - 250 mg, Oral, At bedtime PRN, muscle spasms, Starting Tue 09/19/17 at 0417  nitroGLYCERIN (NITROSTAT) 0.4 MG  SL tablet Place 1 tablet (0.4 mg total) under the tongue every 5 (five) minutes x 3 doses as needed for chest pain. Rise Patience, MD Reordered  Ordered as: nitroGLYCERIN (NITROSTAT) SL tablet 0.4 mg - 0.4 mg, Sublingual, Every 5 min x3 PRN, chest pain, Starting Tue 09/19/17 at 0417  pantoprazole (PROTONIX) 40 MG tablet Take 40 mg by mouth 2 (two) times daily. Rise Patience, MD Reordered  Ordered as: pantoprazole (PROTONIX) EC tablet 40 mg - 40 mg, Oral, 2 times daily, First dose on Tue 09/19/17 at 1000  polyethylene glycol (MIRALAX / GLYCOLAX) packet Take 17 g by mouth daily as needed for mild constipation. Rise Patience, MD Reordered  Ordered as: polyethylene glycol (MIRALAX / GLYCOLAX) packet 17 g - Use for Miralax or Glycolax 17 g, Oral, Daily PRN, mild constipation, Starting Tue 09/19/17 at 0417  pramipexole (MIRAPEX) 1 MG tablet Take 0.5-1 mg by mouth 2 (two)  times daily. 0.5 mg during the day prn and 1 mg at bedtime Kakrakandy, Arshad N, MD Not Ordered  Ordered as: pramipexole (MIRAPEX) tablet 0.5-1 mg - 0.5-1 mg, Oral, 2 times daily, First dose on Tue 09/19/17 at 1000 (Discontinued)  QUEtiapine (SEROQUEL) 100 MG tablet Take 1 tablet (100 mg total) by mouth at bedtime. Only gives 100 mg four times a week Kakrakandy, Arshad N, MD Not Ordered   Patient taking differently: Take 100 mg by mouth at bedtime. Tuesday, Thursday, Saturday and Sunday    Ordered as: QUEtiapine (SEROQUEL) tablet 100 mg - 100 mg, Oral, Daily at bedtime, First dose on Tue 09/19/17 at 2200 (Discontinued)  sertraline (ZOLOFT) 25 MG tablet Take 1 tablet (25 mg total) by mouth at bedtime. Kakrakandy, Arshad N, MD Reordered  Ordered as: sertraline (ZOLOFT) tablet 25 mg - 25 mg, Oral, Daily at bedtime, First dose on Tue 09/19/17 at 2200  traMADol (ULTRAM) 50 MG tablet Take 50 mg by mouth every 6 (six) hours as needed for moderate pain. Kakrakandy, Arshad N, MD Not Ordered  haloperidol (HALDOL) 0.5 MG tablet Take 1  tablet (0.5 mg total) by mouth every 6 (six) hours as needed for agitation. Kakrakandy, Arshad N, MD Not Ordered   Patient taking differently: Take 0.5 mg by mouth every 6 (six) hours as needed for agitation. Start 09/08/2017    OXYGEN Inhale into the lungs. CONTINUE PRN OXYGEN AT 1 LITERS/NASAL CANNULA FOR OXYGEN SATURATION 87% OR BELOW Kakrakandy, Arshad N, MD Not Ordered  vitamin B-12 100 MCG tablet Take 1 tablet (100 mcg total) by mouth daily. Kakrakandy, Arshad N, MD Not Ordered   Patient not taking: Reported on 09/19/2017       Results for orders placed or performed during the hospital encounter of 09/18/17 (from the past 48 hour(s))  Basic metabolic panel     Status: Abnormal   Collection Time: 09/19/17  1:13 AM  Result Value Ref Range   Sodium 137 135 - 145 mmol/L   Potassium 3.6 3.5 - 5.1 mmol/L   Chloride 106 101 - 111 mmol/L   CO2 25 22 - 32 mmol/L   Glucose, Bld 137 (H) 65 - 99 mg/dL   BUN 14 6 - 20 mg/dL   Creatinine, Ser 0.55 0.44 - 1.00 mg/dL   Calcium 8.5 (L) 8.9 - 10.3 mg/dL   GFR calc non Af Amer >60 >60 mL/min   GFR calc Af Amer >60 >60 mL/min    Comment: (NOTE) The eGFR has been calculated using the CKD EPI equation. This calculation has not been validated in all clinical situations. eGFR's persistently <60 mL/min signify possible Chronic Kidney Disease.    Anion gap 6 5 - 15  CBC WITH DIFFERENTIAL     Status: Abnormal   Collection Time: 09/19/17  1:13 AM  Result Value Ref Range   WBC 7.1 4.0 - 10.5 K/uL   RBC 3.64 (L) 3.87 - 5.11 MIL/uL   Hemoglobin 10.5 (L) 12.0 - 15.0 g/dL   HCT 33.5 (L) 36.0 - 46.0 %   MCV 92.0 78.0 - 100.0 fL   MCH 28.8 26.0 - 34.0 pg   MCHC 31.3 30.0 - 36.0 g/dL   RDW 14.0 11.5 - 15.5 %   Platelets 366 150 - 400 K/uL   Neutrophils Relative % 80 %   Neutro Abs 5.7 1.7 - 7.7 K/uL   Lymphocytes Relative 13 %   Lymphs Abs 0.9 0.7 - 4.0 K/uL   Monocytes Relative 6 %     Monocytes Absolute 0.4 0.1 - 1.0 K/uL   Eosinophils Relative 1 %    Eosinophils Absolute 0.0 0.0 - 0.7 K/uL   Basophils Relative 0 %   Basophils Absolute 0.0 0.0 - 0.1 K/uL  Protime-INR     Status: None   Collection Time: 09/19/17  1:13 AM  Result Value Ref Range   Prothrombin Time 14.6 11.4 - 15.2 seconds   INR 1.15   APTT     Status: None   Collection Time: 09/19/17  1:13 AM  Result Value Ref Range   aPTT 35 24 - 36 seconds  Type and screen [facility]     Status: None   Collection Time: 09/19/17  1:18 AM  Result Value Ref Range   ABO/RH(D) B NEG    Antibody Screen NEG    Sample Expiration 09/22/2017   ABO/Rh     Status: None   Collection Time: 09/19/17  1:18 AM  Result Value Ref Range   ABO/RH(D) B NEG   Urinalysis, Routine w reflex microscopic     Status: Abnormal   Collection Time: 09/19/17  4:24 AM  Result Value Ref Range   Color, Urine YELLOW YELLOW   APPearance HAZY (A) CLEAR   Specific Gravity, Urine 1.015 1.005 - 1.030   pH 8.0 5.0 - 8.0   Glucose, UA NEGATIVE NEGATIVE mg/dL   Hgb urine dipstick NEGATIVE NEGATIVE   Bilirubin Urine NEGATIVE NEGATIVE   Ketones, ur NEGATIVE NEGATIVE mg/dL   Protein, ur NEGATIVE NEGATIVE mg/dL   Nitrite NEGATIVE NEGATIVE   Leukocytes, UA TRACE (A) NEGATIVE   RBC / HPF 0-5 0 - 5 RBC/hpf   WBC, UA 6-30 0 - 5 WBC/hpf   Bacteria, UA RARE (A) NONE SEEN   Squamous Epithelial / LPF 6-30 (A) NONE SEEN   Mucus PRESENT   CBC     Status: Abnormal   Collection Time: 09/19/17  5:35 AM  Result Value Ref Range   WBC 5.3 4.0 - 10.5 K/uL   RBC 3.60 (L) 3.87 - 5.11 MIL/uL   Hemoglobin 10.4 (L) 12.0 - 15.0 g/dL   HCT 33.5 (L) 36.0 - 46.0 %   MCV 93.1 78.0 - 100.0 fL   MCH 28.9 26.0 - 34.0 pg   MCHC 31.0 30.0 - 36.0 g/dL   RDW 14.0 11.5 - 15.5 %   Platelets 346 150 - 400 K/uL  Basic metabolic panel     Status: Abnormal   Collection Time: 09/19/17  5:35 AM  Result Value Ref Range   Sodium 140 135 - 145 mmol/L   Potassium 3.7 3.5 - 5.1 mmol/L   Chloride 106 101 - 111 mmol/L   CO2 28 22 -  32 mmol/L   Glucose, Bld 107 (H) 65 - 99 mg/dL   BUN 11 6 - 20 mg/dL   Creatinine, Ser 0.62 0.44 - 1.00 mg/dL   Calcium 8.5 (L) 8.9 - 10.3 mg/dL   GFR calc non Af Amer >60 >60 mL/min   GFR calc Af Amer >60 >60 mL/min    Comment: (NOTE) The eGFR has been calculated using the CKD EPI equation. This calculation has not been validated in all clinical situations. eGFR's persistently <60 mL/min signify possible Chronic Kidney Disease.    Anion gap 6 5 - 15    Ct Head Wo Contrast  Result Date: 09/19/2017 CLINICAL DATA:  Fall.  Currently on anticoagulation treatment. EXAM: CT HEAD WITHOUT CONTRAST CT CERVICAL SPINE WITHOUT CONTRAST TECHNIQUE: Multidetector CT imaging of   the head and cervical spine was performed following the standard protocol without intravenous contrast. Multiplanar CT image reconstructions of the cervical spine were also generated. COMPARISON:  Head CT 08/17/2017 FINDINGS: CT HEAD FINDINGS Brain: No mass lesion, intraparenchymal hemorrhage or extra-axial collection. No evidence of acute cortical infarct. There is periventricular hypoattenuation compatible with chronic microvascular disease. Vascular: No hyperdense vessel or unexpected calcification. Skull: Normal visualized skull base, calvarium and extracranial soft tissues. Sinuses/Orbits: No sinus fluid levels or advanced mucosal thickening. No mastoid effusion. Normal orbits. CT CERVICAL SPINE FINDINGS Alignment: There is grade 1 anterolisthesis at C7-T1, likely degenerative. Skull base and vertebrae: No acute fracture. There is C4-C7 ACDF without hardware abnormality. Soft tissues and spinal canal: No prevertebral fluid or swelling. No visible canal hematoma. Disc levels: No advanced spinal canal or neural foraminal stenosis. Bilateral C3-C4 facet fusion. Severe right C7-T1 foraminal stenosis. Upper chest: No pneumothorax, pulmonary nodule or pleural effusion. Other: Normal visualized paraspinal cervical soft tissues. IMPRESSION: 1.  Chronic ischemic microangiopathy without acute intracranial abnormality. 2. No acute fracture of the cervical spine. 3. Status post C4-C7 ACDF with grade 1 C7-T1 anterolisthesis. Electronically Signed   By: Ulyses Jarred M.D.   On: 09/19/2017 00:36   Ct Cervical Spine Wo Contrast  Result Date: 09/19/2017 CLINICAL DATA:  Fall.  Currently on anticoagulation treatment. EXAM: CT HEAD WITHOUT CONTRAST CT CERVICAL SPINE WITHOUT CONTRAST TECHNIQUE: Multidetector CT imaging of the head and cervical spine was performed following the standard protocol without intravenous contrast. Multiplanar CT image reconstructions of the cervical spine were also generated. COMPARISON:  Head CT 08/17/2017 FINDINGS: CT HEAD FINDINGS Brain: No mass lesion, intraparenchymal hemorrhage or extra-axial collection. No evidence of acute cortical infarct. There is periventricular hypoattenuation compatible with chronic microvascular disease. Vascular: No hyperdense vessel or unexpected calcification. Skull: Normal visualized skull base, calvarium and extracranial soft tissues. Sinuses/Orbits: No sinus fluid levels or advanced mucosal thickening. No mastoid effusion. Normal orbits. CT CERVICAL SPINE FINDINGS Alignment: There is grade 1 anterolisthesis at C7-T1, likely degenerative. Skull base and vertebrae: No acute fracture. There is C4-C7 ACDF without hardware abnormality. Soft tissues and spinal canal: No prevertebral fluid or swelling. No visible canal hematoma. Disc levels: No advanced spinal canal or neural foraminal stenosis. Bilateral C3-C4 facet fusion. Severe right C7-T1 foraminal stenosis. Upper chest: No pneumothorax, pulmonary nodule or pleural effusion. Other: Normal visualized paraspinal cervical soft tissues. IMPRESSION: 1. Chronic ischemic microangiopathy without acute intracranial abnormality. 2. No acute fracture of the cervical spine. 3. Status post C4-C7 ACDF with grade 1 C7-T1 anterolisthesis. Electronically Signed   By:  Ulyses Jarred M.D.   On: 09/19/2017 00:36   Dg Hip Unilat W Or Wo Pelvis 2-3 Views Left  Result Date: 09/19/2017 CLINICAL DATA:  Initial evaluation for acute trauma, fall. EXAM: DG HIP (WITH OR WITHOUT PELVIS) 2-3V LEFT COMPARISON:  None. FINDINGS: There is an acute fracture subcapital fracture extending through the left femoral neck with slight superior subluxation. Femoral head remains normally aligned within the acetabulum. Limited views of the right hip demonstrate no acute abnormality. Bony pelvis intact. Advanced osteopenia. No acute soft tissue abnormality. Prominent aorto bi-iliac atherosclerotic disease noted. IMPRESSION: Acute mildly displaced subcapital fracture of the left femoral neck. Electronically Signed   By: Jeannine Boga M.D.   On: 09/19/2017 00:30    ROS Blood pressure (!) 146/96, pulse 94, temperature 98.9 F (37.2 C), temperature source Oral, resp. rate 16, SpO2 91 %. Physical Exam  84 year old female, alert, poor historian, pleasant  at time of evaluation.  Accompanied by daughter [name].  Left lower extremity - left leg is flexed and externally rotated for position of comfort. Motor - patient is able to move foot and toes slowly on exam. Trace DP and PT pulse and Pop pulse is noted on exam. Moderate pain is noted on any attempt of internal/external hip roll.  EXAM: DG HIP (WITH OR WITHOUT PELVIS) 2-3V LEFT IMPRESSION: Acute mildly displaced subcapital fracture of the left femoral neck.  Assessment/Plan: Mildly Displaced Left Femoral Neck Fracture  Admit per Medicine.  Bedrest.  Allow diet today and then clear liquids tomorrow morning, and then NPO after breakfast tomorrow. Consent.  Jerimie Mancuso 09/19/2017, 10:01 AM     

## 2017-09-19 NOTE — Clinical Social Work Note (Cosign Needed)
Clinical Social Work Assessment  Patient Details  Name: [name] MRN: [id number] Date of Birth: 20-Jul-1929  Date of referral:  09/19/17               Reason for consult:  Facility Placement                Permission sought to share information with:  Other(Daughter, [name]) Permission granted to share information::  Yes, Verbal Permission Granted  Name::        Agency::     Relationship::     Contact Information:     Housing/Transportation Living arrangements for the past 2 months:  [place] of Information:  Adult Children Patient Interpreter Needed:  None Criminal Activity/Legal Involvement Pertinent to Current Situation/Hospitalization:    Significant Relationships:  Adult Children Lives with:  Facility resident  Do you feel safe going back to the place where you live?  Yes Need for family participation in patient care:     Care giving concerns:  Patient presents to the hospital from [facility] [address] for unwitnessed fall. No observed deformities, pt was c/o of pain everywhere but has hx of chronic pain.    Social Worker assessment / plan:  CSW intern met with patient and patients daughter, [name] [phone]) via bedside to discuss nursing home placement. Patient was not oriented to time or place and has a hx of dementia, thus leaving the daughter to speak with this Probation officer. Patients daughter informed Probation officer that patient has only been at [facility] [address] memory care since 09/08/17. Patient is being seen by an orthopedic surgeon to investigate further treatment for a left hip fracture. Patients daughters voiced concerns regarding level of care needed after patients surgery. CSW intern informed patients daughter that depending on patients surgery and what PT recommends will determine level of care at discharge. Patients daughter expressed that she would like patient to return to [facility] on [address] if appropriate/ able.  Plan: CSW  will follow up with patient/ family after surgery to determine level of care needed.   Employment status:  Retired Nurse, adult PT Recommendations:  Not assessed at this time Information / Referral to community resources:     Patient/Family's Response to care:  Patients daughter was receptive to care being provided and was pleased with the communication between her and staff at hospital.  Patient/Family's Understanding of and Emotional Response to Diagnosis, Current Treatment, and Prognosis:  Patients family was very understanding of patients diagnosis and current treatment.  Emotional Assessment Appearance:  Appears stated age Attitude/Demeanor/Rapport:  Unable to Assess Affect (typically observed):    Orientation:    Alcohol / Substance use:    Psych involvement (Current and /or in the community):  No (Comment)  Discharge Needs  Concerns to be addressed:  No discharge needs identified Readmission within the last 30 days:  Yes Current discharge risk:  None Barriers to Discharge:  No Barriers Identified   Willeen Niece, Student-Social Work 09/19/2017, 10:27 AM

## 2017-09-19 NOTE — Progress Notes (Addendum)
Pt seen and examined at bedside, admitted from memory care unit. Sustained Left femoral neck fracture. Tentative plan for surgery tomorrow. Pan to obtain ECHO and CXR pre op, pt with known chronic diastolic and systolic CHF.  Debbora PrestoMAGICK-Cornelis Kluver, MD  [facility] Pager [phone]  If 7PM-7AM, please contact night-coverage [email or web address] Password [email or web address]

## 2017-09-19 NOTE — ED Notes (Signed)
Pt attempted to urinate in female urinal but was unable to do so.

## 2017-09-19 NOTE — Plan of Care (Signed)
Patient admitted to [facility] from emergency room with left femoral head fracture.  Patient very combative with staff, kicking, pinching and hitting when staff attempts to provide any care.  Daughter at bedside.  Haldol  administered for agitation with some improvement, patient does c/o pain although she is unable to give exact location of pain.  MS given x 1 with improvement.  Daughter signed consent forms and these were placed on chart.

## 2017-09-19 NOTE — ED Notes (Signed)
Pt began to get very agitated and irate. Pt started trying to pull foley catheter out.  In attempt to replace stat-lock patient began hitting, kicking, and scratching staff.  Attempt to put on posey belt to keep patient in bed without success due to aggression and safety for staff.  Pt emergently placed in soft wrist restraints.  Pt still trying to kick staff but avoided using four points due to hip injury.  Ativan had been administered prior to restraint use in attempt to calm patient down.  Hospitalist not present during emergency but paged immediately after patient secured.  Foley catheter still in place with new stat-lock. Family member now at bedside. Made aware of situation and they verbalized understanding.

## 2017-09-20 ENCOUNTER — Inpatient Hospital Stay (HOSPITAL_COMMUNITY): Payer: Medicare Other

## 2017-09-20 ENCOUNTER — Inpatient Hospital Stay (HOSPITAL_COMMUNITY): Payer: Medicare Other | Admitting: Certified Registered Nurse Anesthetist

## 2017-09-20 ENCOUNTER — Encounter (HOSPITAL_COMMUNITY): Payer: Self-pay | Admitting: Certified Registered Nurse Anesthetist

## 2017-09-20 ENCOUNTER — Encounter (HOSPITAL_COMMUNITY)
Admission: EM | Disposition: A | Discharge status: Skilled Nursing Facility | Payer: Self-pay | Source: Home / Self Care | Attending: Internal Medicine

## 2017-09-20 DIAGNOSIS — I5042 Chronic combined systolic (congestive) and diastolic (congestive) heart failure: Secondary | ICD-10-CM

## 2017-09-20 HISTORY — PX: HIP ARTHROPLASTY: SHX981

## 2017-09-20 LAB — BASIC METABOLIC PANEL
ANION GAP: 5 (ref 5–15)
BUN: 9 mg/dL (ref 6–20)
CALCIUM: 8.1 mg/dL — AB (ref 8.9–10.3)
CO2: 28 mmol/L (ref 22–32)
Chloride: 105 mmol/L (ref 101–111)
Creatinine, Ser: 0.63 mg/dL (ref 0.44–1.00)
GFR calc non Af Amer: >60 mL/min (ref >60)
GLUCOSE: 94 mg/dL (ref 65–99)
POTASSIUM: 3.8 mmol/L (ref 3.5–5.1)
Sodium: 138 mmol/L (ref 135–145)

## 2017-09-20 LAB — ECHOCARDIOGRAM COMPLETE
HEIGHTINCHES: 61 in
Weight: 1541.46 oz

## 2017-09-20 LAB — CBC
HEMATOCRIT: 31.8 % — AB (ref 36.0–46.0)
HEMOGLOBIN: 10 g/dL — AB (ref 12.0–15.0)
MCH: 29.2 pg (ref 26.0–34.0)
MCHC: 31.4 g/dL (ref 30.0–36.0)
MCV: 92.7 fL (ref 78.0–100.0)
Platelets: 334 10*3/uL (ref 150–400)
RBC: 3.43 MIL/uL — AB (ref 3.87–5.11)
RDW: 14.3 % (ref 11.5–15.5)
WBC: 5.7 10*3/uL (ref 4.0–10.5)

## 2017-09-20 SURGERY — HEMIARTHROPLASTY, HIP, DIRECT ANTERIOR APPROACH, FOR FRACTURE
Anesthesia: General | Laterality: Left

## 2017-09-20 MED ORDER — FENTANYL CITRATE (PF) 100 MCG/2ML IJ SOLN
INTRAMUSCULAR | Status: DC | PRN
  Administered 2017-09-20 (×3): 50 ug via INTRAVENOUS

## 2017-09-20 MED ORDER — DEXAMETHASONE SODIUM PHOSPHATE 4 MG/ML IJ SOLN
INTRAMUSCULAR | Status: DC | PRN
  Administered 2017-09-20: 5 mg via INTRAVENOUS

## 2017-09-20 MED ORDER — MORPHINE SULFATE (PF) 4 MG/ML IV SOLN
INTRAVENOUS | Status: AC
  Filled 2017-09-20: qty 1

## 2017-09-20 MED ORDER — MORPHINE SULFATE (PF) 4 MG/ML IV SOLN
1.0000 mg | INTRAVENOUS | Status: DC | PRN
  Administered 2017-09-20: 1 mg via INTRAVENOUS

## 2017-09-20 MED ORDER — EPHEDRINE 5 MG/ML INJ
INTRAVENOUS | Status: AC
  Filled 2017-09-20: qty 10

## 2017-09-20 MED ORDER — ONDANSETRON HCL 4 MG/2ML IJ SOLN
INTRAMUSCULAR | Status: DC | PRN
  Administered 2017-09-20: 4 mg via INTRAVENOUS

## 2017-09-20 MED ORDER — PROMETHAZINE HCL 25 MG/ML IJ SOLN: 6.2500 mg | INTRAMUSCULAR | Status: DC | PRN

## 2017-09-20 MED ORDER — ROCURONIUM BROMIDE 10 MG/ML (PF) SYRINGE
PREFILLED_SYRINGE | INTRAVENOUS | Status: DC | PRN
  Administered 2017-09-20: 30 mg via INTRAVENOUS

## 2017-09-20 MED ORDER — FENTANYL CITRATE (PF) 100 MCG/2ML IJ SOLN
INTRAMUSCULAR | Status: AC
  Filled 2017-09-20: qty 2

## 2017-09-20 MED ORDER — PHENYLEPHRINE 40 MCG/ML (10ML) SYRINGE FOR IV PUSH (FOR BLOOD PRESSURE SUPPORT)
PREFILLED_SYRINGE | INTRAVENOUS | Status: DC | PRN
  Administered 2017-09-20 (×3): 80 ug via INTRAVENOUS
  Administered 2017-09-20: 120 ug via INTRAVENOUS

## 2017-09-20 MED ORDER — PROPOFOL 10 MG/ML IV BOLUS
INTRAVENOUS | Status: AC
  Filled 2017-09-20: qty 20

## 2017-09-20 MED ORDER — ONDANSETRON HCL 4 MG/2ML IJ SOLN
INTRAMUSCULAR | Status: AC
  Filled 2017-09-20: qty 6

## 2017-09-20 MED ORDER — EPHEDRINE SULFATE-NACL 50-0.9 MG/10ML-% IV SOSY
PREFILLED_SYRINGE | INTRAVENOUS | Status: DC | PRN
  Administered 2017-09-20: 10 mg via INTRAVENOUS

## 2017-09-20 MED ORDER — SODIUM CHLORIDE 0.9 % IV BOLUS (SEPSIS)
500.0000 mL | Freq: Once | INTRAVENOUS | Status: AC | PRN
  Administered 2017-09-20: 500 mL via INTRAVENOUS

## 2017-09-20 MED ORDER — PHENYLEPHRINE 40 MCG/ML (10ML) SYRINGE FOR IV PUSH (FOR BLOOD PRESSURE SUPPORT)
PREFILLED_SYRINGE | INTRAVENOUS | Status: AC
  Filled 2017-09-20: qty 30

## 2017-09-20 MED ORDER — ETOMIDATE 2 MG/ML IV SOLN
INTRAVENOUS | Status: DC | PRN
  Administered 2017-09-20: 10 mg via INTRAVENOUS

## 2017-09-20 MED ORDER — ETOMIDATE 2 MG/ML IV SOLN
INTRAVENOUS | Status: AC
  Filled 2017-09-20: qty 10

## 2017-09-20 MED ORDER — SUGAMMADEX SODIUM 200 MG/2ML IV SOLN
INTRAVENOUS | Status: DC | PRN
  Administered 2017-09-20: 100 mg via INTRAVENOUS

## 2017-09-20 MED ORDER — SODIUM CHLORIDE 0.9 % IR SOLN
Status: DC | PRN
  Administered 2017-09-20: 1000 mL

## 2017-09-20 SURGICAL SUPPLY — 47 items
BAG SPEC THK2 15X12 ZIP CLS (MISCELLANEOUS) ×1
BAG ZIPLOCK 12X15 (MISCELLANEOUS) ×2 IMPLANT
BIT DRILL 2.8X128 (BIT) ×2 IMPLANT
BLADE EXTENDED COATED 6.5IN (ELECTRODE) ×2 IMPLANT
BLADE SAW SGTL 73X25 THK (BLADE) ×2 IMPLANT
BRUSH FEMORAL CANAL (MISCELLANEOUS) ×2 IMPLANT
CAPT HIP HEMI 1 ×1 IMPLANT
COVER SURGICAL LIGHT HANDLE (MISCELLANEOUS) ×2 IMPLANT
DRAPE INCISE IOBAN 66X45 STRL (DRAPES) ×2 IMPLANT
DRAPE ORTHO SPLIT 77X108 STRL (DRAPES) ×4
DRAPE POUCH INSTRU U-SHP 10X18 (DRAPES) ×2 IMPLANT
DRAPE SURG ORHT 6 SPLT 77X108 (DRAPES) ×2 IMPLANT
DRAPE U-SHAPE 47X51 STRL (DRAPES) ×2 IMPLANT
DRSG EMULSION OIL 3X16 NADH (GAUZE/BANDAGES/DRESSINGS) ×2 IMPLANT
DRSG MEPILEX BORDER 4X4 (GAUZE/BANDAGES/DRESSINGS) ×2 IMPLANT
DRSG MEPILEX BORDER 4X8 (GAUZE/BANDAGES/DRESSINGS) ×2 IMPLANT
DURAPREP 26ML APPLICATOR (WOUND CARE) ×2 IMPLANT
ELECT REM PT RETURN 15FT ADLT (MISCELLANEOUS) ×2 IMPLANT
EVACUATOR 1/8 PVC DRAIN (DRAIN) ×2 IMPLANT
FACESHIELD WRAPAROUND (MASK) ×6 IMPLANT
FACESHIELD WRAPAROUND OR TEAM (MASK) ×3 IMPLANT
GAUZE SPONGE 4X4 12PLY STRL (GAUZE/BANDAGES/DRESSINGS) ×3 IMPLANT
GLOVE BIO SURGEON STRL SZ7.5 (GLOVE) ×2 IMPLANT
GLOVE BIO SURGEON STRL SZ8 (GLOVE) ×4 IMPLANT
GLOVE BIOGEL PI IND STRL 8 (GLOVE) ×1 IMPLANT
GLOVE BIOGEL PI INDICATOR 8 (GLOVE) ×1
GOWN STRL REUS W/TWL LRG LVL3 (GOWN DISPOSABLE) ×2 IMPLANT
GOWN STRL REUS W/TWL XL LVL3 (GOWN DISPOSABLE) ×2 IMPLANT
HANDPIECE INTERPULSE COAX TIP (DISPOSABLE) ×2
IMMOBILIZER KNEE 20 (SOFTGOODS)
IMMOBILIZER KNEE 20 THIGH 36 (SOFTGOODS) IMPLANT
KIT BASIN OR (CUSTOM PROCEDURE TRAY) ×2 IMPLANT
MANIFOLD NEPTUNE II (INSTRUMENTS) ×2 IMPLANT
PACK TOTAL JOINT (CUSTOM PROCEDURE TRAY) ×2 IMPLANT
PASSER SUT SWANSON 36MM LOOP (INSTRUMENTS) ×2 IMPLANT
POSITIONER SURGICAL ARM (MISCELLANEOUS) ×2 IMPLANT
PRESSURIZER FEMORAL UNIV (MISCELLANEOUS) ×2 IMPLANT
SET HNDPC FAN SPRY TIP SCT (DISPOSABLE) ×1 IMPLANT
STRIP CLOSURE SKIN 1/2X4 (GAUZE/BANDAGES/DRESSINGS) ×3 IMPLANT
SUT ETHIBOND NAB CT1 #1 30IN (SUTURE) ×4 IMPLANT
SUT MNCRL AB 4-0 PS2 18 (SUTURE) ×2 IMPLANT
SUT VIC AB 2-0 CT1 27 (SUTURE) ×2
SUT VIC AB 2-0 CT1 TAPERPNT 27 (SUTURE) ×1 IMPLANT
SUT VLOC 180 0 24IN GS25 (SUTURE) ×6 IMPLANT
TOWEL OR 17X26 10 PK STRL BLUE (TOWEL DISPOSABLE) ×4 IMPLANT
TOWEL OR NON WOVEN STRL DISP B (DISPOSABLE) ×2 IMPLANT
TOWER CARTRIDGE SMART MIX (DISPOSABLE) ×2 IMPLANT

## 2017-09-20 NOTE — Anesthesia Preprocedure Evaluation (Addendum)
Anesthesia Evaluation  Patient identified by MRN, date of birth, ID band Patient awake    Reviewed: Allergy & Precautions, NPO status , Patient's Chart, lab work & pertinent test results  Airway Mallampati: II  TM Distance: >3 FB Neck ROM: Full    Dental no notable dental hx.    Pulmonary pneumonia, resolved, COPD, Current Smoker,    breath sounds clear to auscultation + decreased breath sounds      Cardiovascular hypertension, +CHF   Rhythm:Regular Rate:Tachycardia     Neuro/Psych Severe dementia negative neurological ROS  negative psych ROS   GI/Hepatic negative GI ROS, Neg liver ROS,   Endo/Other  negative endocrine ROS  Renal/GU negative Renal ROS  negative genitourinary   Musculoskeletal negative musculoskeletal ROS (+)   Abdominal   Peds negative pediatric ROS (+)  Hematology  (+) anemia ,   Anesthesia Other Findings   Reproductive/Obstetrics negative OB ROS                            Anesthesia Physical Anesthesia Plan  ASA: IV  Anesthesia Plan: General   Post-op Pain Management:    Induction: Intravenous  PONV Risk Score and Plan: 2 and Ondansetron, Dexamethasone and Treatment may vary due to age or medical condition  Airway Management Planned: Oral ETT  Additional Equipment:   Intra-op Plan:   Post-operative Plan: Extubation in OR  Informed Consent: I have reviewed the patients History and Physical, chart, labs and discussed the procedure including the risks, benefits and alternatives for the proposed anesthesia with the patient or authorized representative who has indicated his/her understanding and acceptance.   Dental advisory given  Plan Discussed with: CRNA and Surgeon  Anesthesia Plan Comments:         Anesthesia Quick Evaluation

## 2017-09-20 NOTE — Progress Notes (Signed)
  Echocardiogram 2D Echocardiogram has been performed.  Cathie Bonnell L Androw 09/20/2017, 3:59 PM

## 2017-09-20 NOTE — Care Management Note (Signed)
Case Management Note  Patient Details  Name: Deborah MoronBetty B Goertz MRN: [id number] Date of Birth: 1929-08-18  Subjective/Objective:      Pt admitted with closed left hip fracture              Action/Plan: Spoke with pt's daughter concerning discharge plans. Pt is from [facility] and plan to discharge to [facility]/SNF per daughter.    Expected Discharge Date:  (unknown)               Expected Discharge Plan:  Skilled Nursing Facility  In-House Referral:  Clinical Social Work  Discharge planning Services  CM Consult  Post Acute Care Choice:    Choice offered to:  Adult Children  DME Arranged:    DME Agency:     HH Arranged:    HH Agency:     Status of Service:  In process, will continue to follow  If discussed at Long Length of Stay Meetings, dates discussed:    Additional CommentsGeni Bers:  [name], RN 09/20/2017, 12:13 PM

## 2017-09-20 NOTE — Progress Notes (Addendum)
Patient ID: Deborah MoronBetty B Ballin, female   DOB: Oct 13, 1928, 84 y.o.   MRN: [id number]     PROGRESS NOTE  [name]  WUJ:[id number] DOB: Oct 13, 1928 DOA: 09/18/2017  PCP: Marden NobleGates, Robert, MD   Brief Narrative:  Pt is 84 yo female, resident of memory care unit due to advanced dementia, combined CHF, COPD, presented to ED after an unwitnessed fall at the facility. Patient has sustained left hip fracture, ortho team consulted and plan for surgery on 01/23. UA with findings consistent with UTI.   Assessment & Plan:   Principal Problem:   Closed left hip fracture, initial encounter (HCC) - pt still with pain but says it is controlled with pain medications - pre op CXr clear with no acute findings - also no cardiac concerns this AM - plan to take to OR this AM - analgesia as needed - DVT prophylaxis per ortho   Active Problems:   Chronic combined systolic and diastolic CHF (congestive heart failure) (HCC) - no evidence of volume overload - ECHO pending - monitor daily weights, strict I/O Filed Weights   09/20/17 0207  Weight: 43.7 kg (96 lb 5.5 oz)     Hypertension - somewhat low BP overnight, SBP in 80's - will hold losartan    COPD (chronic obstructive pulmonary disease) (HCC) - respiratory status stable this AM, no hypoxia    ? UTI - no specific concerns but pt with known advanced dementia - continue doxy for now, urine cultures not obtained on admission - pt did have low grade fevers overnight, but this could be related to acute hip fracture rather than UTI - WBC has remained stable  - order placed and will follow up     Advanced dementia - mental status at baseline this AM  - at high risk for delirium, had few episodes of agitation yesterday but now more calm - haldol as needed    DVT prophylaxis: SCD's, per ortho team Code Status: DNR Family Communication: Patient and daughter at bedside Disposition Plan: to be determined   Consultants:   Ortho   Procedures:    None  Antimicrobials:   Doxycycline 01/22 -->  Vancomycin 01/22 --> x 1 dose pre op, Staph PCR positive   Subjective: No events overnight, pt still with left leg pain.   Objective: Vitals:   09/20/17 0207 09/20/17 0227 09/20/17 0254 09/20/17 0610  BP:  (!) 84/48 (!) 78/44 117/63  Pulse: 84   98  Resp: 14   16  Temp: 98.4 F (36.9 C)   99.1 F (37.3 C)  TempSrc: Axillary   Oral  SpO2: 93%   93%  Weight: 43.7 kg (96 lb 5.5 oz)     Height: 5\' 1"  (1.549 m)       Intake/Output Summary (Last 24 hours) at 09/20/2017 0948 Last data filed at 09/20/2017 [id number] Gross per 24 hour  Intake 2025 ml  Output 1650 ml  Net 375 ml   Filed Weights   09/20/17 0207  Weight: 43.7 kg (96 lb 5.5 oz)    Examination:  General exam: Appears calm and comfortable  Respiratory system: Clear to auscultation. Respiratory effort normal. Cardiovascular system: S1 & S2 heard, RRR. No rubs, gallops or clicks.  Gastrointestinal system: Abdomen is nondistended, soft and nontender. No organomegaly or masses felt.  Central nervous system: Alert, follows some commands, moving all 4 extremities but pain in the left hip area is limiting her movement  Extremities: TTP in left hip area  Skin: No  rashes, lesions or ulcers  Data Reviewed: I have personally reviewed following labs and imaging studies  CBC: Recent Labs  Lab 09/19/17 0113 09/19/17 0535 09/20/17 0428  WBC 7.1 5.3 5.7  NEUTROABS 5.7  --   --   HGB 10.5* 10.4* 10.0*  HCT 33.5* 33.5* 31.8*  MCV 92.0 93.1 92.7  PLT 366 346 334   Basic Metabolic Panel: Recent Labs  Lab 09/19/17 0113 09/19/17 0535 09/20/17 0428  NA 137 140 138  K 3.6 3.7 3.8  CL 106 106 105  CO2 25 28 28   GLUCOSE 137* 107* 94  BUN 14 11 9   CREATININE 0.55 0.62 0.63  CALCIUM 8.5* 8.5* 8.1*   Coagulation Profile: Recent Labs  Lab 09/19/17 0113  INR 1.15   Urine analysis:    Component Value Date/Time   COLORURINE YELLOW 09/19/2017 0424   APPEARANCEUR HAZY  (A) 09/19/2017 0424   LABSPEC 1.015 09/19/2017 0424   PHURINE 8.0 09/19/2017 0424   GLUCOSEU NEGATIVE 09/19/2017 0424   HGBUR NEGATIVE 09/19/2017 0424   BILIRUBINUR NEGATIVE 09/19/2017 0424   KETONESUR NEGATIVE 09/19/2017 0424   PROTEINUR NEGATIVE 09/19/2017 0424   UROBILINOGEN 0.2 04/25/2014 1827   NITRITE NEGATIVE 09/19/2017 0424   LEUKOCYTESUR TRACE (A) 09/19/2017 0424   Recent Results (from the past 240 hour(s))  Surgical pcr screen     Status: Abnormal   Collection Time: 09/19/17  5:51 PM  Result Value Ref Range Status   MRSA, PCR NEGATIVE NEGATIVE Final   Staphylococcus aureus POSITIVE (A) NEGATIVE Final    Comment: (NOTE) The Xpert SA Assay (FDA approved for NASAL specimens in patients 45 years of age and older), is one component of a comprehensive surveillance program. It is not intended to diagnose infection nor to guide or monitor treatment.     Radiology Studies: Dg Chest 1 View Result Date: 09/19/2017 There is no acute cardiopulmonary abnormality. Thoracic aortic atherosclerosis.   Ct Head Wo Contrast Ct Cervical Spine Wo Contrast  Result Date: 09/19/2017 Chronic ischemic microangiopathy without acute intracranial abnormality. No acute fracture of the cervical spine. Status post C4-C7 ACDF with grade 1 C7-T1 anterolisthesis.  Dg Hip Unilat W Or Wo Pelvis 2-3 Views Left Result Date: 09/19/2017 Acute mildly displaced subcapital fracture of the left femoral neck.   Scheduled Meds: . fentaNYL  25 mcg Transdermal Q72H  . fluticasone furoate-vilanterol  1 puff Inhalation QHS  . LORazepam  0.5 mg Oral BID  . losartan  50 mg Oral Daily  . magnesium oxide  400 mg Oral Daily  . pantoprazole  40 mg Oral BID  . pramipexole  1 mg Oral QHS  . QUEtiapine  100 mg Oral Once per day on Sun Tue Thu Sat  . sertraline  25 mg Oral QHS  . tiotropium  18 mcg Inhalation Daily   Continuous Infusions: . doxycycline (VIBRAMYCIN) IV Stopped (09/19/17 2304)  . lactated ringers 125  mL/hr at 09/20/17 0205  . vancomycin       LOS: 1 day   Time spent: 25 minutes   Debbora Presto, MD [facility] Pager [phone]  If 7PM-7AM, please contact night-coverage [email or web address] Password [facility] 09/20/2017, 9:48 AM

## 2017-09-20 NOTE — Anesthesia Procedure Notes (Signed)
Procedure Name: Intubation °Performed by: Anoop Hemmer J, CRNA °Pre-anesthesia Checklist: Patient identified, Emergency Drugs available, Suction available, Patient being monitored and Timeout performed °Patient Re-evaluated:Patient Re-evaluated prior to induction °Oxygen Delivery Method: Circle system utilized °Preoxygenation: Pre-oxygenation with 100% oxygen °Induction Type: IV induction °Ventilation: Mask ventilation without difficulty °Laryngoscope Size: Mac and 3 °Grade View: Grade I °Tube type: Oral °Tube size: 7.0 mm °Number of attempts: 1 °Airway Equipment and Method: Stylet °Placement Confirmation: ETT inserted through vocal cords under direct vision,  positive ETCO2,  CO2 detector and breath sounds checked- equal and bilateral °Secured at: 21 cm °Tube secured with: Tape °Dental Injury: Teeth and Oropharynx as per pre-operative assessment  ° ° ° ° ° ° °

## 2017-09-20 NOTE — Interval H&P Note (Signed)
History and Physical Interval Note:  09/20/2017 5:15 PM  [name]  has presented today for surgery, with the diagnosis of fractured left hip  The various methods of treatment have been discussed with the patient and family. After consideration of risks, benefits and other options for treatment, the patient has consented to  Procedure(s): ARTHROPLASTY BIPOLAR HIP (HEMIARTHROPLASTY) LEFT (Left) as a surgical intervention .  The patient's history has been reviewed, patient examined, no change in status, stable for surgery.  I have reviewed the patient's chart and labs.  Questions were answered to the patient's satisfaction.     Homero FellersFrank Azeneth Carbonell

## 2017-09-20 NOTE — Op Note (Signed)
OPERATIVE REPORT   PREOPERATIVE DIAGNOSIS: Left femoral neck fracture.   POSTOPERATIVE DIAGNOSIS:  Left femoral neck fracture.   PROCEDURE: Left hip bipolar hemiarthroplasty.   SURGEON: Ollen Gross, MD   ASSISTANT: Avel Peace, PA-C  ANESTHESIA: General   ESTIMATED BLOOD LOSS: 100 mL   DRAINS: Hemovac x1.   COMPLICATIONS: . None  CONDITION: PACU - hemodynamically stable.  BRIEF CLINICAL NOTE:  Deborah Graham Ramseyis an 84 y.o. year-old female, who had a fall  at her facility several days ago. The patient was noted to have a displaced Left femoral neck fracture. We decided to perform a hip  hemiarthroplasty. The patient presents now to the operating room for the above-  mentioned procedure.  PROCEDURE IN DETAIL: After successful administration of  General anesthetic, the patient was placed in theRight lateral decubitus position with the Left side up and held with the hip positioner.  Left lower extremities isolated from the perineum with plastic drapes and prepped and draped in the usual sterile fashion.       A short posterolateral incision was made with a 10 blade through subcutaneous tissue to the fascia lata which was incised in line with the skin incision. Sciatic nerve  was palpated and protected. Short rotators and capsule were isolated  off the femur. Fracture hematoma was evacuated from the hip. Fracture is  identified. Oscillating saw was used to freshen up the femoral neck cut  to the appropriate height and configuration. The femoral neck and head  were then removed. Femoral retractors were then placed. Starter reamer  was passed and the canal thoroughly irrigated with saline to remove the  fatty contents. Lateralizing reamer was passed to lateralize the stem.  We then started broaching for DePuy Press-Fit size 1 and went up to size  5 which had excellent torsional and axial stability. A trial neck was  placed with a 28 + 1.5 head and a 47 mm bipolar. A 47 is what was  measured  from the femoral head that we removed. With this construct, the  reduction was performed with excellent suction fit of the head into the  socket. She had excellent stability with full extension, full external  rotation, 70 degrees flexion, 40 degrees adduction, 90 degrees internal  rotation, and 90 degrees of flexion, and 70 degrees of internal  rotation. By placing theLeft  leg on top of the Right, I felt as though the leg lengths were equal. The Hip is then dislocated and trials removed.  Permanent size 5 Press-Fit DePuy [place] basic stem is placed. This had excellent  purchase in the femur. The 28 + 1.5 metal head and the 47 mm bipolar  was placed. The hip was reduced with the same stability parameters. The wound  was copiously irrigated with saline solution and then the capsule and  short rotators reattached to the femur through drill holes with Ethibond  suture.  30 mL of 0.25% Bupivicaine was then  injected into the posterior capsule, the fascia lata, gluteal muscles,  and subcu tissues. The fascia lata was then closed over a Hemovac  drain with running #1 Stratofix suture. Subcu was closed with interrupted 2-  0 Vicryl and subcuticular running 4-0 Monocryl. Incisions cleaned and  dried and Steri-Strips, and bulky sterile dressing applied. Drain is  hooked to suction. Patient was awakened, placed into a knee immobilizer  and transported to recovery in stable condition.       Please note that a surgical assistant is a medical necessity  for this  procedure to perform it in a safe and expeditious manner. Surgical  assistant was necessary for retraction of vital neurovascular structures  and also for proper positioning of the limb for safe placement of the  trial and permanent prostheses.

## 2017-09-20 NOTE — Transfer of Care (Signed)
Immediate Anesthesia Transfer of Care Note  Patient: [name]  Procedure(s) Performed: ARTHROPLASTY BIPOLAR HIP (HEMIARTHROPLASTY) LEFT (Left )  Patient Location: PACU  Anesthesia Type:General  Level of Consciousness: sedated, patient cooperative and responds to stimulation  Airway & Oxygen Therapy: Patient Spontanous Breathing and Patient connected to face mask oxygen  Post-op Assessment: Report given to RN and Post -op Vital signs reviewed and stable  Post vital signs: Reviewed and stable  Last Vitals:  Vitals:   09/20/17 1130 09/20/17 1429  BP: 123/62 (!) 136/49  Pulse: 96 (!) 101  Resp: 16 16  Temp: 36.7 C 37.2 C  SpO2: 92% 93%    Last Pain:  Vitals:   09/20/17 1436  TempSrc:   PainSc: 3          Complications: No apparent anesthesia complications

## 2017-09-20 NOTE — Progress Notes (Signed)
Initial Nutrition Assessment  DOCUMENTATION CODES:   Severe malnutrition in context of chronic illness  INTERVENTION:    Mighty Shake II BID with meals, each supplement provides 480-500 kcals and 20-23 grams of protein  Liberalize diet to regular  NUTRITION DIAGNOSIS:   Severe Malnutrition related to chronic illness(dementia) as evidenced by 7% weight loss in two months, energy intake < or equal to 50% for > or equal to 1 month, severe fat depletion, severe muscle depletion.  GOAL:    Patient will meet greater than or equal to 90% of their needs   MONITOR:   PO intake, Supplement acceptance, Weight trends, Labs, Diet advancement  REASON FOR ASSESSMENT:   Malnutrition Screening Tool    ASSESSMENT:   Pt with PMH significant for dementia, CHF, COPD, and depression. Presents this admission after a mechanical fall resulting in closed left hip fracture. Plan for repair 1/23.    Spoke with daughter at bedside. While at [facility] pt was severed 3 meals per and she would typically finish 25%. They did not provide supplements. After her hospitalization 07/2017 for PNA, [facility] was [facility] and they began sending her high calorie high protein shakes (like mighty shake). Appetite has increased but not by much. Daughter requests pt be sent Mighty shake and asked if diet could be liberalized to regular diet. Will speak with MD.   Note: pt does not like supplements in a bottle related to past experience with husband. Will need to severe supplements in a cup.   Daughter reports pt weighed 98 lb her last doctor visit 06/2017. Ten days ago [facility] weighed pt at 83.3 lb.  RD weighed pt on bed scale and obtained reading of 91.3 lb. From November to this admission pt shows a 7% wt loss in two months. Significant for time frame.   Nutrition-Focused physical exam completed.   Medications reviewed and include: fentanyl, Mg oxide, IV abx, LR @ 75 ml/hr Labs reviewed.   NUTRITION -  FOCUSED PHYSICAL EXAM:    Most Recent Value  Orbital Region  Mild depletion  Upper Arm Region  Severe depletion  Thoracic and Lumbar Region  Severe depletion  Buccal Region  Moderate depletion  Temple Region  Severe depletion  Clavicle Bone Region  Severe depletion  Clavicle and Acromion Bone Region  Severe depletion  Scapular Bone Region  Severe depletion  Dorsal Hand  Moderate depletion  Patellar Region  Severe depletion  Anterior Thigh Region  Severe depletion  Posterior Calf Region  Severe depletion  Edema (RD Assessment)  None  Hair  Reviewed  Eyes  Reviewed  Mouth  Reviewed  Skin  Reviewed  Nails  Reviewed     Diet Order:  Diet NPO time specified  EDUCATION NEEDS:   Education needs have been addressed  Skin:  Skin Assessment: Reviewed RN Assessment  Last BM:  PTA  Height:   Ht Readings from Last 1 Encounters:  09/20/17 5\' 1"  (1.549 m)    Weight:   Wt Readings from Last 1 Encounters:  09/20/17 96 lb 5.5 oz (43.7 kg)    Ideal Body Weight:  47.7 kg  BMI:  Body mass index is 18.2 kg/m.  Estimated Nutritional Needs:   Kcal:  1500-1700 kcal/day  Protein:  75-85 g/day  Fluid:  >1.5 L/day    Vanessa Kickarly Keeon Zurn RD, LDN Clinical Nutrition Pager # - [phone]

## 2017-09-21 ENCOUNTER — Encounter (HOSPITAL_COMMUNITY): Payer: Self-pay | Admitting: Orthopedic Surgery

## 2017-09-21 DIAGNOSIS — S72002A Fracture of unspecified part of neck of left femur, initial encounter for closed fracture: Secondary | ICD-10-CM

## 2017-09-21 LAB — BASIC METABOLIC PANEL
Anion gap: 7 (ref 5–15)
BUN: 11 mg/dL (ref 6–20)
CALCIUM: 8.2 mg/dL — AB (ref 8.9–10.3)
CO2: 27 mmol/L (ref 22–32)
CREATININE: 0.67 mg/dL (ref 0.44–1.00)
Chloride: 102 mmol/L (ref 101–111)
GFR calc Af Amer: >60 mL/min (ref >60)
GFR calc non Af Amer: >60 mL/min (ref >60)
GLUCOSE: 146 mg/dL — AB (ref 65–99)
Potassium: 4.3 mmol/L (ref 3.5–5.1)
Sodium: 136 mmol/L (ref 135–145)

## 2017-09-21 LAB — URINE CULTURE: Culture: NO GROWTH

## 2017-09-21 LAB — CBC
HEMATOCRIT: 31 % — AB (ref 36.0–46.0)
Hemoglobin: 9.7 g/dL — ABNORMAL LOW (ref 12.0–15.0)
MCH: 29 pg (ref 26.0–34.0)
MCHC: 31.3 g/dL (ref 30.0–36.0)
MCV: 92.5 fL (ref 78.0–100.0)
PLATELETS: 333 10*3/uL (ref 150–400)
RBC: 3.35 MIL/uL — ABNORMAL LOW (ref 3.87–5.11)
RDW: 14.3 % (ref 11.5–15.5)
WBC: 9.2 10*3/uL (ref 4.0–10.5)

## 2017-09-21 MED ORDER — ENOXAPARIN SODIUM 30 MG/0.3ML ~~LOC~~ SOLN
30.0000 mg | SUBCUTANEOUS | Status: DC
  Administered 2017-09-21 – 2017-09-22 (×2): 30 mg via SUBCUTANEOUS
  Filled 2017-09-21 (×4): qty 0.3

## 2017-09-21 MED ORDER — SENNOSIDES-DOCUSATE SODIUM 8.6-50 MG PO TABS
1.0000 | ORAL_TABLET | Freq: Two times a day (BID) | ORAL | Status: DC
  Administered 2017-09-21 – 2017-09-22 (×4): 1 via ORAL
  Filled 2017-09-21 (×5): qty 1

## 2017-09-21 MED ORDER — SODIUM CHLORIDE 0.9 % IV SOLN
500.0000 mg | Freq: Two times a day (BID) | INTRAVENOUS | Status: AC
  Administered 2017-09-21: 500 mg via INTRAVENOUS
  Filled 2017-09-21: qty 500

## 2017-09-21 MED ORDER — MENTHOL 3 MG MT LOZG
1.0000 | LOZENGE | OROMUCOSAL | Status: DC | PRN
  Filled 2017-09-21: qty 9

## 2017-09-21 MED ORDER — SULFAMETHOXAZOLE-TRIMETHOPRIM 400-80 MG PO TABS
1.0000 | ORAL_TABLET | Freq: Two times a day (BID) | ORAL | Status: DC
  Administered 2017-09-21 – 2017-09-23 (×5): 1 via ORAL
  Filled 2017-09-21 (×5): qty 1

## 2017-09-21 MED ORDER — PHENOL 1.4 % MT LIQD
1.0000 | OROMUCOSAL | Status: DC | PRN
  Filled 2017-09-21: qty 177

## 2017-09-21 MED ORDER — CHLORHEXIDINE GLUCONATE CLOTH 2 % EX PADS
6.0000 | MEDICATED_PAD | Freq: Every day | CUTANEOUS | Status: DC
  Administered 2017-09-21 – 2017-09-22 (×2): 6 via TOPICAL

## 2017-09-21 MED ORDER — MUPIROCIN 2 % EX OINT
1.0000 "application " | TOPICAL_OINTMENT | Freq: Two times a day (BID) | CUTANEOUS | Status: DC
  Administered 2017-09-21 – 2017-09-22 (×4): 1 via NASAL
  Filled 2017-09-21 (×2): qty 22

## 2017-09-21 MED ORDER — POLYETHYLENE GLYCOL 3350 17 G PO PACK
17.0000 g | PACK | Freq: Every day | ORAL | Status: DC
  Administered 2017-09-21 – 2017-09-22 (×2): 17 g via ORAL
  Filled 2017-09-21 (×3): qty 1

## 2017-09-21 NOTE — Evaluation (Signed)
Physical Therapy Evaluation Patient Details Name: Deborah MoronBetty B [name] MRN: [id number] DOB: Oct 10, 1928 Today's Date: 09/21/2017   History of Present Illness  84 y.o. female with medical history significant of dementia, chronic combined systolic and diastolic CHF, COPD, hypertension, prior tobacco abuse, recent 08/17/17 admission for right lower lobe pneumonia and admitted 09/18/17 for Left femoral neck fracture and s/p L hip hemiarthroplasty   Clinical Impression  Patient is s/p above surgery resulting in functional limitations due to the deficits listed below (see PT Problem List).  Patient will benefit from skilled PT to increase their independence and safety with mobility to allow discharge to the venue listed below.  Pt pleasantly confused and attempting to assist with mobility as able.  Pt's daughter educated on posterior hip precautions.  Pt assisted OOB over to recliner.  Daughter would like pt to return to SNF where she recently stayed for rehab due to familiarity and pt's dementia.     Follow Up Recommendations SNF    Equipment Recommendations  None recommended by PT    Recommendations for Other Services       Precautions / Restrictions Precautions Precautions: Fall;Posterior Hip Required Braces or Orthoses: Knee Immobilizer - Left Knee Immobilizer - Left: Other (comment)(in place to help maintain precautions) Restrictions Weight Bearing Restrictions: No LLE Weight Bearing: Weight bearing as tolerated      Mobility  Bed Mobility Overal bed mobility: Needs Assistance Bed Mobility: Supine to Sit     Supine to sit: Max assist;+2 for safety/equipment     General bed mobility comments: assist for lower body due to L hip pain and upper body upright - pt appears hesitant to assist with pain however improved once sitting EOB  Transfers Overall transfer level: Needs assistance Equipment used: Rolling walker (2 wheeled) Transfers: Sit to/from [employer]Stand;Stand Pivot Transfers Sit to  Stand: Mod assist;+2 safety/equipment Stand pivot transfers: Mod assist;+2 safety/equipment       General transfer comment: assist to rise and steady, manual placement of hands, tactile cues for technique, pt demonstrates TDWB with standing activity, assist to recliner; pt assisted with maintaining precautions during mobility  Ambulation/Gait                Stairs            Wheelchair Mobility    Modified Rankin (Stroke Patients Only)       Balance                                             Pertinent Vitals/Pain Pain Assessment: Faces Faces Pain Scale: Hurts even more Pain Location: L hip Pain Descriptors / Indicators: Aching;Sore Pain Intervention(s): Limited activity within patient's tolerance;Repositioned;Monitored during session    Home Living Family/patient expects to be discharged to:: Skilled nursing facility                      Prior Function Level of Independence: Needs assistance   Gait / Transfers Assistance Needed: had been home from rehab for 10 days prior to readmission, requires supervision for dementia           Hand Dominance        Extremity/Trunk Assessment        Lower Extremity Assessment Lower Extremity Assessment: LLE deficits/detail LLE Deficits / Details: requires assist due to pain, maintained KI to assist with maintaining precautions  Communication   Communication: No difficulties  Cognition Arousal/Alertness: Awake/alert Behavior During Therapy: WFL for tasks assessed/performed Overall Cognitive Status: History of cognitive impairments - at baseline                                 General Comments: hx of dementia, pleasantly confused      General Comments      Exercises     Assessment/Plan    PT Assessment Patient needs continued PT services  PT Problem List Decreased strength;Decreased mobility;Decreased range of motion;Decreased activity  tolerance;Decreased balance;Decreased knowledge of use of DME;Pain;Decreased knowledge of precautions       PT Treatment Interventions Gait training;DME instruction;Therapeutic activities;Therapeutic exercise;Patient/family education;Functional mobility training    PT Goals (Current goals can be found in the Care Plan section)  Acute Rehab PT Goals PT Goal Formulation: With patient/family Time For Goal Achievement: 09/28/17 Potential to Achieve Goals: Good    Frequency Min 3X/week   Barriers to discharge        Co-evaluation               AM-PAC PT "6 Clicks" Daily Activity  Outcome Measure Difficulty turning over in bed (including adjusting bedclothes, sheets and blankets)?: Unable Difficulty moving from lying on back to sitting on the side of the bed? : Unable Difficulty sitting down on and standing up from a chair with arms (e.g., wheelchair, bedside commode, etc,.)?: Unable Help needed moving to and from a bed to chair (including a wheelchair)?: A Lot Help needed walking in hospital room?: Total Help needed climbing 3-5 steps with a railing? : Total 6 Click Score: 7    End of Session Equipment Utilized During Treatment: Gait belt;Left knee immobilizer Activity Tolerance: Patient limited by pain Patient left: in chair;with chair alarm set;with family/visitor present;with call bell/phone within reach Nurse Communication: Mobility status PT Visit Diagnosis: Other abnormalities of gait and mobility (R26.89);Pain Pain - Right/Left: Left Pain - part of body: Hip    Time: 1610-9604 PT Time Calculation (min) (ACUTE ONLY): 19 min   Charges:   PT Evaluation $PT Eval Moderate Complexity: 1 Mod     PT G Codes:        Zenovia Jarred, PT, DPT 09/21/2017 Pager: [phone]  Maida Sale E 09/21/2017, 12:23 PM

## 2017-09-21 NOTE — Progress Notes (Signed)
OT Cancellation Note  Patient Details Name: [name] MRN: [id number] DOB: November 01, 1928   Cancelled Treatment:    Reason Eval/Treat Not Completed: Other (comment)  Pt sleeping soundly. Noted plan is to go back to SNF she has been before.  Will [facility] OT [facility] [place], [place] [phone]  Alba CoryREDDING, Merridy Pascoe D 09/21/2017, 1:42 PM

## 2017-09-21 NOTE — Progress Notes (Signed)
CSW following to assist with disposition- pt lives [facility] since 09/08/17. Currently needing SNF for ST rehab post hip fracture (had surgery 09/20/17). Pt/daughter known to CSW from previous admission in 07/2017- [facility] went [facility] following that stay. Daughter states pt progressed very well there and "had settled in [facility]" prior to this admisison. Wants pt to return [facility] if possible and agreed to other referrals in order to have other options if needed. CSW explained to daughter [facility] now requires pre-authorization to admit to SNF (did not in 07/2017) and that as pt has used some of her 100% covered SNF days within past 60 days, pt may have copay associated [facility]. Daughter agreeable. Completed FL2, made referrals and will follow up with bed offers. Insurance auth request can be initiated by facility once bed is confirmed.  Ilean SkillMeghan Oswell Say, MSW, LCSW Clinical Social Work 09/21/2017 [phone]

## 2017-09-21 NOTE — Care Management Note (Signed)
Case Management Note  Patient Details  Name: [name] MRN: [id number] Date of Birth: 1929-03-07  Subjective/Objective:     Closed hip fracture, surgery repairment               Action/Plan: Plan to discharge to SNF   Expected Discharge Date:  (unknown)               Expected Discharge Plan:  Skilled Nursing Facility  In-House Referral:  Clinical Social Work  Discharge planning Services  CM Consult  Post Acute Care Choice:    Choice offered to:  Adult Children  DME Arranged:    DME Agency:     HH Arranged:    HH Agency:     Status of Service:  In process, will continue to follow  If discussed at Long Length of Stay Meetings, dates discussed:    Additional CommentsGeni Bers:  Molli Gethers, RN 09/21/2017, 12:59 PM

## 2017-09-21 NOTE — NC FL2 (Signed)
MEDICAID FL2 LEVEL OF CARE SCREENING TOOL     IDENTIFICATION  Patient Name: [name] Birthdate: 04/10/1929 Sex: female Admission Date (Current Location): 09/18/2017  [facility] [place]   Facility and Address:  [facility],  [address], [place] [id number]      Provider Number: [id number]  Attending Physician Name and Address:  Albertine GratesXu, Fang, MD  Relative Name and Phone Number:       Current Level of Care: Hospital Recommended Level of Care: Skilled Nursing Facility Prior Approval Number:    Date Approved/Denied:   PASRR Number: [id number]  Discharge Plan: SNF    Current Diagnoses: Patient Active Problem List   Diagnosis Date Noted  . Closed left hip fracture, initial encounter (HCC) 09/19/2017  . Hip fracture (HCC) 09/19/2017  . Pneumococcal pneumonia (HCC) 08/26/2017  . Hypertension 08/26/2017  . COPD (chronic obstructive pulmonary disease) (HCC) 08/26/2017  . Psychosis (HCC) 08/26/2017  . Coronary artery disease 08/26/2017  . Sepsis (HCC) 08/17/2017  . Chronic combined systolic and diastolic CHF (congestive heart failure) (HCC) 08/17/2017  . Right lower lobe pneumonia (HCC) 08/17/2017  . Acute combined systolic and diastolic heart failure (HCC) 01/17/2013  . Abnormal cardiovascular stress test 01/17/2013    Class: Acute  . Tobacco use disorder   . WPW (Wolff-Parkinson-White syndrome)     Orientation RESPIRATION BLADDER Height & Weight     Self  O2(2L) Continent Weight: 95 lb 0.3 oz (43.1 kg) Height:  5\' 1"  (154.9 cm)  BEHAVIORAL SYMPTOMS/MOOD NEUROLOGICAL BOWEL NUTRITION STATUS      Continent Diet(regular diet)  AMBULATORY STATUS COMMUNICATION OF NEEDS Skin   Extensive Assist Verbally Surgical wounds, Wound Vac(closed incision left leg- hemovac)                       Personal Care Assistance Level of Assistance  Bathing, Feeding, Dressing Bathing Assistance: Maximum assistance Feeding assistance:  Limited assistance Dressing Assistance: Maximum assistance     Functional Limitations Info  Sight, Hearing, Speech Sight Info: Adequate Hearing Info: Adequate Speech Info: Adequate    SPECIAL CARE FACTORS FREQUENCY  PT (By licensed PT), OT (By licensed OT)     PT Frequency: 5x OT Frequency: 5x            Contractures Contractures Info: Not present    Additional Factors Info  Code Status, Allergies Code Status Info: DNR Allergies Info: Amoxicillin, Ciprofloxacin, Lorabid Loracarbef, Avelox Moxifloxacin, Erythromycin, Inderal Propranolol, Chantix Varenicline           Current Medications (09/21/2017):  This is the current hospital active medication list Current Facility-Administered Medications  Medication Dose Route Frequency Provider Last Rate Last Dose  . Chlorhexidine Gluconate Cloth 2 % PADS 6 each  6 each Topical Daily Albertine GratesXu, Fang, MD   6 each at 09/21/17 1212  . enoxaparin (LOVENOX) injection 30 mg  30 mg Subcutaneous Q24H Ollen GrossAluisio, Frank, MD   30 mg at 09/21/17 0908  . fentaNYL (DURAGESIC - dosed mcg/hr) patch 25 mcg  25 mcg Transdermal Q72H Eduard ClosKakrakandy, Arshad N, MD   25 mcg at 09/19/17 1047  . fluticasone furoate-vilanterol (BREO ELLIPTA) 200-25 MCG/INH 1 puff  1 puff Inhalation QHS Eduard ClosKakrakandy, Arshad N, MD   1 puff at 09/20/17 2245  . haloperidol lactate (HALDOL) injection 1-2 mg  1-2 mg Intravenous Q6H PRN Dorothea OgleMyers, Iskra M, MD   2 mg at 09/20/17 1000  . HYDROcodone-acetaminophen (NORCO/VICODIN) 5-325 MG per tablet 1-2 tablet  1-2 tablet Oral Q6H PRN Eduard Clos, MD   1 tablet at 09/21/17 1229  . lactated ringers infusion   Intravenous Continuous Dorothea Ogle, MD 75 mL/hr at 09/21/17 [phone]    . LORazepam (ATIVAN) tablet 0.5 mg  0.5 mg Oral BID Eduard Clos, MD   0.5 mg at 09/21/17 0910  . magnesium oxide (MAG-OX) tablet 400 mg  400 mg Oral Daily Eduard Clos, MD   400 mg at 09/21/17 0910  . menthol-cetylpyridinium (CEPACOL) lozenge 3 mg  1  lozenge Oral PRN Aluisio, Homero Fellers, MD       Or  . phenol (CHLORASEPTIC) mouth spray 1 spray  1 spray Mouth/Throat PRN Aluisio, Homero Fellers, MD      . methocarbamol (ROBAXIN) tablet 250 mg  250 mg Oral QHS PRN Eduard Clos, MD      . morphine 4 MG/ML injection 1-2 mg  1-2 mg Intravenous Q2H PRN Dorothea Ogle, MD   2 mg at 09/21/17 0253  . mupirocin ointment (BACTROBAN) 2 % 1 application  1 application Nasal BID Albertine Grates, MD   1 application at 09/21/17 1154  . nitroGLYCERIN (NITROSTAT) SL tablet 0.4 mg  0.4 mg Sublingual Q5 Min x 3 PRN Eduard Clos, MD      . pantoprazole (PROTONIX) EC tablet 40 mg  40 mg Oral BID Eduard Clos, MD   40 mg at 09/21/17 0911  . polyethylene glycol (MIRALAX / GLYCOLAX) packet 17 g  17 g Oral Daily Albertine Grates, MD   17 g at 09/21/17 1204  . pramipexole (MIRAPEX) tablet 0.5 mg  0.5 mg Oral Daily PRN Dorothea Ogle, MD      . pramipexole (MIRAPEX) tablet 1 mg  1 mg Oral QHS Dorothea Ogle, MD   1 mg at 09/20/17 2356  . QUEtiapine (SEROQUEL) tablet 100 mg  100 mg Oral Once per day on Sun Tue Thu Sat Dorothea Ogle, MD   100 mg at 09/19/17 2236  . senna-docusate (Senokot-S) tablet 1 tablet  1 tablet Oral BID Albertine Grates, MD   1 tablet at 09/21/17 1204  . sertraline (ZOLOFT) tablet 25 mg  25 mg Oral QHS Eduard Clos, MD   25 mg at 09/20/17 2301  . sulfamethoxazole-trimethoprim (BACTRIM,SEPTRA) 400-80 MG per tablet 1 tablet  1 tablet Oral Q12H Albertine Grates, MD      . tiotropium [facility]) inhalation capsule 18 mcg  18 mcg Inhalation Daily Eduard Clos, MD   18 mcg at 09/21/17 1051     Discharge Medications: Please see discharge summary for a list of discharge medications.  Relevant Imaging Results:  Relevant Lab Results:   Additional Information SS#: [id number]  Nelwyn Salisbury, LCSW

## 2017-09-21 NOTE — Progress Notes (Signed)
Patient ID: [name], female   DOB: [birth year], 84 y.o.   MRN: [id number]     PROGRESS NOTE  [name]  WUJ:[id number] DOB: 23-Mar-1929 DOA: 09/18/2017  PCP: [name], MD   Brief Narrative:  Pt is 84 yo female, resident of memory care unit due to advanced dementia, combined CHF, COPD, presented to ED after an unwitnessed fall at the facility. Patient has sustained left hip fracture, ortho team consulted and plan for surgery on 01/23. UA with findings consistent with UTI.   Assessment & Plan:   Principal Problem:   Closed left hip fracture, initial encounter (HCC) - s/pLeft hip bipolar hemiarthroplasty - weight bearing status, wound care, DVT prophylaxis per ortho   Active Problems:   Chronic combined systolic and diastolic CHF (congestive heart failure) (HCC) - no evidence of volume overload, d/c ivf - ECHO  25% to 30% - monitor daily weights, strict I/O Filed Weights   09/20/17 0207 09/21/17 0431  Weight: 43.7 kg (96 lb 5.5 oz) 43.1 kg (95 lb 0.3 oz)     Hypertension - somewhat low BP overnight, SBP in 80's - continue hold  Home meds losartan    COPD (chronic obstructive pulmonary disease) (HCC) - respiratory status stable this AM, no hypoxia    ? UTI - no specific concerns but pt with known advanced dementia, she did has mild fever 100.3 on admission, but this could be related to acute hip fracture rather than UTI. urine is cloudy in foley -  urine cultures not obtained on admission -change abx from doxycycline for bactrim, will monitor -remove foley when able to get up    Advanced dementia - mental status at baseline this AM  - at high risk for delirium, had few episodes of agitation yesterday but now more calm - haldol as needed   Body mass index is 17.95 kg/m.    DVT prophylaxis: SCD's, per ortho team Code Status: DNR Family Communication: Patient and daughter at bedside Disposition Plan: SNF  Consultants:   Ortho   Procedures:  Left hip  bipolar hemiarthroplasty by Dr Despina Hick on 1/23    Antimicrobials:   Doxycycline 01/22 -->1/24  Vancomycin 01/22 --> x 1 dose pre op, Staph PCR positive   Bactrim from 1/24 to   Subjective: Post op day one No fever, denies pain at rest She is sitting up in chair foly in place Daughter at bedside  Objective: Vitals:   09/20/17 2116 09/20/17 2245 09/21/17 0431 09/21/17 0700  BP: 120/73  (!) 112/59 (!) 105/54  Pulse:   94 88  Resp: 18  18 20   Temp: 98.2 F (36.8 C)  97.8 F (36.6 C) 98.2 F (36.8 C)  TempSrc: Oral  Oral Oral  SpO2: 96% 96% 95%   Weight:   43.1 kg (95 lb 0.3 oz)   Height:        Intake/Output Summary (Last 24 hours) at 09/21/2017 0818 Last data filed at 09/21/2017 0600 Gross per 24 hour  Intake 1120 ml  Output 615 ml  Net 505 ml   Filed Weights   09/20/17 0207 09/21/17 0431  Weight: 43.7 kg (96 lb 5.5 oz) 43.1 kg (95 lb 0.3 oz)    Examination:  General exam: demented elderly female, thin, Appears calm and comfortable  Respiratory system: Clear to auscultation. Respiratory effort normal. Cardiovascular system: S1 & S2 heard, RRR. No rubs, gallops or clicks.  Gastrointestinal system: Abdomen is nondistended, soft and nontender. No organomegaly or masses felt.  Central nervous system: Alert, follows some commands, baseline dementia  Extremities: left hip post op changes, immobilizer in place Skin: No rashes, lesions or ulcers  Data Reviewed: I have personally reviewed following labs and imaging studies  CBC: Recent Labs  Lab 09/19/17 0113 09/19/17 0535 09/20/17 0428 09/21/17 0421  WBC 7.1 5.3 5.7 9.2  NEUTROABS 5.7  --   --   --   HGB 10.5* 10.4* 10.0* 9.7*  HCT 33.5* 33.5* 31.8* 31.0*  MCV 92.0 93.1 92.7 92.5  PLT 366 346 334 333   Basic Metabolic Panel: Recent Labs  Lab 09/19/17 0113 09/19/17 0535 09/20/17 0428 09/21/17 0421  NA 137 140 138 136  K 3.6 3.7 3.8 4.3  CL 106 106 105 102  CO2 25 28 28 27   GLUCOSE 137* 107* 94 146*   BUN 14 11 9 11   CREATININE 0.55 0.62 0.63 0.67  CALCIUM 8.5* 8.5* 8.1* 8.2*   Coagulation Profile: Recent Labs  Lab 09/19/17 0113  INR 1.15   Urine analysis:    Component Value Date/Time   COLORURINE YELLOW 09/19/2017 0424   APPEARANCEUR HAZY (A) 09/19/2017 0424   LABSPEC 1.015 09/19/2017 0424   PHURINE 8.0 09/19/2017 0424   GLUCOSEU NEGATIVE 09/19/2017 0424   HGBUR NEGATIVE 09/19/2017 0424   BILIRUBINUR NEGATIVE 09/19/2017 0424   KETONESUR NEGATIVE 09/19/2017 0424   PROTEINUR NEGATIVE 09/19/2017 0424   UROBILINOGEN 0.2 04/25/2014 1827   NITRITE NEGATIVE 09/19/2017 0424   LEUKOCYTESUR TRACE (A) 09/19/2017 0424   Recent Results (from the past 240 hour(s))  Surgical pcr screen     Status: Abnormal   Collection Time: 09/19/17  5:51 PM  Result Value Ref Range Status   MRSA, PCR NEGATIVE NEGATIVE Final   Staphylococcus aureus POSITIVE (A) NEGATIVE Final    Comment: (NOTE) The Xpert SA Assay (FDA approved for NASAL specimens in patients 82 years of age and older), is one component of a comprehensive surveillance program. It is not intended to diagnose infection nor to guide or monitor treatment.     Radiology Studies: Dg Chest 1 View Result Date: 09/19/2017 There is no acute cardiopulmonary abnormality. Thoracic aortic atherosclerosis.   Ct Head Wo Contrast Ct Cervical Spine Wo Contrast  Result Date: 09/19/2017 Chronic ischemic microangiopathy without acute intracranial abnormality. No acute fracture of the cervical spine. Status post C4-C7 ACDF with grade 1 C7-T1 anterolisthesis.  Dg Hip Unilat W Or Wo Pelvis 2-3 Views Left Result Date: 09/19/2017 Acute mildly displaced subcapital fracture of the left femoral neck.   Scheduled Meds: . enoxaparin (LOVENOX) injection  30 mg Subcutaneous Q24H  . fentaNYL  25 mcg Transdermal Q72H  . fluticasone furoate-vilanterol  1 puff Inhalation QHS  . LORazepam  0.5 mg Oral BID  . magnesium oxide  400 mg Oral Daily  . morphine       . pantoprazole  40 mg Oral BID  . pramipexole  1 mg Oral QHS  . QUEtiapine  100 mg Oral Once per day on Sun Tue Thu Sat  . sertraline  25 mg Oral QHS  . tiotropium  18 mcg Inhalation Daily   Continuous Infusions: . doxycycline (VIBRAMYCIN) IV Stopped (09/20/17 1318)  . lactated ringers 75 mL/hr at 09/21/17 0641     LOS: 2 days   Time spent: 25 minutes   Albertine GratesFang Amarylis Rovito, MD PhD [facility] Pager [phone]  If 7PM-7AM, please contact night-coverage [email or web address] Password [email or web address] 09/21/2017, 8:18 AM

## 2017-09-22 DIAGNOSIS — E43 Unspecified severe protein-calorie malnutrition: Secondary | ICD-10-CM

## 2017-09-22 LAB — CBC WITH DIFFERENTIAL/PLATELET
Basophils Absolute: 0 10*3/uL (ref 0.0–0.1)
Basophils Relative: 0 %
EOS ABS: 0 10*3/uL (ref 0.0–0.7)
Eosinophils Relative: 1 %
HEMATOCRIT: 26.5 % — AB (ref 36.0–46.0)
HEMOGLOBIN: 8.2 g/dL — AB (ref 12.0–15.0)
LYMPHS ABS: 1 10*3/uL (ref 0.7–4.0)
LYMPHS PCT: 13 %
MCH: 28.8 pg (ref 26.0–34.0)
MCHC: 30.9 g/dL (ref 30.0–36.0)
MCV: 93 fL (ref 78.0–100.0)
MONOS PCT: 6 %
Monocytes Absolute: 0.5 10*3/uL (ref 0.1–1.0)
NEUTROS ABS: 6.2 10*3/uL (ref 1.7–7.7)
NEUTROS PCT: 80 %
Platelets: 316 10*3/uL (ref 150–400)
RBC: 2.85 MIL/uL — ABNORMAL LOW (ref 3.87–5.11)
RDW: 14.2 % (ref 11.5–15.5)
WBC: 7.7 10*3/uL (ref 4.0–10.5)

## 2017-09-22 LAB — BASIC METABOLIC PANEL
Anion gap: 7 (ref 5–15)
BUN: 16 mg/dL (ref 6–20)
CHLORIDE: 102 mmol/L (ref 101–111)
CO2: 27 mmol/L (ref 22–32)
CREATININE: 0.8 mg/dL (ref 0.44–1.00)
Calcium: 8.2 mg/dL — ABNORMAL LOW (ref 8.9–10.3)
GFR calc Af Amer: >60 mL/min (ref >60)
GFR calc non Af Amer: >60 mL/min (ref >60)
GLUCOSE: 92 mg/dL (ref 65–99)
Potassium: 4.2 mmol/L (ref 3.5–5.1)
Sodium: 136 mmol/L (ref 135–145)

## 2017-09-22 LAB — MAGNESIUM: Magnesium: 1.8 mg/dL (ref 1.7–2.4)

## 2017-09-22 MED ORDER — POLYETHYLENE GLYCOL 3350 17 G PO PACK: 17.0000 g | PACK | Freq: Every day | ORAL | 0 refills | Status: AC

## 2017-09-22 MED ORDER — BISACODYL 10 MG RE SUPP
10.0000 mg | Freq: Every day | RECTAL | Status: DC
  Filled 2017-09-22: qty 1

## 2017-09-22 MED ORDER — TAMSULOSIN HCL 0.4 MG PO CAPS
0.4000 mg | ORAL_CAPSULE | Freq: Every day | ORAL | Status: DC
  Administered 2017-09-22 – 2017-09-23 (×2): 0.4 mg via ORAL
  Filled 2017-09-22 (×2): qty 1

## 2017-09-22 MED ORDER — SENNOSIDES-DOCUSATE SODIUM 8.6-50 MG PO TABS: 1.0000 | ORAL_TABLET | Freq: Every day | ORAL | 0 refills | Status: DC

## 2017-09-22 MED ORDER — ENOXAPARIN SODIUM 30 MG/0.3ML ~~LOC~~ SOLN: 30.0000 mg | SUBCUTANEOUS | 0 refills | Status: DC

## 2017-09-22 MED ORDER — SULFAMETHOXAZOLE-TRIMETHOPRIM 400-80 MG PO TABS: 1.0000 | ORAL_TABLET | Freq: Two times a day (BID) | ORAL | 0 refills | Status: AC

## 2017-09-22 MED ORDER — TRAMADOL HCL 50 MG PO TABS: 50.0000 mg | ORAL_TABLET | Freq: Four times a day (QID) | ORAL | 0 refills | Status: DC | PRN

## 2017-09-22 MED ORDER — HYDROCODONE-ACETAMINOPHEN 5-325 MG PO TABS: 1.0000 | ORAL_TABLET | Freq: Four times a day (QID) | ORAL | 0 refills | Status: DC | PRN

## 2017-09-22 MED ORDER — METHOCARBAMOL 500 MG PO TABS: 250.0000 mg | ORAL_TABLET | Freq: Every evening | ORAL | 0 refills | Status: AC | PRN

## 2017-09-22 NOTE — Progress Notes (Signed)
LATE ENTRY NOTE Date of Service of Visit - 09/21/2017 - Thursday    Subjective: 1 Days Post-Op Procedure(s) (LRB): ARTHROPLASTY BIPOLAR HIP (HEMIARTHROPLASTY) LEFT (Left) Patient reports pain as mild.   Patient seen in rounds for Dr. Lequita HaltAluisio.  Daughter in room at bedside.  Patient is well, but has had some minor complaints of pain in the hip and leg, requiring pain medications Just finished working with therapy and got up to the chair at this time. Plan is to go Skilled nursing facility after hospital stay.  Objective: Vital signs in last 24 hours: Temp:  989.2 Pulse Rate: 69 Resp: 20 BP: 105/54  Intake/Output this shift: No intake/output data recorded.  Labs: Recent Labs    09/20/17 0428 09/21/17 0421   HGB 10.0* 9.7*    Recent Labs    09/21/17 0421   WBC 9.2   RBC 3.35*   HCT 31.0*   PLT 333    Recent Labs    09/21/17 0421   NA 136   K 4.3   CL 102   CO2 27   BUN 11   CREATININE 0.67   GLUCOSE 146*   CALCIUM 8.2*    No results for input(s): LABPT, INR in the last 72 hours.  EXAM General - Patient is Alert and Confused Extremity - Neurovascular intact Sensation intact distally Dressing - dressing C/D/I Motor Function - intact, moving foot and toes well on exam.  Hemovac pulled without difficulty.  Past Medical History:  Diagnosis Date  . Abnormal cardiovascular stress test 01/17/2013   Decreased LV function with mild distal anterior perfusion abnormality   . Acute combined systolic and diastolic heart failure (HCC) 01/17/2013  . Chronic combined systolic and diastolic CHF (congestive heart failure) (HCC) 08/17/2017  . COPD (chronic obstructive pulmonary disease) (HCC)   . Hypertension   . Restless leg syndrome   . Right lower lobe pneumonia (HCC) 08/17/2017  . Sepsis (HCC) 08/17/2017  . Tobacco use disorder   . WPW (Wolff-Parkinson-White syndrome)     Assessment/Plan: 2 Days Post-Op Procedure(s) (LRB): ARTHROPLASTY BIPOLAR HIP  (HEMIARTHROPLASTY) LEFT (Left) Principal Problem:   Closed left hip fracture, initial encounter (HCC) Active Problems:   WPW (Wolff-Parkinson-White syndrome)   Chronic combined systolic and diastolic CHF (congestive heart failure) (HCC)   Hypertension   COPD (chronic obstructive pulmonary disease) (HCC)   Hip fracture (HCC)  Estimated body mass index is 18.7 kg/m as calculated from the following:   Height as of this encounter: 5\' 1"  (1.549 m).   Weight as of this encounter: 44.9 kg (98 lb 15.8 oz). Up with therapy  DVT Prophylaxis - Lovenox Weight Bearing As Tolerated left Leg D/C Knee Immobilizer Hemovac Pulled Begin Therapy Hip Preacutions  Avel Peacerew Hill Mackie, PA-C Orthopaedic Surgery 09/22/2017, 8:55 AM

## 2017-09-22 NOTE — Anesthesia Postprocedure Evaluation (Signed)
Anesthesia Post Note  Patient: [name]  Procedure(s) Performed: ARTHROPLASTY BIPOLAR HIP (HEMIARTHROPLASTY) LEFT (Left )     Patient location during evaluation: PACU Anesthesia Type: General Level of consciousness: awake and alert Pain management: pain level controlled Vital Signs Assessment: post-procedure vital signs reviewed and stable Respiratory status: spontaneous breathing, nonlabored ventilation, respiratory function stable and patient connected to nasal cannula oxygen Cardiovascular status: blood pressure returned to baseline and stable Postop Assessment: no apparent nausea or vomiting Anesthetic complications: no    Last Vitals:  Vitals:   09/21/17 2121 09/22/17 0453  BP:  (!) 106/51  Pulse:  96  Resp:  19  Temp:  37 C  SpO2: 96% 97%    Last Pain:  Vitals:   09/22/17 0453  TempSrc: Oral  PainSc:                  Kartier Bennison S

## 2017-09-22 NOTE — Progress Notes (Addendum)
Pt going to SNF, CSW following.  

## 2017-09-22 NOTE — Progress Notes (Signed)
   Subjective: 2 Days Post-Op Procedure(s) (LRB): ARTHROPLASTY BIPOLAR HIP (HEMIARTHROPLASTY) LEFT (Left) Patient quite sedated at time of rounds.  Sleeping but arousable.   Patient seen in rounds for Dr. Lequita HaltAluisio.  Received Hydrocodone and Lorazepam last night. Patient does not appear to be in any distress. Plan is to go Skilled nursing facility after hospital stay.  Objective: Vital signs in last 24 hours: Temp:  [97.5 F (36.4 C)-99.2 F (37.3 C)] 98.6 F (37 C) (01/25 0453) Pulse Rate:  [89-101] 96 (01/25 0453) Resp:  [19-20] 19 (01/25 0453) BP: (98-106)/(41-60) 106/51 (01/25 0453) SpO2:  [90 %-97 %] 97 % (01/25 0453) Weight:  [44.9 kg (98 lb 15.8 oz)] 44.9 kg (98 lb 15.8 oz) (01/25 0457)  Intake/Output from previous day:  Intake/Output Summary (Last 24 hours) at 09/22/2017 0901 Last data filed at 09/22/2017 0459 Gross per 24 hour  Intake 120 ml  Output 825 ml  Net -705 ml    Intake/Output this shift: No intake/output data recorded.  Labs: Recent Labs    09/20/17 0428 09/21/17 0421 09/22/17 0417  HGB 10.0* 9.7* 8.2*   Recent Labs    09/21/17 0421 09/22/17 0417  WBC 9.2 7.7  RBC 3.35* 2.85*  HCT 31.0* 26.5*  PLT 333 316   Recent Labs    09/21/17 0421 09/22/17 0417  NA 136 136  K 4.3 4.2  CL 102 102  CO2 27 27  BUN 11 16  CREATININE 0.67 0.80  GLUCOSE 146* 92  CALCIUM 8.2* 8.2*   No results for input(s): LABPT, INR in the last 72 hours.  EXAM General - Patient is sleepy but arousable on exam Extremity - Neurovascular intact Sensation intact distally Intact pulses distally Dressing/Incision - clean, dry, no drainage Motor Function - intact, moving foot and toes to stimulus  Past Medical History:  Diagnosis Date  . Abnormal cardiovascular stress test 01/17/2013   Decreased LV function with mild distal anterior perfusion abnormality   . Acute combined systolic and diastolic heart failure (HCC) 01/17/2013  . Chronic combined systolic and  diastolic CHF (congestive heart failure) (HCC) 08/17/2017  . COPD (chronic obstructive pulmonary disease) (HCC)   . Hypertension   . Restless leg syndrome   . Right lower lobe pneumonia (HCC) 08/17/2017  . Sepsis (HCC) 08/17/2017  . Tobacco use disorder   . WPW (Wolff-Parkinson-White syndrome)     Assessment/Plan: 2 Days Post-Op Procedure(s) (LRB): ARTHROPLASTY BIPOLAR HIP (HEMIARTHROPLASTY) LEFT (Left) Principal Problem:   Closed left hip fracture, initial encounter (HCC) Active Problems:   WPW (Wolff-Parkinson-White syndrome)   Chronic combined systolic and diastolic CHF (congestive heart failure) (HCC)   Hypertension   COPD (chronic obstructive pulmonary disease) (HCC)   Hip fracture (HCC)  Estimated body mass index is 18.7 kg/m as calculated from the following:   Height as of this encounter: 5\' 1"  (1.549 m).   Weight as of this encounter: 44.9 kg (98 lb 15.8 oz). Up with therapy  DVT Prophylaxis - Lovenox Weight Bearing As Tolerated left Leg  Can DC knee immobilizer Please change dressing when up to chair.  Avel Peacerew Perkins, PA-C Orthopaedic Surgery 09/22/2017, 9:01 AM

## 2017-09-22 NOTE — Progress Notes (Addendum)
Patient ID: [name], female   DOB: 08-05-1929, 84 y.o.   MRN: [id number]     PROGRESS NOTE  [name]  VQQ:[id number] DOB: 06-02-1929 DOA: 09/18/2017  PCP: [name], MD   Brief Narrative:  Pt is 84 yo female, resident of memory care unit due to advanced dementia, combined CHF, COPD, presented to ED after an unwitnessed fall at the facility. Patient has sustained left hip fracture, ortho team consulted and plan for surgery on 01/23. UA with findings consistent with UTI.   Assessment & Plan:   Principal Problem:   Closed left hip fracture, initial encounter (HCC) - s/pLeft hip bipolar hemiarthroplasty - weight bearing status, wound care, DVT prophylaxis per ortho  -snf placement     Chronic combined systolic and diastolic CHF (congestive heart failure) (HCC), on chronic home o2 - no evidence of volume overload, d/c ivf - ECHO  25% to 30% - monitor daily weights, strict I/O Filed Weights   09/20/17 0207 09/21/17 0431 09/22/17 0457  Weight: 43.7 kg (96 lb 5.5 oz) 43.1 kg (95 lb 0.3 oz) 44.9 kg (98 lb 15.8 oz)     Hypertension -  SBP in 80's initially on presentation - continue hold  Home meds losartan -bp improving    COPD (chronic obstructive pulmonary disease) (HCC), home o2 dependent - respiratory status stable, no wheezing, no cough, no hypoxia    ? UTI - no specific concerns but pt with known advanced dementia, she did has mild fever 100.3 on admission, but this could be related to acute hip fracture rather than UTI. urine is cloudy in foley -  urine cultures not obtained on admission -change abx from doxycycline for bactrim, will monitor -urinary retention after foley removal, start flomax, reinsert foley, avoid narcotics if possible, avoid constipation.    Advanced dementia -  at high risk for delirium, had few episodes of agitation - haldol as needed    Chronic pain, have been on fentanyl patch for many yrs, continued.  Severe malnutrition in  context of chronic illness Body mass index is 18.7 kg/m. From November to this admission pt shows a 7% wt loss in two months. Significant for time frame.  Nutrition consulted, input appreciated    DVT prophylaxis: SCD's, per ortho team Code Status: DNR Family Communication: Patient and daughter at bedside Disposition Plan: SNF on 1/25  Consultants:   Ortho   Procedures:  Left hip bipolar hemiarthroplasty by Dr Despina Hick on 1/23    Antimicrobials:   Doxycycline 01/22 -->1/24  Vancomycin 01/22 --> x 1 dose pre op, Staph PCR positive   Bactrim from 1/24 to   Subjective: Post op day two She is sedated this am No fever, Daughter at bedside  Objective: Vitals:   09/22/17 0453 09/22/17 0457 09/22/17 1103 09/22/17 1336  BP: (!) 106/51   (!) 114/53  Pulse: 96   (!) 102  Resp: 19   16  Temp: 98.6 F (37 C)   99.6 F (37.6 C)  TempSrc: Oral   Oral  SpO2: 97%  96% 95%  Weight:  44.9 kg (98 lb 15.8 oz)    Height:        Intake/Output Summary (Last 24 hours) at 09/22/2017 1747 Last data filed at 09/22/2017 1103 Gross per 24 hour  Intake 60 ml  Output 425 ml  Net -365 ml   Filed Weights   09/20/17 0207 09/21/17 0431 09/22/17 0457  Weight: 43.7 kg (96 lb 5.5 oz) 43.1 kg (95 lb  0.3 oz) 44.9 kg (98 lb 15.8 oz)    Examination:  General exam: demented elderly female, thin, sedated this am Respiratory system: Clear to auscultation. Respiratory effort normal. Cardiovascular system: S1 & S2 heard, RRR. No rubs, gallops or clicks.  Gastrointestinal system: Abdomen is nondistended, soft and nontender. No organomegaly or masses felt.  Central nervous system: Alert, follows some commands, baseline dementia  Extremities: left hip post op changes, immobilizer in place Skin: No rashes, lesions or ulcers  Data Reviewed: I have personally reviewed following labs and imaging studies  CBC: Recent Labs  Lab 09/19/17 0113 09/19/17 0535 09/20/17 0428 09/21/17 0421  09/22/17 0417  WBC 7.1 5.3 5.7 9.2 7.7  NEUTROABS 5.7  --   --   --  6.2  HGB 10.5* 10.4* 10.0* 9.7* 8.2*  HCT 33.5* 33.5* 31.8* 31.0* 26.5*  MCV 92.0 93.1 92.7 92.5 93.0  PLT 366 346 334 333 316   Basic Metabolic Panel: Recent Labs  Lab 09/19/17 0113 09/19/17 0535 09/20/17 0428 09/21/17 0421 09/22/17 0417  NA 137 140 138 136 136  K 3.6 3.7 3.8 4.3 4.2  CL 106 106 105 102 102  CO2 25 28 28 27 27   GLUCOSE 137* 107* 94 146* 92  BUN 14 11 9 11 16   CREATININE 0.55 0.62 0.63 0.67 0.80  CALCIUM 8.5* 8.5* 8.1* 8.2* 8.2*  MG  --   --   --   --  1.8   Coagulation Profile: Recent Labs  Lab 09/19/17 0113  INR 1.15   Urine analysis:    Component Value Date/Time   COLORURINE YELLOW 09/19/2017 0424   APPEARANCEUR HAZY (A) 09/19/2017 0424   LABSPEC 1.015 09/19/2017 0424   PHURINE 8.0 09/19/2017 0424   GLUCOSEU NEGATIVE 09/19/2017 0424   HGBUR NEGATIVE 09/19/2017 0424   BILIRUBINUR NEGATIVE 09/19/2017 0424   KETONESUR NEGATIVE 09/19/2017 0424   PROTEINUR NEGATIVE 09/19/2017 0424   UROBILINOGEN 0.2 04/25/2014 1827   NITRITE NEGATIVE 09/19/2017 0424   LEUKOCYTESUR TRACE (A) 09/19/2017 0424   Recent Results (from the past 240 hour(s))  Surgical pcr screen     Status: Abnormal   Collection Time: 09/19/17  5:51 PM  Result Value Ref Range Status   MRSA, PCR NEGATIVE NEGATIVE Final   Staphylococcus aureus POSITIVE (A) NEGATIVE Final    Comment: (NOTE) The Xpert SA Assay (FDA approved for NASAL specimens in patients 48 years of age and older), is one component of a comprehensive surveillance program. It is not intended to diagnose infection nor to guide or monitor treatment.   Culture, Urine     Status: None   Collection Time: 09/20/17 10:15 AM  Result Value Ref Range Status   Specimen Description URINE, RANDOM  Final   Special Requests NONE  Final   Culture   Final    NO GROWTH Performed at [facility], [address] [place], [place] [address]    Report Status  09/21/2017 FINAL  Final    Radiology Studies: Dg Chest 1 View Result Date: 09/19/2017 There is no acute cardiopulmonary abnormality. Thoracic aortic atherosclerosis.   Ct Head Wo Contrast Ct Cervical Spine Wo Contrast  Result Date: 09/19/2017 Chronic ischemic microangiopathy without acute intracranial abnormality. No acute fracture of the cervical spine. Status post C4-C7 ACDF with grade 1 C7-T1 anterolisthesis.  Dg Hip Unilat W Or Wo Pelvis 2-3 Views Left Result Date: 09/19/2017 Acute mildly displaced subcapital fracture of the left femoral neck.   Scheduled Meds: . bisacodyl  10  mg Rectal Daily  . Chlorhexidine Gluconate Cloth  6 each Topical Daily  . enoxaparin (LOVENOX) injection  30 mg Subcutaneous Q24H  . fentaNYL  25 mcg Transdermal Q72H  . fluticasone furoate-vilanterol  1 puff Inhalation QHS  . LORazepam  0.5 mg Oral BID  . magnesium oxide  400 mg Oral Daily  . mupirocin ointment  1 application Nasal BID  . pantoprazole  40 mg Oral BID  . polyethylene glycol  17 g Oral Daily  . pramipexole  1 mg Oral QHS  . QUEtiapine  100 mg Oral Once per day on Sun Tue Thu Sat  . senna-docusate  1 tablet Oral BID  . sertraline  25 mg Oral QHS  . sulfamethoxazole-trimethoprim  1 tablet Oral Q12H  . tamsulosin  0.4 mg Oral Daily  . tiotropium  18 mcg Inhalation Daily   Continuous Infusions:    LOS: 3 days   Time spent: 25 minutes   Albertine GratesFang Cavion Faiola, MD PhD [facility] Pager [phone]  If 7PM-7AM, please contact night-coverage [email or web address] Password [email or web address] 09/22/2017, 5:47 PM

## 2017-09-22 NOTE — Discharge Instructions (Signed)
Hip Fracture A hip fracture is a fracture of the upper part of your thigh bone (femur). What are the causes? A hip fracture is caused by a direct blow to the side of your hip. This is usually the result of a fall but can occur in other circumstances, such as an automobile accident. What increases the risk? There is an increased risk of hip fractures in people with:  An unsteady walking pattern (gait) and those with conditions that contribute to poor balance, such as Parkinson's disease or dementia.  Osteopenia and osteoporosis.  Cancer that spreads to the leg bones.  Certain metabolic diseases.  What are the signs or symptoms? Symptoms of hip fracture include:  Pain over the injured hip.  Inability to put weight on the leg in which the fracture occurred (although, some patients are able to walk after a hip fracture).  Toes and foot of the affected leg point outward when you lie down.  How is this diagnosed? A physical exam can determine if a hip fracture is likely to have occurred. X-ray exams are needed to confirm the fracture and to look for other injuries. The X-ray exam can help to determine the type of hip fracture. Rarely, the fracture is not visible on an X-ray image and a CT scan or MRI will have to be done. How is this treated? The treatment for a fracture is usually surgery. This means using a screw, nail, or rod to hold the bones in place. Follow these instructions at home: Take all medicines as directed by your health care provider. Contact a health care provider if: Pain continues, even after taking pain medicine. This information is not intended to replace advice given to you by your health care provider. Make sure you discuss any questions you have with your health care provider. Document Released: 08/15/2005 Document Revised: 01/21/2016 Document Reviewed: 03/27/2013 Elsevier Interactive Patient Education  2017 [employer]  Take Lovenox Injections daily for ten  days, then discontinue the injections and resume the baby 81 mg Aspirin daily at home.  Pick up stool softner and laxative for home. Do not submerge incision under water. May shower after three days following surgery. Continue to use ice for pain and swelling from surgery. Hip precautions.

## 2017-09-22 NOTE — Care Management Important Message (Signed)
Important Message  Patient Details  Name: Deborah MoronBetty B Caponigro MRN: [id number] Date of Birth: 1929-04-17   Medicare Important Message Given:  Yes    Caren MacadamFuller, Akash Winski 09/22/2017, 10:37 AMImportant Message  Patient Details  Name: Deborah MoronBetty B [name] MRN: [id number] Date of Birth: 1929-04-17   Medicare Important Message Given:  Yes    Caren MacadamFuller, Amerah Puleo 09/22/2017, 10:37 AM

## 2017-09-22 NOTE — Progress Notes (Signed)
Pt's daughter has selected [facility] for short term rehab at [place]. [facility]. [facility] obtained insurance authorization. Pt's daughter aware that pt will be in copay days soon after admission as pt was recently in SNF at [facility]. Agreeable and prepared for copay. Pt will need [facility] transport at [place].   Plan[facility]  Ilean SkillMeghan Rollyn Scialdone, MSW, LCSW Clinical Social Work 09/22/2017 [phone]

## 2017-09-22 NOTE — Progress Notes (Signed)
Bladder scanned urine. Attempted to get pt up OOB to bsc. Pt refuses and becoming agitated

## 2017-09-23 MED ORDER — LORAZEPAM 1 MG PO TABS: 5.0000 mg | ORAL_TABLET | Freq: Two times a day (BID) | ORAL | 0 refills | Status: DC

## 2017-09-23 MED ORDER — BISACODYL 10 MG RE SUPP: 10.0000 mg | Freq: Every day | RECTAL | 0 refills | Status: AC

## 2017-09-23 MED ORDER — FENTANYL 25 MCG/HR TD PT72: 25.0000 ug | MEDICATED_PATCH | TRANSDERMAL | 0 refills | Status: AC

## 2017-09-23 MED ORDER — TAMSULOSIN HCL 0.4 MG PO CAPS: 0.4000 mg | ORAL_CAPSULE | Freq: Every day | ORAL | 0 refills | Status: DC

## 2017-09-23 MED ORDER — LORAZEPAM 0.5 MG PO TABS: 0.5000 mg | ORAL_TABLET | Freq: Two times a day (BID) | ORAL | 0 refills | Status: DC | PRN

## 2017-09-23 NOTE — Progress Notes (Signed)
Dr Shelle IronBeane in with pt. Pt is agitated and trying to get out of bed. Per Dr, leave immobilizer on because pt non compliant with hip precautions

## 2017-09-23 NOTE — Progress Notes (Signed)
[facility].

## 2017-09-23 NOTE — Discharge Summary (Addendum)
Discharge Summary  [name]:[id number] DOB: 08-03-1929  PCP: [name], MD  Admit date: 09/18/2017 Discharge date: 09/23/2017  Time spent: >80mins, more than 50% time spent on coordination of care.  Recommendations for Outpatient Follow-up:  1. F/u with SNF MD  for hospital discharge follow up, repeat cbc/bmp at follow up 2. F/u with orthopedics Dr Lequita Halt  Discharge Diagnoses:  Active Hospital Problems   Diagnosis Date Noted  . Closed left hip fracture, initial encounter (HCC) 09/19/2017  . Protein-calorie malnutrition, severe 09/22/2017  . Hip fracture (HCC) 09/19/2017  . Hypertension 08/26/2017  . COPD (chronic obstructive pulmonary disease) (HCC) 08/26/2017  . Chronic combined systolic and diastolic CHF (congestive heart failure) (HCC) 08/17/2017  . WPW (Wolff-Parkinson-White syndrome)     Resolved Hospital Problems  No resolved problems to display.    Discharge Condition: stable  Diet recommendation: heart healthy  Filed Weights   09/21/17 0431 09/22/17 0457 09/23/17 0409  Weight: 43.1 kg (95 lb 0.3 oz) 44.9 kg (98 lb 15.8 oz) 41.2 kg (90 lb 13.3 oz)    History of present illness:  PCP: [name], MD  Patient coming from: Skilled nursing facility.  Chief Complaint: Fall.  HPI: [name] is a 84 y.o. female with history of dementia, chronic combined CHF COPD and depression was brought to the ER after patient had an unwitnessed fall at the nursing facility.  No further information is available.  ED Course: In the ER patient is complaining of hip pain and x-rays revealed left hip fracture and Dr. Berton Lan on-call orthopedic surgeon was consulted.  Patient is only complaining of generalized body ache but is not in any respiratory distress.  UA showing features possibility of UTI.    Hospital Course:  Principal Problem:   Closed left hip fracture, initial encounter (HCC) Active Problems:   WPW (Wolff-Parkinson-White syndrome)   Chronic  combined systolic and diastolic CHF (congestive heart failure) (HCC)   Hypertension   COPD (chronic obstructive pulmonary disease) (HCC)   Hip fracture (HCC)   Protein-calorie malnutrition, severe  Principal Problem:   Closed left hip fracture, initial encounter (HCC) - s/pLefthip bipolar hemiarthroplasty - weight bearing status, wound care, DVT prophylaxis per ortho  -snf placement     Chronic combined systolic and diastolic CHF (congestive heart failure) (HCC), on chronic home o2 - no evidence of volume overload, d/c ivf - ECHO  25% to 30% - monitor daily weights, strict I/O      Filed Weights   09/20/17 0207 09/21/17 0431 09/22/17 0457  Weight: 43.7 kg (96 lb 5.5 oz) 43.1 kg (95 lb 0.3 oz) 44.9 kg (98 lb 15.8 oz)     Hypertension -  SBP in 80's initially on presentation - continue hold  Home meds losartan -bp improving, snf MD to monitor blood pressure, resume losartan when appropriate    COPD (chronic obstructive pulmonary disease) (HCC), home o2 dependent - respiratory status stable, no wheezing, no cough, no hypoxia    ? UTI - no specific concerns but pt with known advanced dementia, she did has mild fever 100.3 on admission, but this could be related to acute hip fracture rather than UTI. urine is cloudy in foley -  urine cultures not obtained on admission -change abx from doxycycline for bactrim,  - start flomax,  avoid narcotics if possible, avoid constipation. Avoid urinary retention.    Advanced dementia -  at high risk for delirium, had few episodes of agitation - on seroquel qhs and ativan  and haldol as needed at home   Chronic pain, have been on fentanyl patch for many yrs, continued.  Severe malnutrition in context of chronic illness Body mass index is 18.7 kg/m. From November to this admission pt shows a 7% wt loss in two months. Significant for time frame.  Nutrition consulted, input appreciated    DVT prophylaxis: SCD's, per ortho  team Code Status: DNR Family Communication: Patient and daughter at bedside Disposition Plan: SNF on 1/26  Consultants:   Ortho   Procedures:  Lefthip bipolar hemiarthroplasty by Dr Despina Hick on 1/23    Antimicrobials:   Doxycycline 01/22 -->1/24  Vancomycin 01/22 --> x 1 dose pre op, Staph PCR positive   Bactrim from 1/24 to    Discharge Exam: BP (!) 104/49 (BP Location: Right Arm)   Pulse 90   Temp 99.2 F (37.3 C) (Oral)   Resp 18   Ht 5\' 1"  (1.549 m)   Wt 41.2 kg (90 lb 13.3 oz)   SpO2 94%   BMI 17.16 kg/m   General: demented elderly, thin Cardiovascular: RRR Respiratory: CTAABL Extremities: left hip post op changes, immobilizer in place   Discharge Instructions You were cared for by a hospitalist during your hospital stay. If you have any questions about your discharge medications or the care you received while you were in the hospital after you are discharged, you can call the unit and asked to speak with the hospitalist on call if the hospitalist that took care of you is not available. Once you are discharged, your primary care physician will handle any further medical issues. Please note that NO REFILLS for any discharge medications will be authorized once you are discharged, as it is imperative that you return to your primary care physician (or establish a relationship with a primary care physician if you do not have one) for your aftercare needs so that they can reassess your need for medications and monitor your lab values.  Discharge Instructions    Call MD / Call 911   Complete by:  As directed    If you experience chest pain or shortness of breath, CALL 911 and be transported to the hospital emergency room.  If you develope a fever above 101 F, pus (white drainage) or increased drainage or redness at the wound, or calf pain, call your surgeon's office.   Change dressing   Complete by:  As directed    You may change your dressing dressing daily with  sterile 4 x 4 inch gauze dressing and paper tape.  Do not submerge the incision under water.   Diet - low sodium heart healthy   Complete by:  As directed    Diet - low sodium heart healthy   Complete by:  As directed    Discharge instructions   Complete by:  As directed    Take Lovenox injections daly for ten days, then discontinue the injections and resume the baby 81 mg Aspirin daily at home.  Pick up stool softner and laxative for home use following surgery while on pain medications. Do not submerge incision under water. Please use good hand washing techniques while changing dressing each day. May shower starting three days after surgery. Please use a clean towel to pat the incision dry following showers. Continue to use ice for pain and swelling after surgery. Do not use any lotions or creams on the incision until instructed by your surgeon.  Wear both TED hose on both legs during the day every  day for three weeks, but may remove the TED hose at night at home.  Postoperative Constipation Protocol  Constipation - defined medically as fewer than three stools per week and severe constipation as less than one stool per week.  One of the most common issues patients have following surgery is constipation.  Even if you have a regular bowel pattern at home, your normal regimen is likely to be disrupted due to multiple reasons following surgery.  Combination of anesthesia, postoperative narcotics, change in appetite and fluid intake all can affect your bowels.  In order to avoid complications following surgery, here are some recommendations in order to help you during your recovery period.  Colace (docusate) - Pick up an over-the-counter form of Colace or another stool softener and take twice a day as long as you are requiring postoperative pain medications.  Take with a full glass of water daily.  If you experience loose stools or diarrhea, hold the colace until you stool forms back up.  If  your symptoms do not get better within 1 week or if they get worse, check with your doctor.  Dulcolax (bisacodyl) - Pick up over-the-counter and take as directed by the product packaging as needed to assist with the movement of your bowels.  Take with a full glass of water.  Use this product as needed if not relieved by Colace only.   MiraLax (polyethylene glycol) - Pick up over-the-counter to have on hand.  MiraLax is a solution that will increase the amount of water in your bowels to assist with bowel movements.  Take as directed and can mix with a glass of water, juice, soda, coffee, or tea.  Take if you go more than two days without a movement. Do not use MiraLax more than once per day. Call your doctor if you are still constipated or irregular after using this medication for 7 days in a row.  If you continue to have problems with postoperative constipation, please contact the office for further assistance and recommendations.  If you experience "the worst abdominal pain ever" or develop nausea or vomiting, please contact the office immediatly for further recommendations for treatment.   Do not sit on low chairs, stoools or toilet seats, as it may be difficult to get up from low surfaces   Complete by:  As directed    Driving restrictions   Complete by:  As directed    No driving until released by the physician.   Follow the hip precautions as taught in Physical Therapy   Complete by:  As directed    Increase activity slowly   Complete by:  As directed    Increase activity slowly   Complete by:  As directed    Increase activity slowly as tolerated   Complete by:  As directed    Lifting restrictions   Complete by:  As directed    No lifting until released by the physician.   Patient may shower   Complete by:  As directed    You may shower without a dressing once there is no drainage.  Do not wash over the wound.  If drainage remains, do not shower until drainage stops.   TED hose    Complete by:  As directed    Use stockings (TED hose) for 3 weeks on both leg(s).  You may remove them at night for sleeping.   Weight bearing as tolerated   Complete by:  As directed    Laterality:  left  Extremity:  Lower     Allergies as of 09/23/2017      Reactions   Amoxicillin Anaphylaxis, Rash   Ciprofloxacin Anaphylaxis, Other (See Comments)   Thrush   Lorabid [loracarbef] Anaphylaxis, Rash   Avelox [moxifloxacin] Other (See Comments)   Tendon and joint pain   Erythromycin Other (See Comments)   angioedema   Inderal [propranolol] Other (See Comments)   Vasculitis   Chantix [varenicline] Nausea Only      Medication List    STOP taking these medications   aspirin 81 MG EC tablet   losartan 50 MG tablet Commonly known as:  COZAAR     TAKE these medications   acetaminophen 500 MG tablet Commonly known as:  TYLENOL Take 500 mg by mouth every 6 (six) hours as needed for moderate pain.   albuterol (2.5 MG/3ML) 0.083% nebulizer solution Commonly known as:  PROVENTIL Take 2.5 mg by nebulization 3 (three) times daily as needed for wheezing or shortness of breath.   B Complex-B12 Tabs Take 1 tablet by mouth daily.   bisacodyl 10 MG suppository Commonly known as:  DULCOLAX Place 1 suppository (10 mg total) rectally daily for 3 days.   BREO ELLIPTA 200-25 MCG/INH Aepb Generic drug:  fluticasone furoate-vilanterol Inhale 1 puff into the lungs at bedtime.   cyanocobalamin 100 MCG tablet Take 1 tablet (100 mcg total) by mouth daily.   enoxaparin 30 MG/0.3ML injection Commonly known as:  LOVENOX Inject 0.3 mLs (30 mg total) into the skin daily. Take Lovenox injections daily for ten days, then discontinue and resume the home baby 81 mg Aspirin daily.   fentaNYL 25 MCG/HR patch Commonly known as:  DURAGESIC - dosed mcg/hr Place 1 patch (25 mcg total) onto the skin every 3 (three) days.   haloperidol 0.5 MG tablet Commonly known as:  HALDOL Take 1 tablet (0.5 mg  total) by mouth every 6 (six) hours as needed for agitation. What changed:  additional instructions   HYDROcodone-acetaminophen 5-325 MG tablet Commonly known as:  NORCO/VICODIN Take 1 tablet by mouth every 6 (six) hours as needed for moderate pain.   LORazepam 0.5 MG tablet Commonly known as:  ATIVAN Take 1 tablet (0.5 mg total) by mouth 2 (two) times daily as needed for anxiety. What changed:    medication strength  when to take this  reasons to take this   Magnesium 250 MG Tabs Take 1 tablet by mouth daily.   methocarbamol 500 MG tablet Commonly known as:  ROBAXIN Take 0.5 tablets (250 mg total) by mouth at bedtime as needed. What changed:  reasons to take this   nitroGLYCERIN 0.4 MG SL tablet Commonly known as:  NITROSTAT Place 1 tablet (0.4 mg total) under the tongue every 5 (five) minutes x 3 doses as needed for chest pain.   OXYGEN Inhale into the lungs. CONTINUE PRN OXYGEN AT 1 LITERS/NASAL CANNULA FOR OXYGEN SATURATION 87% OR BELOW   pantoprazole 40 MG tablet Commonly known as:  PROTONIX Take 40 mg by mouth 2 (two) times daily.   polyethylene glycol packet Commonly known as:  MIRALAX / GLYCOLAX Take 17 g by mouth daily. What changed:    when to take this  reasons to take this   pramipexole 1 MG tablet Commonly known as:  MIRAPEX Take 0.5-1 mg by mouth 2 (two) times daily. 0.5 mg during the day prn and 1 mg at bedtime   QUEtiapine 100 MG tablet Commonly known as:  SEROQUEL Take 1 tablet (100 mg  total) by mouth at bedtime. Only gives 100 mg four times a week What changed:  additional instructions   senna-docusate 8.6-50 MG tablet Commonly known as:  Senokot-S Take 1 tablet by mouth at bedtime.   sertraline 25 MG tablet Commonly known as:  ZOLOFT Take 1 tablet (25 mg total) by mouth at bedtime.   sulfamethoxazole-trimethoprim 400-80 MG tablet Commonly known as:  BACTRIM,SEPTRA Take 1 tablet by mouth every 12 (twelve) hours for 3 days.     tamsulosin 0.4 MG Caps capsule Commonly known as:  FLOMAX Take 1 capsule (0.4 mg total) by mouth daily.   traMADol 50 MG tablet Commonly known as:  ULTRAM Take 1 tablet (50 mg total) by mouth every 6 (six) hours as needed (mild pain). What changed:  reasons to take this   TUDORZA PRESSAIR 400 MCG/ACT Aepb Generic drug:  Aclidinium Bromide Inhale 1 puff into the lungs every other day.            Discharge Care Instructions  (From admission, onward)        Start     Ordered   09/22/17 0000  Weight bearing as tolerated    Question Answer Comment  Laterality left   Extremity Lower      09/22/17 0908   09/22/17 0000  Change dressing    Comments:  You may change your dressing dressing daily with sterile 4 x 4 inch gauze dressing and paper tape.  Do not submerge the incision under water.   09/22/17 0908     Allergies  Allergen Reactions  . Amoxicillin Anaphylaxis and Rash  . Ciprofloxacin Anaphylaxis and Other (See Comments)    Thrush  . Lorabid [Loracarbef] Anaphylaxis and Rash  . Avelox [Moxifloxacin] Other (See Comments)    Tendon and joint pain  . Erythromycin Other (See Comments)    angioedema  . Inderal [Propranolol] Other (See Comments)    Vasculitis  . Chantix [Varenicline] Nausea Only    Contact information for follow-up providers    Ollen Gross, MD. Schedule an appointment as soon as possible for a visit on 10/03/2017.   Specialty:  Orthopedic Surgery Contact information: [address] [place] [address] [phone]            Contact information for after-discharge care    Destination    [facility] Follow up.   Service:  Skilled Nursing Contact information: [address] [place] [address] [phone]                   The results of significant diagnostics from this hospitalization (including imaging, microbiology, ancillary and laboratory) are listed below for  reference.    Significant Diagnostic Studies: Dg Chest 1 View  Result Date: 09/19/2017 CLINICAL DATA:  Unwitnessed fall.  History of hypertension and COPD. EXAM: CHEST 1 VIEW COMPARISON:  Chest x-ray of August 17, 2017 FINDINGS: The lungs are well-expanded. There is no focal infiltrate. The heart is top-normal in size. There is dense calcification in the wall of the thoracic aorta. The pulmonary vascularity is not engorged. The observed portions of the bony thorax exhibit no acute abnormalities. A nerve stimulator electrode is present over the mid to lower thoracic spine. The patient has undergone previous lower anterior cervical fusion. IMPRESSION: There is no acute cardiopulmonary abnormality. Thoracic aortic atherosclerosis. Electronically Signed   By: David  [place] M.D.   On: 09/19/2017 10:54   Ct Head Wo Contrast  Result Date: 09/19/2017 CLINICAL  DATA:  Fall.  Currently on anticoagulation treatment. EXAM: CT HEAD WITHOUT CONTRAST CT CERVICAL SPINE WITHOUT CONTRAST TECHNIQUE: Multidetector CT imaging of the head and cervical spine was performed following the standard protocol without intravenous contrast. Multiplanar CT image reconstructions of the cervical spine were also generated. COMPARISON:  Head CT 08/17/2017 FINDINGS: CT HEAD FINDINGS Brain: No mass lesion, intraparenchymal hemorrhage or extra-axial collection. No evidence of acute cortical infarct. There is periventricular hypoattenuation compatible with chronic microvascular disease. Vascular: No hyperdense vessel or unexpected calcification. Skull: Normal visualized skull base, calvarium and extracranial soft tissues. Sinuses/Orbits: No sinus fluid levels or advanced mucosal thickening. No mastoid effusion. Normal orbits. CT CERVICAL SPINE FINDINGS Alignment: There is grade 1 anterolisthesis at C7-T1, likely degenerative. Skull base and vertebrae: No acute fracture. There is C4-C7 ACDF without hardware abnormality. Soft tissues and spinal  canal: No prevertebral fluid or swelling. No visible canal hematoma. Disc levels: No advanced spinal canal or neural foraminal stenosis. Bilateral C3-C4 facet fusion. Severe right C7-T1 foraminal stenosis. Upper chest: No pneumothorax, pulmonary nodule or pleural effusion. Other: Normal visualized paraspinal cervical soft tissues. IMPRESSION: 1. Chronic ischemic microangiopathy without acute intracranial abnormality. 2. No acute fracture of the cervical spine. 3. Status post C4-C7 ACDF with grade 1 C7-T1 anterolisthesis. Electronically Signed   By: Deatra Robinson M.D.   On: 09/19/2017 00:36   Ct Cervical Spine Wo Contrast  Result Date: 09/19/2017 CLINICAL DATA:  Fall.  Currently on anticoagulation treatment. EXAM: CT HEAD WITHOUT CONTRAST CT CERVICAL SPINE WITHOUT CONTRAST TECHNIQUE: Multidetector CT imaging of the head and cervical spine was performed following the standard protocol without intravenous contrast. Multiplanar CT image reconstructions of the cervical spine were also generated. COMPARISON:  Head CT 08/17/2017 FINDINGS: CT HEAD FINDINGS Brain: No mass lesion, intraparenchymal hemorrhage or extra-axial collection. No evidence of acute cortical infarct. There is periventricular hypoattenuation compatible with chronic microvascular disease. Vascular: No hyperdense vessel or unexpected calcification. Skull: Normal visualized skull base, calvarium and extracranial soft tissues. Sinuses/Orbits: No sinus fluid levels or advanced mucosal thickening. No mastoid effusion. Normal orbits. CT CERVICAL SPINE FINDINGS Alignment: There is grade 1 anterolisthesis at C7-T1, likely degenerative. Skull base and vertebrae: No acute fracture. There is C4-C7 ACDF without hardware abnormality. Soft tissues and spinal canal: No prevertebral fluid or swelling. No visible canal hematoma. Disc levels: No advanced spinal canal or neural foraminal stenosis. Bilateral C3-C4 facet fusion. Severe right C7-T1 foraminal stenosis.  Upper chest: No pneumothorax, pulmonary nodule or pleural effusion. Other: Normal visualized paraspinal cervical soft tissues. IMPRESSION: 1. Chronic ischemic microangiopathy without acute intracranial abnormality. 2. No acute fracture of the cervical spine. 3. Status post C4-C7 ACDF with grade 1 C7-T1 anterolisthesis. Electronically Signed   By: Deatra Robinson M.D.   On: 09/19/2017 00:36   Pelvis Portable  Result Date: 09/20/2017 CLINICAL DATA:  Postop hip replacement EXAM: PORTABLE PELVIS 1-2 VIEWS COMPARISON:  08/30/2017, radiograph 09/18/2016 FINDINGS: Interval left hip replacement with normal alignment. Gas in the soft tissues of the lateral hip. Surgical drain is present. IMPRESSION: Interval left hip replacement with expected postsurgical changes Electronically Signed   By: Jasmine Pang M.D.   On: 09/20/2017 20:57   Ct Hip Left Wo Contrast  Result Date: 08/30/2017 CLINICAL DATA:  Chronic left hip pain for the past 3 years. Possible subcapital left femoral neck fracture seen on x-ray. EXAM: CT OF THE LEFT HIP WITHOUT CONTRAST TECHNIQUE: Multidetector CT imaging of the left hip was performed according to the standard protocol. Multiplanar CT image  reconstructions were also generated. COMPARISON:  Bilateral hip x-rays from same day. CT abdomen pelvis dated January 31, 2005. FINDINGS: Bones/Joint/Cartilage No fracture or dislocation. Irregularity along the lateral subcapital femoral neck is degenerative. Mild posterior left hip joint space narrowing. Normal alignment. No joint effusion. Osteopenia. Ligaments Ligaments are suboptimally evaluated by CT. Muscles and Tendons Mild atrophy of the left gluteus minimus muscle. Soft tissue No fluid collection or hematoma. No soft tissue mass. The visualized intrapelvic contents are unremarkable. IMPRESSION: 1. No hip fracture. Irregularity along the lateral subcapital femoral neck is degenerative. Electronically Signed   By: Obie Dredge M.D.   On: 08/30/2017 15:43     Dg Hip Unilat W Or Wo Pelvis 2-3 Views Left  Result Date: 09/19/2017 CLINICAL DATA:  Initial evaluation for acute trauma, fall. EXAM: DG HIP (WITH OR WITHOUT PELVIS) 2-3V LEFT COMPARISON:  None. FINDINGS: There is an acute fracture subcapital fracture extending through the left femoral neck with slight superior subluxation. Femoral head remains normally aligned within the acetabulum. Limited views of the right hip demonstrate no acute abnormality. Bony pelvis intact. Advanced osteopenia. No acute soft tissue abnormality. Prominent aorto bi-iliac atherosclerotic disease noted. IMPRESSION: Acute mildly displaced subcapital fracture of the left femoral neck. Electronically Signed   By: Rise Mu M.D.   On: 09/19/2017 00:30   Dg Hips Bilat W Or Wo Pelvis 5 Views  Result Date: 08/30/2017 CLINICAL DATA:  History of chronic hip pain. Clinical report of left femur fracture. EXAM: DG HIP (WITH OR WITHOUT PELVIS) 5+V BILAT COMPARISON:  None. FINDINGS: No evidence of pelvic fracture. Right hip appears normal. At the lateral femoral head/ neck, there is a cortical irregularity which is indeterminate. This could be chronic or could represent a minimal femoral neck fracture. Consider MRI for confirmation. IMPRESSION: Indeterminate age cortical irregularity at the lateral subcapital femoral neck. This could be old, but an acute injury is not excluded on the basis of these films. Consider MRI for more accurate evaluation. Electronically Signed   By: Paulina Fusi M.D.   On: 08/30/2017 13:52    Microbiology: Recent Results (from the past 240 hour(s))  Surgical pcr screen     Status: Abnormal   Collection Time: 09/19/17  5:51 PM  Result Value Ref Range Status   MRSA, PCR NEGATIVE NEGATIVE Final   Staphylococcus aureus POSITIVE (A) NEGATIVE Final    Comment: (NOTE) The Xpert SA Assay (FDA approved for NASAL specimens in patients 65 years of age and older), is one component of a  comprehensive surveillance program. It is not intended to diagnose infection nor to guide or monitor treatment.   Culture, Urine     Status: None   Collection Time: 09/20/17 10:15 AM  Result Value Ref Range Status   Specimen Description URINE, RANDOM  Final   Special Requests NONE  Final   Culture   Final    NO GROWTH Performed at [facility], [address] [place], [place] [address]    Report Status 09/21/2017 FINAL  Final     Labs: Basic Metabolic Panel: Recent Labs  Lab 09/19/17 0113 09/19/17 0535 09/20/17 0428 09/21/17 0421 09/22/17 0417  NA 137 140 138 136 136  K 3.6 3.7 3.8 4.3 4.2  CL 106 106 105 102 102  CO2 25 28 28 27 27   GLUCOSE 137* 107* 94 146* 92  BUN 14 11 9 11 16   CREATININE 0.55 0.62 0.63 0.67 0.80  CALCIUM 8.5* 8.5* 8.1* 8.2* 8.2*  MG  --   --   --   --  1.8   Liver Function Tests: No results for input(s): AST, ALT, ALKPHOS, BILITOT, PROT, ALBUMIN in the last 168 hours. No results for input(s): LIPASE, AMYLASE in the last 168 hours. No results for input(s): AMMONIA in the last 168 hours. CBC: Recent Labs  Lab 09/19/17 0113 09/19/17 0535 09/20/17 0428 09/21/17 0421 09/22/17 0417  WBC 7.1 5.3 5.7 9.2 7.7  NEUTROABS 5.7  --   --   --  6.2  HGB 10.5* 10.4* 10.0* 9.7* 8.2*  HCT 33.5* 33.5* 31.8* 31.0* 26.5*  MCV 92.0 93.1 92.7 92.5 93.0  PLT 366 346 334 333 316   Cardiac Enzymes: No results for input(s): CKTOTAL, CKMB, CKMBINDEX, TROPONINI in the last 168 hours. BNP: BNP (last 3 results) No results for input(s): BNP in the last 8760 hours.  ProBNP (last 3 results) No results for input(s): PROBNP in the last 8760 hours.  CBG: No results for input(s): GLUCAP in the last 168 hours.     Signed:  Albertine GratesFang Desjuan Stearns MD, PhD  [facility] 09/23/2017, 1:57 PM

## 2017-09-23 NOTE — Progress Notes (Signed)
Subjective: 3 Days Post-Op Procedure(s) (LRB): ARTHROPLASTY BIPOLAR HIP (HEMIARTHROPLASTY) LEFT (Left) Patient reports pain as 2 on 0-10 scale.    Objective: Vital signs in last 24 hours: Temp:  [98.9 F (37.2 C)-99.6 F (37.6 C)] 99.2 F (37.3 C) (01/26 0409) Pulse Rate:  [90-102] 90 (01/26 0409) Resp:  [16-18] 18 (01/26 0409) BP: (104-114)/(49-64) 104/49 (01/26 0409) SpO2:  [94 %-96 %] 94 % (01/26 0409) Weight:  [41.2 kg (90 lb 13.3 oz)] 41.2 kg (90 lb 13.3 oz) (01/26 0409)  Intake/Output from previous day: 01/25 0701 - 01/26 0700 In: 120 [P.O.:120] Out: 200 [Urine:200] Intake/Output this shift: No intake/output data recorded.  Recent Labs    09/21/17 0421 09/22/17 0417  HGB 9.7* 8.2*   Recent Labs    09/21/17 0421 09/22/17 0417  WBC 9.2 7.7  RBC 3.35* 2.85*  HCT 31.0* 26.5*  PLT 333 316   Recent Labs    09/21/17 0421 09/22/17 0417  NA 136 136  K 4.3 4.2  CL 102 102  CO2 27 27  BUN 11 16  CREATININE 0.67 0.80  GLUCOSE 146* 92  CALCIUM 8.2* 8.2*   No results for input(s): LABPT, INR in the last 72 hours.  Neurologically intact Sensation intact distally Dorsiflexion/Plantar flexion intact Incision: dressing C/D/I  Assessment/Plan: 3 Days Post-Op Procedure(s) (LRB): ARTHROPLASTY BIPOLAR HIP (HEMIARTHROPLASTY) LEFT (Left) Advance diet Up with therapy D/C IV fluids  Yared Barefoot C 09/23/2017, 8:40 AM

## 2017-09-23 NOTE — Progress Notes (Signed)
Clinical Social Worker facilitated patient discharge including contacting patient family and facility to confirm patient discharge plans.  Clinical information faxed to facility and family agreeable with plan.  CSW arranged ambulance transport via [facility] to [facility] .  RN to call [phone] (pt will go into room 220) for report prior to discharge.  Clinical Social Worker will sign off for now as social work intervention is no longer needed. Please consult us again if new need arises.  Marrianne MoodAshley Gabryel Talamo, MSW, [employer]CSWA [phone]

## 2017-09-23 NOTE — Clinical Social Work Placement (Signed)
   CLINICAL SOCIAL WORK PLACEMENT  NOTE  Date:  09/23/2017  Patient Details  Name: [name] MRN: [id number] Date of Birth: 21-Feb-1929  Clinical Social Work is seeking post-discharge placement for this patient at the   level of care (*CSW will initial, date and re-position this form in  chart as items are completed):  Yes   Patient/family provided with [facility] [facility] Clinical Social Work Department's list of facilities offering this level of care within the geographic area requested by the patient (or if unable, by the patient's family).  Yes   Patient/family informed of their freedom to choose among providers that offer the needed level of care, that participate in Medicare, Medicaid or managed care program needed by the patient, have an available bed and are willing to accept the patient.  Yes   Patient/family informed of [facility]'s ownership interest in [facility] and [facility], as well as of the fact that they are under no obligation to receive care at these facilities.  PASRR submitted to EDS on       PASRR number received on       Existing PASRR number confirmed on 09/23/17     FL2 transmitted to all facilities in geographic area requested by pt/family on 09/23/17     FL2 transmitted to all facilities within larger geographic area on       Patient informed that his/her managed care company has contracts with or will negotiate with certain facilities, including the following:            Patient/family informed of bed offers received.  [facility]     Physician recommends and patient chooses bed at      [facility].  Patient to be transferred to facility by [facility]     Patient family notified on 09/23/17 of transfer.  Name of family member notified:  pt daughter in the room and is aware of transport      PHYSICIAN       Additional Comment:     _______________________________________________ Althea CharonAshley C Hong Moring, LCSW 09/23/2017, 12:32 PM

## 2017-09-25 ENCOUNTER — Non-Acute Institutional Stay (SKILLED_NURSING_FACILITY): Payer: Medicare Other | Admitting: Adult Health

## 2017-09-25 ENCOUNTER — Encounter: Payer: Self-pay | Admitting: Adult Health

## 2017-09-25 DIAGNOSIS — F29 Unspecified psychosis not due to a substance or known physiological condition: Secondary | ICD-10-CM | POA: Diagnosis not present

## 2017-09-25 DIAGNOSIS — J449 Chronic obstructive pulmonary disease, unspecified: Secondary | ICD-10-CM | POA: Diagnosis not present

## 2017-09-25 DIAGNOSIS — D62 Acute posthemorrhagic anemia: Secondary | ICD-10-CM

## 2017-09-25 DIAGNOSIS — G2581 Restless legs syndrome: Secondary | ICD-10-CM | POA: Insufficient documentation

## 2017-09-25 DIAGNOSIS — R5381 Other malaise: Secondary | ICD-10-CM

## 2017-09-25 DIAGNOSIS — S72002S Fracture of unspecified part of neck of left femur, sequela: Secondary | ICD-10-CM

## 2017-09-25 DIAGNOSIS — F419 Anxiety disorder, unspecified: Secondary | ICD-10-CM

## 2017-09-25 DIAGNOSIS — I25118 Atherosclerotic heart disease of native coronary artery with other forms of angina pectoris: Secondary | ICD-10-CM

## 2017-09-25 DIAGNOSIS — F0391 Unspecified dementia with behavioral disturbance: Secondary | ICD-10-CM | POA: Diagnosis not present

## 2017-09-25 DIAGNOSIS — N390 Urinary tract infection, site not specified: Secondary | ICD-10-CM

## 2017-09-25 DIAGNOSIS — F339 Major depressive disorder, recurrent, unspecified: Secondary | ICD-10-CM

## 2017-09-25 DIAGNOSIS — R339 Retention of urine, unspecified: Secondary | ICD-10-CM | POA: Diagnosis not present

## 2017-09-25 NOTE — Progress Notes (Signed)
Location:  [facility] Room Number: 220-A Place of Service:  SNF (31) Provider:  Kenard Gower, NP  Patient Care Team: [name], MD as PCP - General (Internal Medicine)  Extended Emergency Contact Information Primary Emergency Contact: Alden Benjamin of [place] Home Phone: [phone] Mobile Phone: [phone] Relation: Daughter  Code Status:  DNR  Goals of care: Advanced Directive information Advanced Directives 09/25/2017  Does Patient Have a Medical Advance Directive? Yes  Type of Advance Directive Out of facility DNR (pink MOST or yellow form)  Does patient want to make changes to medical advance directive? No - Patient declined  Copy of Healthcare Power of Attorney in Chart? -  Pre-existing out of facility DNR order (yellow form or pink MOST form) -     Chief Complaint  Patient presents with  . Acute Visit    Patient seen for hospital followup    HPI:  Pt is an 84 y.o. female seen today for hospital followup, status post an admission at [facility] 1/21-1/26/19 for closed left hip fracture. She was admitted to [facility] 09/23/17 for short-term rehabilitation.  She has a PMH of chronic systolic and diastolic heart failure, COPD, HTN, RLS, right lower lobe pneumonia, and WPW syndrome. She had an unwitnessed fall at a nursing facility. She complained of left hip pain. X-ray revealed left hip fracture. Orthopedic surgeon was consulted. Left hip bipolar hemiarthroplasty was done on 09/20/16.  Urinalysis showed possible UTI. She was on Doxycycline and was changed to Bactrim. She was started on Flomax for urinary retention. She was seen in her room while eating ice cream.    Past Medical History:  Diagnosis Date  . Abnormal cardiovascular stress test 01/17/2013   Decreased LV function with mild distal anterior perfusion abnormality   . Acute combined systolic and diastolic heart failure (HCC) 01/17/2013  .  Chronic combined systolic and diastolic CHF (congestive heart failure) (HCC) 08/17/2017  . COPD (chronic obstructive pulmonary disease) (HCC)   . Hypertension   . Restless leg syndrome   . Right lower lobe pneumonia (HCC) 08/17/2017  . Sepsis (HCC) 08/17/2017  . Tobacco use disorder   . WPW (Wolff-Parkinson-White syndrome)    Past Surgical History:  Procedure Laterality Date  . APPENDECTOMY    . BACK SURGERY     MULTIPLE  . HEMANGIOMA EXCISION    . HIP ARTHROPLASTY Left 09/20/2017   Procedure: ARTHROPLASTY BIPOLAR HIP (HEMIARTHROPLASTY) LEFT;  Surgeon: Ollen Gross, MD;  Location: [facility] ORS;  Service: Orthopedics;  Laterality: Left;  . KIDNEY SURGERY     RT KIDNEY TACK  . NECK SURGERY    . TONSILLECTOMY      Allergies  Allergen Reactions  . Amoxicillin Anaphylaxis and Rash  . Ciprofloxacin Anaphylaxis and Other (See Comments)    Thrush  . Lorabid [Loracarbef] Anaphylaxis and Rash  . Avelox [Moxifloxacin] Other (See Comments)    Tendon and joint pain  . Erythromycin Other (See Comments)    angioedema  . Inderal [Propranolol] Other (See Comments)    Vasculitis  . Chantix [Varenicline] Nausea Only    Outpatient Encounter Medications as of 09/25/2017  Medication Sig  . acetaminophen (TYLENOL) 500 MG tablet Take 500 mg by mouth every 6 (six) hours as needed for moderate pain.  . Aclidinium Bromide (TUDORZA PRESSAIR) 400 MCG/ACT AEPB Inhale 1 puff into the lungs every other day.  . albuterol (PROVENTIL) (2.5 MG/3ML) 0.083% nebulizer solution Take 2.5 mg by nebulization 3 (three) times daily as  needed for wheezing or shortness of breath.   Melene Muller ON 10/02/2017] aspirin 81 MG chewable tablet Chew 81 mg by mouth daily. Give after Lovenox course is completed  . B Complex Vitamins (B COMPLEX-B12) TABS Take 1 tablet by mouth daily.  . bisacodyl (DULCOLAX) 10 MG suppository Place 1 suppository (10 mg total) rectally daily for 3 days.  Marland Kitchen enoxaparin (LOVENOX) 30 MG/0.3ML injection Inject  0.3 mLs (30 mg total) into the skin daily. Take Lovenox injections daily for ten days, then discontinue and resume the home baby 81 mg Aspirin daily.  . fentaNYL (DURAGESIC - DOSED MCG/HR) 25 MCG/HR patch Place 1 patch (25 mcg total) onto the skin every 3 (three) days.  . fluticasone furoate-vilanterol (BREO ELLIPTA) 200-25 MCG/INH AEPB Inhale 1 puff into the lungs at bedtime.  . haloperidol (HALDOL) 0.5 MG tablet Take 1 tablet (0.5 mg total) by mouth every 6 (six) hours as needed for agitation.  Marland Kitchen HYDROcodone-acetaminophen (NORCO/VICODIN) 5-325 MG tablet Take 1 tablet by mouth every 6 (six) hours as needed for moderate pain.  Marland Kitchen LORazepam (ATIVAN) 0.5 MG tablet Take 1 tablet (0.5 mg total) by mouth 2 (two) times daily as needed for anxiety.  . Magnesium 250 MG TABS Take 1 tablet by mouth daily.  . methocarbamol (ROBAXIN) 500 MG tablet Take 0.5 tablets (250 mg total) by mouth at bedtime as needed.  . nitroGLYCERIN (NITROSTAT) 0.4 MG SL tablet Place 1 tablet (0.4 mg total) under the tongue every 5 (five) minutes x 3 doses as needed for chest pain.  . OXYGEN Inhale into the lungs. CONTINUE PRN OXYGEN AT 1 LITERS/NASAL CANNULA FOR OXYGEN SATURATION 87% OR BELOW  . pantoprazole (PROTONIX) 40 MG tablet Take 40 mg by mouth 2 (two) times daily.  . polyethylene glycol (MIRALAX / GLYCOLAX) packet Take 17 g by mouth daily.  . pramipexole (MIRAPEX) 1 MG tablet Take 0.5-1 mg by mouth 2 (two) times daily. 0.5 mg during the day prn and 1 mg at bedtime  . QUEtiapine (SEROQUEL) 100 MG tablet Take 1 tablet (100 mg total) by mouth at bedtime. Only gives 100 mg four times a week  . senna-docusate (SENOKOT-S) 8.6-50 MG tablet Take 1 tablet by mouth at bedtime.  . sertraline (ZOLOFT) 25 MG tablet Take 1 tablet (25 mg total) by mouth at bedtime.  . sulfamethoxazole-trimethoprim (BACTRIM,SEPTRA) 400-80 MG tablet Take 1 tablet by mouth every 12 (twelve) hours for 3 days.  . tamsulosin (FLOMAX) 0.4 MG CAPS capsule Take 1  capsule (0.4 mg total) by mouth daily.  . traMADol (ULTRAM) 50 MG tablet Take 50 mg by mouth every 6 (six) hours as needed.  . vitamin B-12 100 MCG tablet Take 1 tablet (100 mcg total) by mouth daily.  . [DISCONTINUED] traMADol (ULTRAM) 50 MG tablet Take 1 tablet (50 mg total) by mouth every 6 (six) hours as needed (mild pain).   No facility-administered encounter medications on file as of 09/25/2017.     Review of Systems  Unable to obtain due to dementia    Immunization History  Administered Date(s) Administered  . Influenza-Unspecified 05/29/2017  . Pneumococcal-Unspecified 05/29/2014   Pertinent  Health Maintenance Due  Topic Date Due  . PNA vac Low Risk Adult (1 of 2 - PCV13) 11/24/1995  . INFLUENZA VACCINE  03/29/2017  . DEXA SCAN  Completed      Vitals:   09/25/17 0906  BP: 106/64  Pulse: 84  Resp: 20  Temp: (!) 96.5 F (35.8 C)  TempSrc: Oral  SpO2: 95%  Weight: 90 lb 13.3 oz (41.2 kg)  Height: 5\' 1"  (1.549 m)   Body mass index is 17.16 kg/m.  Physical Exam  GENERAL APPEARANCE:  In no acute distress.  SKIN:  Left hip surgical incision has steristrips MOUTH and THROAT: Lips are without lesions. Oral mucosa is moist and without lesions. Tongue is normal in shape, size, and color and without lesions RESPIRATORY: Breathing is even & unlabored, BS CTAB CARDIAC: RRR, no murmur,no extra heart sounds, no edema GI: Abdomen soft, normal BS, no masses, no tenderness EXTREMITIES:  Able to move X 4 extremities PSYCHIATRIC: Alert to self, disoriented to time and place. Affect and behavior are appropriate   Labs reviewed: Recent Labs    09/20/17 0428 09/21/17 0421 09/22/17 0417  NA 138 136 136  K 3.8 4.3 4.2  CL 105 102 102  CO2 28 27 27   GLUCOSE 94 146* 92  BUN 9 11 16   CREATININE 0.63 0.67 0.80  CALCIUM 8.1* 8.2* 8.2*  MG  --   --  1.8   Recent Labs    08/17/17 0915 08/18/17 0633  AST 32 25  ALT 19 19  ALKPHOS 58 47  BILITOT 0.9 0.6  PROT 7.0 5.7*   ALBUMIN 3.5 2.7*   Recent Labs    09/19/17 0113  09/20/17 0428 09/21/17 0421 09/22/17 0417  WBC 7.1   < > 5.7 9.2 7.7  NEUTROABS 5.7  --   --   --  6.2  HGB 10.5*   < > 10.0* 9.7* 8.2*  HCT 33.5*   < > 31.8* 31.0* 26.5*  MCV 92.0   < > 92.7 92.5 93.0  PLT 366   < > 334 333 316   < > = values in this interval not displayed.    Lab Results  Component Value Date   CHOL 201 (H) 12/22/2012   HDL 69 12/22/2012   LDLCALC 110 (H) 12/22/2012   TRIG 108 12/22/2012   CHOLHDL 2.9 12/22/2012    Significant Diagnostic Results in last 30 days:  Dg Chest 1 View  Result Date: 09/19/2017 CLINICAL DATA:  Unwitnessed fall.  History of hypertension and COPD. EXAM: CHEST 1 VIEW COMPARISON:  Chest x-ray of August 17, 2017 FINDINGS: The lungs are well-expanded. There is no focal infiltrate. The heart is top-normal in size. There is dense calcification in the wall of the thoracic aorta. The pulmonary vascularity is not engorged. The observed portions of the bony thorax exhibit no acute abnormalities. A nerve stimulator electrode is present over the mid to lower thoracic spine. The patient has undergone previous lower anterior cervical fusion. IMPRESSION: There is no acute cardiopulmonary abnormality. Thoracic aortic atherosclerosis. Electronically Signed   By: David  Swazil[place]Jordan M.D.   On: 09/19/2017 10:54   Ct Head Wo Contrast  Result Date: 09/19/2017 CLINICAL DATA:  Fall.  Currently on anticoagulation treatment. EXAM: CT HEAD WITHOUT CONTRAST CT CERVICAL SPINE WITHOUT CONTRAST TECHNIQUE: Multidetector CT imaging of the head and cervical spine was performed following the standard protocol without intravenous contrast. Multiplanar CT image reconstructions of the cervical spine were also generated. COMPARISON:  Head CT 08/17/2017 FINDINGS: CT HEAD FINDINGS Brain: No mass lesion, intraparenchymal hemorrhage or extra-axial collection. No evidence of acute cortical infarct. There is periventricular  hypoattenuation compatible with chronic microvascular disease. Vascular: No hyperdense vessel or unexpected calcification. Skull: Normal visualized skull base, calvarium and extracranial soft tissues. Sinuses/Orbits: No sinus fluid levels or advanced mucosal thickening. No mastoid effusion. Normal orbits. CT  CERVICAL SPINE FINDINGS Alignment: There is grade 1 anterolisthesis at C7-T1, likely degenerative. Skull base and vertebrae: No acute fracture. There is C4-C7 ACDF without hardware abnormality. Soft tissues and spinal canal: No prevertebral fluid or swelling. No visible canal hematoma. Disc levels: No advanced spinal canal or neural foraminal stenosis. Bilateral C3-C4 facet fusion. Severe right C7-T1 foraminal stenosis. Upper chest: No pneumothorax, pulmonary nodule or pleural effusion. Other: Normal visualized paraspinal cervical soft tissues. IMPRESSION: 1. Chronic ischemic microangiopathy without acute intracranial abnormality. 2. No acute fracture of the cervical spine. 3. Status post C4-C7 ACDF with grade 1 C7-T1 anterolisthesis. Electronically Signed   By: Deatra Robinson M.D.   On: 09/19/2017 00:36   Ct Cervical Spine Wo Contrast  Result Date: 09/19/2017 CLINICAL DATA:  Fall.  Currently on anticoagulation treatment. EXAM: CT HEAD WITHOUT CONTRAST CT CERVICAL SPINE WITHOUT CONTRAST TECHNIQUE: Multidetector CT imaging of the head and cervical spine was performed following the standard protocol without intravenous contrast. Multiplanar CT image reconstructions of the cervical spine were also generated. COMPARISON:  Head CT 08/17/2017 FINDINGS: CT HEAD FINDINGS Brain: No mass lesion, intraparenchymal hemorrhage or extra-axial collection. No evidence of acute cortical infarct. There is periventricular hypoattenuation compatible with chronic microvascular disease. Vascular: No hyperdense vessel or unexpected calcification. Skull: Normal visualized skull base, calvarium and extracranial soft tissues.  Sinuses/Orbits: No sinus fluid levels or advanced mucosal thickening. No mastoid effusion. Normal orbits. CT CERVICAL SPINE FINDINGS Alignment: There is grade 1 anterolisthesis at C7-T1, likely degenerative. Skull base and vertebrae: No acute fracture. There is C4-C7 ACDF without hardware abnormality. Soft tissues and spinal canal: No prevertebral fluid or swelling. No visible canal hematoma. Disc levels: No advanced spinal canal or neural foraminal stenosis. Bilateral C3-C4 facet fusion. Severe right C7-T1 foraminal stenosis. Upper chest: No pneumothorax, pulmonary nodule or pleural effusion. Other: Normal visualized paraspinal cervical soft tissues. IMPRESSION: 1. Chronic ischemic microangiopathy without acute intracranial abnormality. 2. No acute fracture of the cervical spine. 3. Status post C4-C7 ACDF with grade 1 C7-T1 anterolisthesis. Electronically Signed   By: Deatra Robinson M.D.   On: 09/19/2017 00:36   Pelvis Portable  Result Date: 09/20/2017 CLINICAL DATA:  Postop hip replacement EXAM: PORTABLE PELVIS 1-2 VIEWS COMPARISON:  08/30/2017, radiograph 09/18/2016 FINDINGS: Interval left hip replacement with normal alignment. Gas in the soft tissues of the lateral hip. Surgical drain is present. IMPRESSION: Interval left hip replacement with expected postsurgical changes Electronically Signed   By: Jasmine Pang M.D.   On: 09/20/2017 20:57   Ct Hip Left Wo Contrast  Result Date: 08/30/2017 CLINICAL DATA:  Chronic left hip pain for the past 3 years. Possible subcapital left femoral neck fracture seen on x-ray. EXAM: CT OF THE LEFT HIP WITHOUT CONTRAST TECHNIQUE: Multidetector CT imaging of the left hip was performed according to the standard protocol. Multiplanar CT image reconstructions were also generated. COMPARISON:  Bilateral hip x-rays from same day. CT abdomen pelvis dated January 31, 2005. FINDINGS: Bones/Joint/Cartilage No fracture or dislocation. Irregularity along the lateral subcapital femoral neck  is degenerative. Mild posterior left hip joint space narrowing. Normal alignment. No joint effusion. Osteopenia. Ligaments Ligaments are suboptimally evaluated by CT. Muscles and Tendons Mild atrophy of the left gluteus minimus muscle. Soft tissue No fluid collection or hematoma. No soft tissue mass. The visualized intrapelvic contents are unremarkable. IMPRESSION: 1. No hip fracture. Irregularity along the lateral subcapital femoral neck is degenerative. Electronically Signed   By: Obie Dredge M.D.   On: 08/30/2017 15:43   Dg  Hip Unilat W Or Wo Pelvis 2-3 Views Left  Result Date: 09/19/2017 CLINICAL DATA:  Initial evaluation for acute trauma, fall. EXAM: DG HIP (WITH OR WITHOUT PELVIS) 2-3V LEFT COMPARISON:  None. FINDINGS: There is an acute fracture subcapital fracture extending through the left femoral neck with slight superior subluxation. Femoral head remains normally aligned within the acetabulum. Limited views of the right hip demonstrate no acute abnormality. Bony pelvis intact. Advanced osteopenia. No acute soft tissue abnormality. Prominent aorto bi-iliac atherosclerotic disease noted. IMPRESSION: Acute mildly displaced subcapital fracture of the left femoral neck. Electronically Signed   By: Rise Mu M.D.   On: 09/19/2017 00:30   Dg Hips Bilat W Or Wo Pelvis 5 Views  Result Date: 08/30/2017 CLINICAL DATA:  History of chronic hip pain. Clinical report of left femur fracture. EXAM: DG HIP (WITH OR WITHOUT PELVIS) 5+V BILAT COMPARISON:  None. FINDINGS: No evidence of pelvic fracture. Right hip appears normal. At the lateral femoral head/ neck, there is a cortical irregularity which is indeterminate. This could be chronic or could represent a minimal femoral neck fracture. Consider MRI for confirmation. IMPRESSION: Indeterminate age cortical irregularity at the lateral subcapital femoral neck. This could be old, but an acute injury is not excluded on the basis of these films. Consider  MRI for more accurate evaluation. Electronically Signed   By: Paulina Fusi M.D.   On: 08/30/2017 13:52    Assessment/Plan  1. Physical deconditioning - for rehabilitation, PT and OT, for therapeutic strengthening exercises, fall precautions   2. Closed fracture of left hip, sequela - S/P Left hip bipolar hemiarthroplasty on 09/20/17, for rehabilitation, PT and OT for therapeutic strengthening exercises, decreased Norco 5-325 mg from 1 tab every 6 hours when necessary 21 tab every 12 hours when necessary, continue tramadol 50 mg 1 tab every 6 hours when necessary, fentanyl 25 g/HR 1 patch to skin every 3 days, and acetaminophen 500 mg 1 tab every 6 hours when necessary for pain, continue Robaxin 500 mg give 1/2 tab = 250 mg daily at bedtime when necessary for muscle spasm, Lovenox 30 mg subcutaneous daily (stop date on 10/01/17) for DVT prophylaxis, follow-up with orthopedic surgeon, Dr. Lequita Halt, on 10/03/17   3. Chronic obstructive pulmonary disease, unspecified COPD type (HCC) - no SOB, continue albuterol nebulization when necessary, Turdoza pressair  400 mcg 1 puff into the lungs daily and Breo Ellipta  1 puff daily   4. Urinary tract infection without hematuria, site unspecified - continue Bactrim, assist with perineal care   5. Urinary retention - recently started on Flomax   6. Coronary artery disease of native artery of native heart with stable angina pectoris (HCC) - no complaints of chest pains, continue NTG PRN and ASA 81 mg daily   7. Psychosis, unspecified psychosis type (HCC) - will discontinue Haldol PRN, continue Seroquel 100 mg 1 tab daily, psych consult  8. Dementia with behavioral disturbance, unspecified dementia type - continue supportive care, fall precautions   9. Depression, recurrent (HCC) - continue Zoloft 25 mg 1 tab daily at bedtime   10. Anxiety  - mood this is stable, continue Ativan 0.5 mg 1 tab twice a day  11. Restless leg syndrome - continue Mirapex 0.5  mg 1 tab daily and at bedtime when necessary  12. Anemia due to acute blood loss - start FeSO4 325 mg 1 tab BID, CBC in 1 week Lab Results  Component Value Date   HGB 8.2 (L) 09/22/2017  Family/ staff Communication: Discussed plan of care with patient and charge nurse.  Labs/tests ordered:  CBC in 1 week  Goals of care:   Short-term care   Kenard Gower, NP [facility] [phone] (Monday-Friday 8:00 a.m. - 5:00 p.m.) [phone] (after hours)

## 2017-09-26 ENCOUNTER — Encounter: Payer: Self-pay | Admitting: Internal Medicine

## 2017-09-26 ENCOUNTER — Non-Acute Institutional Stay (SKILLED_NURSING_FACILITY): Payer: Medicare Other | Admitting: Internal Medicine

## 2017-09-26 DIAGNOSIS — D649 Anemia, unspecified: Secondary | ICD-10-CM | POA: Insufficient documentation

## 2017-09-26 DIAGNOSIS — E43 Unspecified severe protein-calorie malnutrition: Secondary | ICD-10-CM

## 2017-09-26 DIAGNOSIS — Z9189 Other specified personal risk factors, not elsewhere classified: Secondary | ICD-10-CM | POA: Diagnosis not present

## 2017-09-26 DIAGNOSIS — S72002A Fracture of unspecified part of neck of left femur, initial encounter for closed fracture: Secondary | ICD-10-CM | POA: Diagnosis not present

## 2017-09-26 NOTE — Patient Instructions (Signed)
See assessment and plan under each diagnosis in the problem list and acutely for this visit 

## 2017-09-26 NOTE — Assessment & Plan Note (Signed)
PT/OT at SNF  Orthopedic follow-up as scheduled  

## 2017-09-26 NOTE — Assessment & Plan Note (Signed)
Monitor for any bleeding while on the Lovenox prophylaxis

## 2017-09-26 NOTE — Progress Notes (Signed)
NURSING HOME LOCATION:  [facility] ROOM NUMBER:  [id number]  CODE STATUS:  DNR  PCP:  Marden NobleGates, Robert, MD  [address] [place] [id number]    This is a comprehensive admission note to [facility] performed on this date less than 30 days from date of admission. Included are preadmission medical/surgical history;reconciled medication list; family history; social history and comprehensive review of systems.  Corrections and additions to the records were documented . Comprehensive physical exam was also performed. Additionally a clinical summary was entered for each active diagnosis pertinent to this admission in the Problem List to enhance continuity of care.  HPI:the patient was hospitalized 1/21-1/26/19 for a closed left hip fracture sustained in an unwitnessed fall at another  nursing facility. Dr Despina HickAlusio performed a left hip bipolar hemiarthroplasty on 09/20/17. Systolic blood pressure was in the 80s initially, losartan was held. Urinalysis suggested UTI, culture was not collected. Antibiotic coverage initially with doxycycline was changed to Bactrim. The patient did have episodes of agitation in the setting of advanced dementia At bedtime Seroquel  and Ativan and Haldol as needed were continued. Fentanyl patch for chronic pain was continued. Nutrition was consulted for severe malnutrition with a BMI of 18.7 kg/m Hospital labs revealed calcium 8.2, hemoglobin 8.2, and hematocrit 26.5 with normochromic, normocytic indices.  Past medical and surgical history: includes Evelene CroonWolff Parkinson White syndrome, chronic combined systolic and diastolic congestive heart failure,COPD/oxygen dependent,and restless leg syndrome.Prior surgeries included tacking of the right kidney and multiple back surgeries. She has had "neck surgery" without further description.  Social history:hart describes her as a current every day smoker prior to the fall.  Family history:eviewed, but  noncontributory due to age  Review of systems:Could not be completed due to dementia. The patient was unable to give the date. She did state that she had some nausea and no appetite. She also stated that her right leg was aching. She was unaware that she had had any surgery. When asked what the surgeon had done, she stated that he gave her" a shot in the leg".  Constitutional: No fever,significant weight change, fatigue  Cardiovascular: No chest pain, palpitations,paroxysmal nocturnal dyspnea, claudication, edema  Respiratory: No cough, sputum production,hemoptysis, DOE , significant snoring,apnea  Gastrointestinal: No heartburn,dysphagia,abdominal pain,  vomiting,rectal bleeding, melena,change in bowels Genitourinary: No dysuria,hematuria, pyuria,  incontinence, nocturia Musculoskeletal: No joint stiffness, joint swelling, weakness Dermatologic: No rash, pruritus, change in appearance of skin Neurologic: No dizziness,headache,syncope, seizures, numbness , tingling Psychiatric: No significant anxiety , depression, insomnia Endocrine: No change in hair/skin/ nails, excessive thirst, excessive hunger, excessive urination  Hematologic/lymphatic: No significant bruising, lymphadenopathy,abnormal bleeding Allergy/immunology: No itchy/ watery eyes, significant sneezing, urticaria, angioedema  Physical exam:  Pertinent or positive findings:she is somnolent. Her history is rambling and nonsensical. She appears suboptimally nourished and chronically ill. Her facies are weathered. There is a very faint suggestion of anisocoria,the right pupil minimally larger than the left . An upper plate and lower partial present. Heart rhythm is irregular with flow murmur suggested.Breath sounds are decreased. There is limb atrophy and interosseous wasting. She has intermittent involuntary jerking of the extremities.There is trace edema at the sock line. Pedal pulses are decreased. The patient was unable to follow  commands coherently.  General appearance: no acute distress , increased work of breathing is present.   Lymphatic: No lymphadenopathy about the head, neck, axilla . Eyes: No conjunctival inflammation or lid edema is present. There is no scleral icterus. Ears:  External  ear exam shows no significant lesions or deformities.   Nose:  External nasal examination shows no deformity or inflammation. Nasal mucosa are pink and moist without lesions ,exudates Oral exam: lips and gums are healthy appearing.There is no oropharyngeal erythema or exudate . Neck:  No thyromegaly, masses, tenderness noted.    Heart:  No gallop, click, rub .  Lungs: without wheezes, rhonchi,rales , rubs. Abdomen:Bowel sounds are normal. Abdomen is soft and nontender with no organomegaly, hernias,masses. GU: deferred  Extremities:  No cyanosis, clubbing Neurologic exam : Balance,Rhomberg,finger to nose testing could not be completed due to clinical state Skin: Warm & dry w/o tenting. No significant lesions or rash.  See clinical summary under each active problem in the Problem List with associated updated therapeutic plan '

## 2017-09-26 NOTE — Assessment & Plan Note (Signed)
Nutrition consult

## 2017-09-26 NOTE — Assessment & Plan Note (Signed)
09/26/17 polypharmacy weaning has been initiated.

## 2017-10-12 ENCOUNTER — Encounter: Payer: Self-pay | Admitting: Internal Medicine

## 2017-10-12 ENCOUNTER — Non-Acute Institutional Stay (SKILLED_NURSING_FACILITY): Payer: Medicare Other | Admitting: Internal Medicine

## 2017-10-12 DIAGNOSIS — F0390 Unspecified dementia without behavioral disturbance: Secondary | ICD-10-CM

## 2017-10-12 DIAGNOSIS — J418 Mixed simple and mucopurulent chronic bronchitis: Secondary | ICD-10-CM | POA: Diagnosis not present

## 2017-10-12 DIAGNOSIS — I5042 Chronic combined systolic (congestive) and diastolic (congestive) heart failure: Secondary | ICD-10-CM | POA: Diagnosis not present

## 2017-10-12 LAB — BASIC METABOLIC PANEL
BUN: 11 (ref 4–21)
CREATININE: 0.6 (ref 0.5–1.1)
GLUCOSE: 149
Potassium: 4 (ref 3.4–5.3)
SODIUM: 141 (ref 137–147)

## 2017-10-12 LAB — CBC AND DIFFERENTIAL
HCT: 33 — AB (ref 36–46)
Hemoglobin: 10.1 — AB (ref 12.0–16.0)
Neutrophils Absolute: 4
PLATELETS: 409 — AB (ref 150–399)
WBC: 6.1

## 2017-10-12 NOTE — Patient Instructions (Signed)
See assessment and plan under each diagnosis in the problem list and acutely for this visit 

## 2017-10-12 NOTE — Progress Notes (Signed)
Room: 220 This is a nursing facility follow up for specific acute issue of  respiratory tract symptoms with concern for HCAP.  Interim medical record and care since last [facility] visit was updated with review of diagnostic studies and change in clinical status since last visit were documented.  HPI:  The patient was admitted to the facility 09/23/17 following left hip hemiarthroplasty on 1/23. She had sustained a fracture in an unwitnessed fall at another facility. Staff reports that she has developed a rattly cough. Abnormal breath sounds have been noted suggesting possible HCAP.  Empirically doxycycline has been started. The patient has history of multiple allergies including anaphylaxis with amoxicillin, ciprofloxacin, Lorabid,. She has had angioedema with erythromycin. Avelox caused tendon and joint pain. She has a history of right lower lobe pneumonia with pneumococcus. She also has COPD in the context of tobacco use disorder. The most recent chest x-ray 09/19/17 revealed hyperinflation but no active pulmonary disease. She has diagnoses of protein-caloric malnutrition, severe. There is a history of @ least 15 years of smoking. The mobile x-ray was personally reviewed. This shows increased interstitial markings with some patchy ill-defined air tissue densities in the right lower lobe. There are also increased markings obliquely in the right lower lobe and in the left lower lobe medially. There is no definite pneumonia. CBC and differential reveals a white count of 6100 with normal differential. Her hemoglobin and hematocrit have improved significantly. Hemoglobin is now 10.1 and hematocrit 32.9. Last checked 1/25 the values were 8.2 and 26.5 respectively.  Review of systems cannot be verified due to dementia. She repeatedly stated  it was nice to meet me despite the fact that I admitted her to the facility 09/26/17.  Constitutional: No fever, significant weight change, fatigue    Eyes: No redness, discharge, pain, vision change ENT/mouth: No nasal congestion,  purulent discharge, earache, change in hearing, sore throat  Cardiovascular: No chest pain, palpitations, paroxysmal nocturnal dyspnea, claudication, edema  Respiratory: No sputum production, hemoptysis Gastrointestinal: No heartburn, dysphagia Allergy/immunology: No itchy/watery eyes, significant sneezing, urticaria, angioedema  Physical exam:  Pertinent or positive findings: She looks chronically. She has weathered facies. She has an upper plate and lower partial. Neck veins are distended 4-5 centimeters sitting. A to-and-fro murmur is present. Breath sounds are asymmetric. She exhibits a rattly, nonproductive cough. Anteriorly initially bronchovesicular breath sounds were heard on the left. She had decreased breath sounds on the right with expiratory wheezing and rhonchi bilaterally. Breath sounds are somewhat decreased posteriorly. There were no significant dramatic changes in testing whispered pectoriloquy or tactile fremitus. The diaphragmatic excursion was decreased. She has one half-1+ edema at the sock line. Pedal pulses are decreased. She's wearing anti-wandering bracelet on the right ankle.  General appearance: no acute distress, increased work of breathing is present.   Lymphatic: No lymphadenopathy about the head, neck, axilla. Eyes: No conjunctival inflammation or lid edema is present. There is no scleral icterus. Ears:  External ear exam shows no significant lesions or deformities.   Nose:  External nasal examination shows no deformity or inflammation. Nasal mucosa are pink and moist without lesions, exudates Oral exam:  Lips and gums are healthy appearing. There is no oropharyngeal erythema or exudate. Neck:  No thyromegaly, masses, tenderness noted.    Heart:  No gallop, click, rub .  Abdomen:Bowel sounds are normal. Abdomen is soft and nontender with no organomegaly, hernias,masses. GU: deferred   Extremities:  No cyanosis Skin: Warm & dry w/o tenting. No  significant lesions or rash.  See summary under each active problem in the Problem List with associated updated therapeutic plan

## 2017-10-12 NOTE — Assessment & Plan Note (Signed)
COPD exacerbation suggested. Radiographs do not suggest pneumonia or heart failure. Continue doxycycline and add bursts steroids.

## 2017-10-13 DIAGNOSIS — F0390 Unspecified dementia without behavioral disturbance: Secondary | ICD-10-CM | POA: Insufficient documentation

## 2017-10-13 NOTE — Assessment & Plan Note (Signed)
Namenda and Aricept would not likely be efficacious @ 88

## 2017-10-13 NOTE — Assessment & Plan Note (Signed)
NVD present w/o clinical or radiographic CHF

## 2018-07-11 ENCOUNTER — Encounter (HOSPITAL_COMMUNITY): Payer: Self-pay | Admitting: Emergency Medicine

## 2018-07-11 ENCOUNTER — Emergency Department (HOSPITAL_COMMUNITY): Payer: Medicare Other

## 2018-07-11 ENCOUNTER — Other Ambulatory Visit: Payer: Self-pay

## 2018-07-11 ENCOUNTER — Observation Stay (HOSPITAL_COMMUNITY)
Admission: EM | Admit: 2018-07-11 | Discharge: 2018-07-14 | Disposition: A | Discharge status: Skilled Nursing Facility | Payer: Medicare Other | Attending: Internal Medicine | Admitting: Internal Medicine

## 2018-07-11 DIAGNOSIS — G8929 Other chronic pain: Secondary | ICD-10-CM | POA: Diagnosis not present

## 2018-07-11 DIAGNOSIS — Z7982 Long term (current) use of aspirin: Secondary | ICD-10-CM | POA: Diagnosis not present

## 2018-07-11 DIAGNOSIS — F329 Major depressive disorder, single episode, unspecified: Secondary | ICD-10-CM | POA: Insufficient documentation

## 2018-07-11 DIAGNOSIS — Z880 Allergy status to penicillin: Secondary | ICD-10-CM | POA: Insufficient documentation

## 2018-07-11 DIAGNOSIS — G2581 Restless legs syndrome: Secondary | ICD-10-CM | POA: Diagnosis not present

## 2018-07-11 DIAGNOSIS — I700 Atherosclerosis of aorta: Secondary | ICD-10-CM | POA: Diagnosis not present

## 2018-07-11 DIAGNOSIS — I110 Hypertensive heart disease with heart failure: Secondary | ICD-10-CM | POA: Diagnosis not present

## 2018-07-11 DIAGNOSIS — I2510 Atherosclerotic heart disease of native coronary artery without angina pectoris: Secondary | ICD-10-CM | POA: Diagnosis not present

## 2018-07-11 DIAGNOSIS — Z9981 Dependence on supplemental oxygen: Secondary | ICD-10-CM | POA: Diagnosis not present

## 2018-07-11 DIAGNOSIS — R0600 Dyspnea, unspecified: Secondary | ICD-10-CM | POA: Diagnosis present

## 2018-07-11 DIAGNOSIS — Z7951 Long term (current) use of inhaled steroids: Secondary | ICD-10-CM | POA: Insufficient documentation

## 2018-07-11 DIAGNOSIS — R05 Cough: Secondary | ICD-10-CM

## 2018-07-11 DIAGNOSIS — J101 Influenza due to other identified influenza virus with other respiratory manifestations: Principal | ICD-10-CM | POA: Insufficient documentation

## 2018-07-11 DIAGNOSIS — R059 Cough, unspecified: Secondary | ICD-10-CM

## 2018-07-11 DIAGNOSIS — R778 Other specified abnormalities of plasma proteins: Secondary | ICD-10-CM

## 2018-07-11 DIAGNOSIS — I5042 Chronic combined systolic (congestive) and diastolic (congestive) heart failure: Secondary | ICD-10-CM | POA: Insufficient documentation

## 2018-07-11 DIAGNOSIS — G9340 Encephalopathy, unspecified: Secondary | ICD-10-CM | POA: Diagnosis present

## 2018-07-11 DIAGNOSIS — F0390 Unspecified dementia without behavioral disturbance: Secondary | ICD-10-CM | POA: Insufficient documentation

## 2018-07-11 DIAGNOSIS — F17210 Nicotine dependence, cigarettes, uncomplicated: Secondary | ICD-10-CM | POA: Diagnosis not present

## 2018-07-11 DIAGNOSIS — Z8249 Family history of ischemic heart disease and other diseases of the circulatory system: Secondary | ICD-10-CM | POA: Diagnosis not present

## 2018-07-11 DIAGNOSIS — R7989 Other specified abnormal findings of blood chemistry: Secondary | ICD-10-CM

## 2018-07-11 DIAGNOSIS — M549 Dorsalgia, unspecified: Secondary | ICD-10-CM | POA: Insufficient documentation

## 2018-07-11 DIAGNOSIS — I456 Pre-excitation syndrome: Secondary | ICD-10-CM | POA: Insufficient documentation

## 2018-07-11 DIAGNOSIS — R9431 Abnormal electrocardiogram [ECG] [EKG]: Secondary | ICD-10-CM | POA: Diagnosis present

## 2018-07-11 DIAGNOSIS — J441 Chronic obstructive pulmonary disease with (acute) exacerbation: Secondary | ICD-10-CM | POA: Insufficient documentation

## 2018-07-11 LAB — BLOOD GAS, VENOUS
ACID-BASE EXCESS: 1.3 mmol/L (ref 0.0–2.0)
Bicarbonate: 26.4 mmol/L (ref 20.0–28.0)
O2 SAT: 49.8 %
Patient temperature: 98.6
pCO2, Ven: 46.2 mmHg (ref 44.0–60.0)
pH, Ven: 7.375 (ref 7.250–7.430)

## 2018-07-11 LAB — CBC WITH DIFFERENTIAL/PLATELET
Abs Immature Granulocytes: 0.02 10*3/uL (ref 0.00–0.07)
Basophils Absolute: 0 10*3/uL (ref 0.0–0.1)
Basophils Relative: 0 %
EOS PCT: 0 %
Eosinophils Absolute: 0 10*3/uL (ref 0.0–0.5)
HCT: 36.6 % (ref 36.0–46.0)
Hemoglobin: 11.4 g/dL — ABNORMAL LOW (ref 12.0–15.0)
Immature Granulocytes: 0 %
Lymphocytes Relative: 14 %
Lymphs Abs: 0.9 10*3/uL (ref 0.7–4.0)
MCH: 30.6 pg (ref 26.0–34.0)
MCHC: 31.1 g/dL (ref 30.0–36.0)
MCV: 98.1 fL (ref 80.0–100.0)
MONO ABS: 0.2 10*3/uL (ref 0.1–1.0)
MONOS PCT: 3 %
Neutro Abs: 5.4 10*3/uL (ref 1.7–7.7)
Neutrophils Relative %: 83 %
Platelets: 180 10*3/uL (ref 150–400)
RBC: 3.73 MIL/uL — ABNORMAL LOW (ref 3.87–5.11)
RDW: 13.2 % (ref 11.5–15.5)
WBC: 6.5 10*3/uL (ref 4.0–10.5)
nRBC: 0 % (ref 0.0–0.2)

## 2018-07-11 LAB — COMPREHENSIVE METABOLIC PANEL
ALBUMIN: 3.5 g/dL (ref 3.5–5.0)
ALK PHOS: 64 U/L (ref 38–126)
ALT: 19 U/L (ref 0–44)
AST: 39 U/L (ref 15–41)
Anion gap: 10 (ref 5–15)
BILIRUBIN TOTAL: 0.8 mg/dL (ref 0.3–1.2)
BUN: 29 mg/dL — AB (ref 8–23)
CO2: 25 mmol/L (ref 22–32)
Calcium: 8.3 mg/dL — ABNORMAL LOW (ref 8.9–10.3)
Chloride: 104 mmol/L (ref 98–111)
Creatinine, Ser: 1.2 mg/dL — ABNORMAL HIGH (ref 0.44–1.00)
GFR calc Af Amer: 46 mL/min — ABNORMAL LOW (ref >60)
GFR calc non Af Amer: 39 mL/min — ABNORMAL LOW (ref >60)
GLUCOSE: 109 mg/dL — AB (ref 70–99)
Potassium: 4.4 mmol/L (ref 3.5–5.1)
Sodium: 139 mmol/L (ref 135–145)
TOTAL PROTEIN: 6.7 g/dL (ref 6.5–8.1)

## 2018-07-11 LAB — URINALYSIS, ROUTINE W REFLEX MICROSCOPIC
BILIRUBIN URINE: NEGATIVE
GLUCOSE, UA: NEGATIVE mg/dL
Hgb urine dipstick: NEGATIVE
Ketones, ur: NEGATIVE mg/dL
Leukocytes, UA: NEGATIVE
Nitrite: NEGATIVE
PH: 5 (ref 5.0–8.0)
Protein, ur: NEGATIVE mg/dL
Specific Gravity, Urine: 1.02 (ref 1.005–1.030)

## 2018-07-11 LAB — TROPONIN I
Troponin I: 0.05 ng/mL (ref <0.03)
Troponin I: 0.05 ng/mL (ref <0.03)

## 2018-07-11 LAB — BRAIN NATRIURETIC PEPTIDE: B Natriuretic Peptide: 180.2 pg/mL — ABNORMAL HIGH (ref 0.0–100.0)

## 2018-07-11 MED ORDER — METHYLPREDNISOLONE SODIUM SUCC 125 MG IJ SOLR
125.0000 mg | Freq: Once | INTRAMUSCULAR | Status: AC
  Administered 2018-07-11: 125 mg via INTRAVENOUS
  Filled 2018-07-11: qty 2

## 2018-07-11 MED ORDER — ALPRAZOLAM 0.25 MG PO TABS
0.2500 mg | ORAL_TABLET | Freq: Once | ORAL | Status: AC
  Administered 2018-07-11: 0.25 mg via ORAL
  Filled 2018-07-11: qty 1

## 2018-07-11 MED ORDER — IPRATROPIUM-ALBUTEROL 0.5-2.5 (3) MG/3ML IN SOLN
3.0000 mL | Freq: Once | RESPIRATORY_TRACT | Status: AC
  Administered 2018-07-11: 3 mL via RESPIRATORY_TRACT
  Filled 2018-07-11: qty 3

## 2018-07-11 NOTE — ED Notes (Signed)
Doutova, MD made aware of VBG

## 2018-07-11 NOTE — ED Notes (Signed)
Unable

## 2018-07-11 NOTE — ED Provider Notes (Signed)
[facility]-EMERGENCY DEPT Provider Note   CSN: [id number] Arrival date & time: 07/11/18  1808     History   Chief Complaint Chief Complaint  Patient presents with  . Cough  . Shortness of Breath    HPI [name] is a 84 y.o. female.  84 year old female with prior medical history as detailed below presents for evaluation of cough and shortness of breath.  Majority of patient's history is obtained from the daughter at bedside.  Daughter reports that the patient has had increasing cough over the last 2 days.  Patient appears to be moderately more short of breath than her baseline.  No reported fevers.  No reported episodes of chest pain.  Patient with prior history significant for COPD.  Patient is not currently on antibiotics or prednisone.  The history is provided by the patient, medical records and a relative.  Cough  This is a new problem. The current episode started 2 days ago. The problem occurs constantly. The problem has not changed since onset.The cough is non-productive. There has been no fever. Associated symptoms include shortness of breath and wheezing.  Shortness of Breath  Associated symptoms include cough and wheezing.    Past Medical History:  Diagnosis Date  . Abnormal cardiovascular stress test 01/17/2013   Decreased LV function with mild distal anterior perfusion abnormality   . Acute combined systolic and diastolic heart failure (HCC) 01/17/2013  . Chronic combined systolic and diastolic CHF (congestive heart failure) (HCC) 08/17/2017  . COPD (chronic obstructive pulmonary disease) (HCC)   . Hypertension   . Restless leg syndrome   . Right lower lobe pneumonia (HCC) 08/17/2017  . Sepsis (HCC) 08/17/2017  . Tobacco use disorder   . WPW (Wolff-Parkinson-White syndrome)     Patient Active Problem List   Diagnosis Date Noted  . Elevated troponin 07/11/2018  . COPD with acute exacerbation (HCC) 07/11/2018  . Dementia (HCC)  10/13/2017  . At risk for adverse drug event 09/26/2017  . Normochromic normocytic anemia 09/26/2017  . Restless leg syndrome   . Protein-calorie malnutrition, severe 09/22/2017  . Closed left hip fracture, initial encounter (HCC) 09/19/2017  . Pneumococcal pneumonia (HCC) 08/26/2017  . Hypertension 08/26/2017  . COPD (chronic obstructive pulmonary disease) (HCC) 08/26/2017  . Psychosis (HCC) 08/26/2017  . Coronary artery disease 08/26/2017  . Sepsis (HCC) 08/17/2017  . Chronic combined systolic and diastolic CHF (congestive heart failure) (HCC) 08/17/2017  . Right lower lobe pneumonia (HCC) 08/17/2017  . Abnormal cardiovascular stress test 01/17/2013    Class: Acute  . Tobacco use disorder   . WPW (Wolff-Parkinson-White syndrome)     Past Surgical History:  Procedure Laterality Date  . APPENDECTOMY    . BACK SURGERY     MULTIPLE  . HEMANGIOMA EXCISION    . HIP ARTHROPLASTY Left 09/20/2017   Procedure: ARTHROPLASTY BIPOLAR HIP (HEMIARTHROPLASTY) LEFT;  Surgeon: Ollen Gross, MD;  Location: [facility] ORS;  Service: Orthopedics;  Laterality: Left;  . KIDNEY SURGERY     RT KIDNEY TACK  . NECK SURGERY    . TONSILLECTOMY       OB History   None      Home Medications    Prior to Admission medications   Medication Sig Start Date End Date Taking? Authorizing Provider  acetaminophen (TYLENOL) 500 MG tablet Take 500 mg by mouth every 6 (six) hours as needed for moderate pain.   Yes [provider]  ACIDOPHILUS LACTOBACILLUS PO Take 1 tablet by mouth  every Monday, Wednesday, and Friday.   Yes [provider]  albuterol (PROVENTIL HFA;VENTOLIN HFA) 108 (90 Base) MCG/ACT inhaler Inhale 2 puffs into the lungs every 6 (six) hours as needed for wheezing or shortness of breath.   Yes [provider]  ALPRAZolam (XANAX) 0.25 MG tablet Take 0.25 mg by mouth 2 (two) times daily.   Yes [provider]  aspirin EC 81 MG tablet Take 81 mg by mouth daily.   Yes  [provider]  B Complex Vitamins (B COMPLEX-B12) TABS Take 1 tablet by mouth daily.   Yes [provider]  cholecalciferol (VITAMIN D3) 25 MCG (1000 UT) tablet Take 1,000 Units by mouth daily.   Yes [provider]  famotidine (PEPCID) 10 MG tablet Take 10 mg by mouth every 12 (twelve) hours as needed for heartburn or indigestion.   Yes [provider]  fentaNYL (DURAGESIC - DOSED MCG/HR) 25 MCG/HR patch Place 1 patch (25 mcg total) onto the skin every 3 (three) days. 09/23/17  Yes Albertine GratesXu, Fang, MD  ferrous sulfate 325 (65 FE) MG tablet Take 325 mg by mouth every Monday, Wednesday, and Friday.    Yes [provider]  fluticasone furoate-vilanterol (BREO ELLIPTA) 200-25 MCG/INH AEPB Inhale 1 puff into the lungs at bedtime.   Yes [provider]  guaiFENesin (MUCINEX) 600 MG 12 hr tablet Take 600 mg by mouth every 12 (twelve) hours as needed for cough or to loosen phlegm.   Yes [provider]  ipratropium-albuterol (DUONEB) 0.5-2.5 (3) MG/3ML SOLN Take 3 mLs by nebulization every 6 (six) hours as needed (shortness of breath).    Yes [provider]  isosorbide mononitrate (IMDUR) 30 MG 24 hr tablet Take 30 mg by mouth daily.   Yes [provider]  loratadine (CLARITIN) 10 MG tablet Take 10 mg by mouth daily.   Yes [provider]  losartan (COZAAR) 50 MG tablet Take 50 mg by mouth daily.   Yes [provider]  magic mouthwash w/lidocaine SOLN Take 15 mLs by mouth 4 (four) times daily as needed for mouth pain.   Yes [provider]  Magnesium 250 MG TABS Take 1 tablet by mouth daily.   Yes [provider]  methocarbamol (ROBAXIN) 500 MG tablet Take 0.5 tablets (250 mg total) by mouth at bedtime as needed. 09/22/17  Yes Perkins, Alexzandrew L, PA-C  nitroGLYCERIN (NITROSTAT) 0.4 MG SL tablet Place 1 tablet (0.4 mg total) under the tongue every 5 (five) minutes x 3 doses as needed for chest  pain. 12/22/12  Yes Corky CraftsVaranasi, Jayadeep S, MD  ondansetron (ZOFRAN) 4 MG tablet Take 4 mg by mouth every 8 (eight) hours as needed for nausea or vomiting.   Yes [provider]  OXYGEN Inhale 1 L into the lungs as needed (oxygen saturation 87% or below).    Yes [provider]  pantoprazole (PROTONIX) 40 MG tablet Take 40 mg by mouth 2 (two) times daily.   Yes [provider]  polyethylene glycol (MIRALAX / GLYCOLAX) packet Take 17 g by mouth daily. 09/22/17  Yes Albertine GratesXu, Fang, MD  pramipexole (MIRAPEX) 0.5 MG tablet Take 0.5 mg by mouth daily as needed.   Yes [provider]  pramipexole (MIRAPEX) 1 MG tablet Take 1 mg by mouth at bedtime.    Yes [provider]  QUEtiapine (SEROQUEL) 100 MG tablet Take 1 tablet (100 mg total) by mouth at bedtime. Only gives 100 mg four times a week Patient  taking differently: Take 50-100 mg by mouth See admin instructions. Take 50 mg by mouth in the morning and 100 mg by mouth at bedtime 08/21/17  Yes Alison Murray, MD  sennosides-docusate sodium (SENOKOT-S) 8.6-50 MG tablet Take 1 tablet by mouth 2 (two) times daily.    Yes [provider]  sertraline (ZOLOFT) 25 MG tablet Take 1 tablet (25 mg total) by mouth at bedtime. 08/21/17  Yes Alison Murray, MD  traMADol (ULTRAM) 50 MG tablet Take 50 mg by mouth every 6 (six) hours as needed for moderate pain.    Yes [provider]  tamsulosin (FLOMAX) 0.4 MG CAPS capsule Take 1 capsule (0.4 mg total) by mouth daily. Patient not taking: Reported on 07/11/2018 09/23/17   Albertine Grates, MD    Family History History reviewed. No pertinent family history.  Social History Social History   Tobacco Use  . Smoking status: Current Every Day Smoker  . Smokeless tobacco: Never Used  Substance Use Topics  . Alcohol use: No  . Drug use: No     Allergies   Amoxicillin; Ciprofloxacin; Lorabid [loracarbef]; Avelox [moxifloxacin]; Erythromycin; Inderal [propranolol]; and  Chantix [varenicline]   Review of Systems Review of Systems  Respiratory: Positive for cough, shortness of breath and wheezing.   All other systems reviewed and are negative.    Physical Exam Updated Vital Signs BP 131/63   Pulse (!) 109   Temp 98.4 F (36.9 C) (Oral)   Resp 19   SpO2 (!) 84%   Physical Exam  Constitutional: She is oriented to person, place, and time. She appears well-developed. No distress.  Medically ill in appearance Mildly agitated  HENT:  Head: Normocephalic and atraumatic.  Mouth/Throat: Oropharynx is clear and moist.  Eyes: Pupils are equal, round, and reactive to light. Conjunctivae and EOM are normal.  Neck: Normal range of motion. Neck supple.  Cardiovascular: Normal rate, regular rhythm and normal heart sounds.  Pulmonary/Chest: Effort normal and breath sounds normal. No respiratory distress.  Mild diffuse expiratory wheezes  Abdominal: Soft. She exhibits no distension. There is no tenderness.  Musculoskeletal: Normal range of motion. She exhibits no edema or deformity.  Neurological: She is alert and oriented to person, place, and time.  Skin: Skin is warm and dry.  Psychiatric: She has a normal mood and affect.  Nursing note and vitals reviewed.    ED Treatments / Results  Labs (all labs ordered are listed, but only abnormal results are displayed) Labs Reviewed  URINALYSIS, ROUTINE W REFLEX MICROSCOPIC - Abnormal; Notable for the following components:      Result Value   Color, Urine AMBER (*)    APPearance HAZY (*)    All other components within normal limits  CBC WITH DIFFERENTIAL/PLATELET - Abnormal; Notable for the following components:   RBC 3.73 (*)    Hemoglobin 11.4 (*)    All other components within normal limits  COMPREHENSIVE METABOLIC PANEL - Abnormal; Notable for the following components:   Glucose, Bld 109 (*)    BUN 29 (*)    Creatinine, Ser 1.20 (*)    Calcium 8.3 (*)    GFR calc non Af Amer 39 (*)    GFR calc Af  Amer 46 (*)    All other components within normal limits  BRAIN NATRIURETIC PEPTIDE - Abnormal; Notable for the following components:   B Natriuretic Peptide 180.2 (*)    All other components within normal limits  TROPONIN I - Abnormal; Notable for the  following components:   Troponin I 0.05 (*)    All other components within normal limits  CULTURE, BLOOD (ROUTINE X 2)  CULTURE, BLOOD (ROUTINE X 2)    EKG EKG Interpretation  Date/Time:  Wednesday July 11 2018 20:31:31 EST Ventricular Rate:  100 PR Interval:    QRS Duration: 91 QT Interval:  396 QTC Calculation: 511 R Axis:   57 Text Interpretation:  Sinus tachycardia Consider right atrial enlargement Anteroseptal infarct, age indeterminate Repol abnrm suggests ischemia, lateral leads Prolonged QT interval Baseline wander in lead(s) V2 V4 V5 Confirmed by Kristine Royal [phone]) on 07/11/2018 8:42:56 PM   Radiology Dg Chest Port 1 View  Result Date: 07/11/2018 CLINICAL DATA:  84 year old female with productive cough and shortness of breath EXAM: PORTABLE CHEST 1 VIEW COMPARISON:  Prior chest x-ray 09/19/2017 FINDINGS: Stable cardiomegaly. Atherosclerotic calcifications again noted in the transverse aorta. No focal airspace consolidation to suggest pneumonia. No pulmonary edema, pleural effusion or pneumothorax. Stable chronic bronchitic changes. No acute osseous abnormality. Epidural spinal stimulator. Incompletely imaged anterior cervical stabilization hardware. Right humeral head soft tissue anchor. No acute osseous abnormality. IMPRESSION: Stable chest x-ray without evidence of acute cardiopulmonary process. Cardiomegaly and Aortic Atherosclerosis (ICD10-170.0). Electronically Signed   By: Malachy Moan M.D.   On: 07/11/2018 20:44    Procedures Procedures (including critical care time)  Medications Ordered in ED Medications  ipratropium-albuterol (DUONEB) 0.5-2.5 (3) MG/3ML nebulizer solution 3 mL (3 mLs Nebulization  Given 07/11/18 1846)  ALPRAZolam (XANAX) tablet 0.25 mg (0.25 mg Oral Given 07/11/18 2018)  methylPREDNISolone sodium succinate (SOLU-MEDROL) 125 mg/2 mL injection 125 mg (125 mg Intravenous Given 07/11/18 2113)     Initial Impression / Assessment and Plan / ED Course  I have reviewed the triage vital signs and the nursing notes.  Pertinent labs & imaging results that were available during my care of the patient were reviewed by me and considered in my medical decision making (see chart for details).     MDM  Screen complete  Patient is presenting for evaluation of cough and shortness of breath.  Patient's presentation is most consistent with likely COPD exacerbation.  Screening labs are remarkable however for mildly elevated troponin.  Patient without reported history of chest pain.  EKG is without evidence of acute ischemia.  Patient will be admitted to the hospitalist service for further work-up and evaluation.  Patient and patient's daughter understand plan of care.  Patient's daughter confirms that the patient has a valid DNR/DNI.    Final Clinical Impressions(s) / ED Diagnoses   Final diagnoses:  Dyspnea, unspecified type  Cough  Elevated troponin    ED Discharge Orders    None       Wynetta Fines, MD 07/11/18 2212

## 2018-07-11 NOTE — ED Notes (Signed)
Patient refusing second set of blood cultures.

## 2018-07-11 NOTE — ED Triage Notes (Signed)
Facility reports non productive cough and shortness of breath. EMS gave a duo neb and solumedrol. Patient was combative with EMS.  128/ 84 BP 100 HR

## 2018-07-11 NOTE — ED Notes (Signed)
Bed: WA17 Expected date:  Expected time:  Means of arrival:  Comments: EMS/wheezing

## 2018-07-11 NOTE — H&P (Addendum)
[name]:[id number] DOB: 09-17-1928 DOA: 07/11/2018     PCP: [name], MD   Outpatient Specialists:   CARDS:   Dr. Verdis Prime in the past  [facility]  Patient arrived to ER on 07/11/18 at 1808  Patient coming from:   From facility [facility]  Chief Complaint:  Chief Complaint  Patient presents with  . Cough  . Shortness of Breath    HPI: [name] is a 84 y.o. female with medical history significant of  Dementia , COPD on PRN 1-2 L of O2, Systolic CHF     Presented with   shortness of breath and nonproductive cough EMS was called with facility administered DuoNeb and Solu-Medrol initially patient combative with EMS initial blood pressure 128/84 heart rate 100 Have been wheezing, not cooperative at the facility not been on oxygen since MArch. Family reports fever up to 100 last night had a Flu shot 6 days ago.  With  dementia unable to provide detailed history but denies any chest pain  Regarding pertinent Chronic problems: echo EF 25-30% in January 2019   While in ER: BNP 180 Trop 0.05 CXR (-) The following Work up has been ordered so far:  Orders Placed This Encounter  Procedures  . Culture, blood (routine x 2)  . DG Chest Port 1 View  . Urinalysis, Routine w reflex microscopic  . CBC with Differential  . Comprehensive metabolic panel  . Brain natriuretic peptide  . Troponin I - Once  . Cardiac monitoring  . In and Out Cath  . Consult to hospitalist  . ED EKG  . EKG 12-Lead  . Saline lock IV      Following Medications were ordered in ER: Medications  ipratropium-albuterol (DUONEB) 0.5-2.5 (3) MG/3ML nebulizer solution 3 mL (3 mLs Nebulization Given 07/11/18 1846)  ALPRAZolam (XANAX) tablet 0.25 mg (0.25 mg Oral Given 07/11/18 2018)  methylPREDNISolone sodium succinate (SOLU-MEDROL) 125 mg/2 mL injection 125 mg (125 mg Intravenous Given 07/11/18 2113)    Significant initial  Findings: Abnormal Labs  Reviewed  URINALYSIS, ROUTINE W REFLEX MICROSCOPIC - Abnormal; Notable for the following components:      Result Value   Color, Urine AMBER (*)    APPearance HAZY (*)    All other components within normal limits  CBC WITH DIFFERENTIAL/PLATELET - Abnormal; Notable for the following components:   RBC 3.73 (*)    Hemoglobin 11.4 (*)    All other components within normal limits  COMPREHENSIVE METABOLIC PANEL - Abnormal; Notable for the following components:   Glucose, Bld 109 (*)    BUN 29 (*)    Creatinine, Ser 1.20 (*)    Calcium 8.3 (*)    GFR calc non Af Amer 39 (*)    GFR calc Af Amer 46 (*)    All other components within normal limits  BRAIN NATRIURETIC PEPTIDE - Abnormal; Notable for the following components:   B Natriuretic Peptide 180.2 (*)    All other components within normal limits  TROPONIN I - Abnormal; Notable for the following components:   Troponin I 0.05 (*)    All other components within normal limits     Na 139 K 4.4   Cr   Up from baseline see below Lab Results  Component Value Date   CREATININE 1.20 (H) 07/11/2018   CREATININE 0.6 10/12/2017   CREATININE 0.80 09/22/2017      WBC  6.5   HG/HCT stable,  Component Value Date/Time   HGB 11.4 (L) 07/11/2018 1907   HCT 36.6 07/11/2018 1907   Troponin trop 0.05   BNP (last 3 results) Recent Labs    07/11/18 1907  BNP 180.2*    ProBNP (last 3 results) No results for input(s): PROBNP in the last 8760 hours.  Lactic Acid, Venous    Component Value Date/Time   LATICACIDVEN 1.1 08/20/2017 1502     UA  no evidence of UTI   CXR - NON acute  ECG:  Personally reviewed by me showing: HR : 100 Rhythm:   Sinus tachycardia delta waves  nonspecific changes,   QTC 511     ED Triage Vitals [07/11/18 1828]  Enc Vitals Group     BP 107/80     Pulse Rate (!) 115     Resp 20     Temp 98.4 F (36.9 C)     Temp Source Oral     SpO2 93 %     Weight      Height      Head Circumference       Peak Flow      Pain Score      Pain Loc      Pain Edu?      Excl. in GC?   ZOXW(96)@       Latest  Blood pressure 131/63, pulse (!) 109, temperature 98.4 F (36.9 C), temperature source Oral, resp. rate 19, SpO2 (!) 84 %.   Hospitalist was called for admission for COPD exacerbation and elevated troponin   Review of Systems:    Pertinent positives include:  fatigue, non-productive cough, shortness of breath at rest.wheezing. Constitutional:  No weight loss, night sweats, Fevers, chills,weight loss  HEENT:  No headaches, Difficulty swallowing,Tooth/dental problems,Sore throat,  No sneezing, itching, ear ache, nasal congestion, post nasal drip,  Cardio-vascular:  No chest pain, Orthopnea, PND, anasarca, dizziness, palpitations.no Bilateral lower extremity swelling  GI:  No heartburn, indigestion, abdominal pain, nausea, vomiting, diarrhea, change in bowel habits, loss of appetite, melena, blood in stool, hematemesis Resp:  no  No dyspnea on exertion, No excess mucus, no productive cough, No  No coughing up of blood.No change in color of mucus.No  Skin:  no rash or lesions. No jaundice GU:  no dysuria, change in color of urine, no urgency or frequency. No straining to urinate.  No flank pain.  Musculoskeletal:  No joint pain or no joint swelling. No decreased range of motion. No back pain.  Psych:  No change in mood or affect. No depression or anxiety. No memory loss.  Neuro: no localizing neurological complaints, no tingling, no weakness, no double vision, no gait abnormality, no slurred speech, no confusion  All systems reviewed and apart from HOPI all are negative  Past Medical History:   Past Medical History:  Diagnosis Date  . Abnormal cardiovascular stress test 01/17/2013   Decreased LV function with mild distal anterior perfusion abnormality   . Acute combined systolic and diastolic heart failure (HCC) 01/17/2013  . Chronic combined systolic and diastolic CHF  (congestive heart failure) (HCC) 08/17/2017  . COPD (chronic obstructive pulmonary disease) (HCC)   . Hypertension   . Restless leg syndrome   . Right lower lobe pneumonia (HCC) 08/17/2017  . Sepsis (HCC) 08/17/2017  . Tobacco use disorder   . WPW (Wolff-Parkinson-White syndrome)       Past Surgical History:  Procedure Laterality Date  . APPENDECTOMY    . BACK SURGERY  MULTIPLE  . HEMANGIOMA EXCISION    . HIP ARTHROPLASTY Left 09/20/2017   Procedure: ARTHROPLASTY BIPOLAR HIP (HEMIARTHROPLASTY) LEFT;  Surgeon: Ollen Gross, MD;  Location: [facility];  Service: Orthopedics;  Laterality: Left;  . KIDNEY SURGERY     RT KIDNEY TACK  . NECK SURGERY    . TONSILLECTOMY      Social History:  Ambulatory   independently       reports that she has been smoking. She has never used smokeless tobacco. She reports that she does not drink alcohol or use drugs.     Family History:   Family History  Problem Relation Age of Onset  . Stomach cancer Mother   . CAD Father   . Diabetes Father   . Hypertension Father   . Hypertension Daughter   . Stroke Neg Hx     Allergies: Allergies  Allergen Reactions  . Amoxicillin Anaphylaxis and Rash  . Ciprofloxacin Anaphylaxis and Other (See Comments)    Thrush  . Lorabid [Loracarbef] Anaphylaxis and Rash  . Avelox [Moxifloxacin] Other (See Comments)    Tendon and joint pain  . Erythromycin Other (See Comments)    angioedema  . Inderal [Propranolol] Other (See Comments)    Vasculitis  . Chantix [Varenicline] Nausea Only     Prior to Admission medications   Medication Sig Start Date End Date Taking? Authorizing Provider  acetaminophen (TYLENOL) 500 MG tablet Take 500 mg by mouth every 6 (six) hours as needed for moderate pain.   Yes [provider]  ACIDOPHILUS LACTOBACILLUS PO Take 1 tablet by mouth every Monday, Wednesday, and Friday.   Yes [provider]  albuterol (PROVENTIL HFA;VENTOLIN HFA) 108 (90 Base)  MCG/ACT inhaler Inhale 2 puffs into the lungs every 6 (six) hours as needed for wheezing or shortness of breath.   Yes [provider]  ALPRAZolam (XANAX) 0.25 MG tablet Take 0.25 mg by mouth 2 (two) times daily.   Yes [provider]  aspirin EC 81 MG tablet Take 81 mg by mouth daily.   Yes [provider]  B Complex Vitamins (B COMPLEX-B12) TABS Take 1 tablet by mouth daily.   Yes [provider]  cholecalciferol (VITAMIN D3) 25 MCG (1000 UT) tablet Take 1,000 Units by mouth daily.   Yes [provider]  famotidine (PEPCID) 10 MG tablet Take 10 mg by mouth every 12 (twelve) hours as needed for heartburn or indigestion.   Yes [provider]  fentaNYL (DURAGESIC - DOSED MCG/HR) 25 MCG/HR patch Place 1 patch (25 mcg total) onto the skin every 3 (three) days. 09/23/17  Yes Albertine Grates, MD  ferrous sulfate 325 (65 FE) MG tablet Take 325 mg by mouth every Monday, Wednesday, and Friday.    Yes [provider]  fluticasone furoate-vilanterol (BREO ELLIPTA) 200-25 MCG/INH AEPB Inhale 1 puff into the lungs at bedtime.   Yes [provider]  guaiFENesin (MUCINEX) 600 MG 12 hr tablet Take 600 mg by mouth every 12 (twelve) hours as needed for cough or to loosen phlegm.   Yes [provider]  ipratropium-albuterol (DUONEB) 0.5-2.5 (3) MG/3ML SOLN Take 3 mLs by nebulization every 6 (six) hours as needed (shortness of breath).    Yes [provider]  isosorbide mononitrate (IMDUR) 30 MG 24 hr tablet Take 30 mg by mouth daily.   Yes [provider]  loratadine (CLARITIN) 10 MG tablet Take 10 mg by mouth daily.   Yes [provider]  losartan (  COZAAR) 50 MG tablet Take 50 mg by mouth daily.   Yes [provider]  magic mouthwash w/lidocaine SOLN Take 15 mLs by mouth 4 (four) times daily as needed for mouth pain.   Yes [provider]  Magnesium 250 MG TABS Take 1 tablet by mouth daily.   Yes  [provider]  methocarbamol (ROBAXIN) 500 MG tablet Take 0.5 tablets (250 mg total) by mouth at bedtime as needed. 09/22/17  Yes Perkins, Alexzandrew L, PA-C  nitroGLYCERIN (NITROSTAT) 0.4 MG SL tablet Place 1 tablet (0.4 mg total) under the tongue every 5 (five) minutes x 3 doses as needed for chest pain. 12/22/12  Yes Corky CraftsVaranasi, Jayadeep S, MD  ondansetron (ZOFRAN) 4 MG tablet Take 4 mg by mouth every 8 (eight) hours as needed for nausea or vomiting.   Yes [provider]  OXYGEN Inhale 1 L into the lungs as needed (oxygen saturation 87% or below).    Yes [provider]  pantoprazole (PROTONIX) 40 MG tablet Take 40 mg by mouth 2 (two) times daily.   Yes [provider]  polyethylene glycol (MIRALAX / GLYCOLAX) packet Take 17 g by mouth daily. 09/22/17  Yes Albertine GratesXu, Fang, MD  pramipexole (MIRAPEX) 0.5 MG tablet Take 0.5 mg by mouth daily as needed.   Yes [provider]  pramipexole (MIRAPEX) 1 MG tablet Take 1 mg by mouth at bedtime.    Yes [provider]  QUEtiapine (SEROQUEL) 100 MG tablet Take 1 tablet (100 mg total) by mouth at bedtime. Only gives 100 mg four times a week Patient taking differently: Take 50-100 mg by mouth See admin instructions. Take 50 mg by mouth in the morning and 100 mg by mouth at bedtime 08/21/17  Yes Alison Murrayevine, Alma M, MD  sennosides-docusate sodium (SENOKOT-S) 8.6-50 MG tablet Take 1 tablet by mouth 2 (two) times daily.    Yes [provider]  sertraline (ZOLOFT) 25 MG tablet Take 1 tablet (25 mg total) by mouth at bedtime. 08/21/17  Yes Alison Murrayevine, Alma M, MD  traMADol (ULTRAM) 50 MG tablet Take 50 mg by mouth every 6 (six) hours as needed for moderate pain.    Yes [provider]  tamsulosin (FLOMAX) 0.4 MG CAPS capsule Take 1 capsule (0.4 mg total) by mouth daily. Patient not taking: Reported on 07/11/2018 09/23/17   Albertine GratesXu, Fang, MD   Physical Exam: Blood pressure 131/63, pulse (!) 109, temperature 98.4 F  (36.9 C), temperature source Oral, resp. rate 19, SpO2 (!) 84 %. 1. General:  in No Acute distress   Chronically ill -appearing 2. Psychological: Alert but not  Oriented 3. Head/ENT:     Dry Mucous Membranes                          Head Non traumatic, neck supple                         Poor Dentition 4. SKIN:   decreased Skin turgor,  Skin clean Dry and intact no rash 5. Heart: Regular rate and rhythm no Murmur, no Rub or gallop 6. Lungs: some  Wheezes extensive  crackles   7. Abdomen: Soft,  non-tender, slightly  distended  bowel sounds present 8. Lower extremities: no clubbing, cyanosis, or  edema 9. Neurologically Grossly intact, moving all 4 extremities equally   10. MSK: Normal range of motion   LABS:     Recent Labs  Lab 07/11/18 1907  WBC 6.5  NEUTROABS 5.4  HGB 11.4*  HCT 36.6  MCV 98.1  PLT 180   Basic Metabolic Panel: Recent Labs  Lab 07/11/18 1907  NA 139  K 4.4  CL 104  CO2 25  GLUCOSE 109*  BUN 29*  CREATININE 1.20*  CALCIUM 8.3*      Recent Labs  Lab 07/11/18 1907  AST 39  ALT 19  ALKPHOS 64  BILITOT 0.8  PROT 6.7  ALBUMIN 3.5   No results for input(s): LIPASE, AMYLASE in the last 168 hours. No results for input(s): AMMONIA in the last 168 hours.    HbA1C: No results for input(s): HGBA1C in the last 72 hours. CBG: No results for input(s): GLUCAP in the last 168 hours.    Urine analysis:    Component Value Date/Time   COLORURINE AMBER (A) 07/11/2018 2038   APPEARANCEUR HAZY (A) 07/11/2018 2038   LABSPEC 1.020 07/11/2018 2038   PHURINE 5.0 07/11/2018 2038   GLUCOSEU NEGATIVE 07/11/2018 2038   HGBUR NEGATIVE 07/11/2018 2038   BILIRUBINUR NEGATIVE 07/11/2018 2038   KETONESUR NEGATIVE 07/11/2018 2038   PROTEINUR NEGATIVE 07/11/2018 2038   UROBILINOGEN 0.2 04/25/2014 1827   NITRITE NEGATIVE 07/11/2018 2038   LEUKOCYTESUR NEGATIVE 07/11/2018 2038    Cultures:    Component Value Date/Time   SDES URINE, RANDOM 09/20/2017 1015     SPECREQUEST NONE 09/20/2017 1015   CULT  09/20/2017 1015    NO GROWTH Performed at [facility], [address] [place], [place] [address]    REPTSTATUS 09/21/2017 FINAL 09/20/2017 1015     Radiological Exams on Admission: Dg Chest Port 1 View  Result Date: 07/11/2018 CLINICAL DATA:  84 year old female with productive cough and shortness of breath EXAM: PORTABLE CHEST 1 VIEW COMPARISON:  Prior chest x-ray 09/19/2017 FINDINGS: Stable cardiomegaly. Atherosclerotic calcifications again noted in the transverse aorta. No focal airspace consolidation to suggest pneumonia. No pulmonary edema, pleural effusion or pneumothorax. Stable chronic bronchitic changes. No acute osseous abnormality. Epidural spinal stimulator. Incompletely imaged anterior cervical stabilization hardware. Right humeral head soft tissue anchor. No acute osseous abnormality. IMPRESSION: Stable chest x-ray without evidence of acute cardiopulmonary process. Cardiomegaly and Aortic Atherosclerosis (ICD10-170.0). Electronically Signed   By: Malachy Moan M.D.   On: 07/11/2018 20:44    Chart has been reviewed    Assessment/Plan   84 y.o. female with medical history significant of  Dementia , COPD on PRN 1-2 L of O2, Systolic CHF  Admitted for COPD exacerbation and elevated troponin   Present on Admission:   . COPD with acute exacerbation (HCC) -     Will initiate: Steroid taper  -  Antibiotics   Doxycycline, - XopenexPRN, - scheduled duoneb,  - restart home meds at discharge   -  Mucinex.  Titrate O2 to saturation >90%. Follow patients respiratory status.  Order respiratory panel and influenza PCR - check VBG   Currently mentating well no evidence of symptomatic hypercarbia  . Elevated troponin - suspect demand ischemia in the setting of COPD exacerbation, possibly transient hypoxia patient has not been tolerating oxygen.  Supportive management.  Obtain echogram in a.m. appreciate cardiology input  discussed with family at this point they would like to avoid aggressive interventions.  Continue to monitor her troponin overnight Monitor on telemetry . Acute encephalopathy -   - most likely multifactorial secondary to combination of  Infection (URI) and history of dementia    - Hold contributing medications      -  neurological exam appears to be nonfocal but patient unable to cooperate fully   - VBG ordered   - no history of liver disease  Likely sundowning and setting of dementia  . WPW (Wolff-Parkinson-White syndrome) appreciate cardiology input observe in telemetry currently hemodynamically stable of note IV digoxin or amiodarone and intravenous or oral beta blockers, diltiazem, and verapamil are potentially harmful for treatment in patients with pre-excited atrial fibrillation . Coronary artery disease stable continue aspirin and statin not on beta-blocker . Dementia (HCC) expect some degree of sundowning continue to monitor supportive management Chronic back pain continue home medications including fentanyl Other plan as per orders. CHF systolic EF of 25-30% currently appears to be euvolemic appreciate cardiology input will repeat echogram given elevated troponin DVT prophylaxis:   Lovenox     Code Status:    DNR/DNI  as per family avoid aggressive interventions I had personally discussed CODE STATUS with patient and family  Family Communication:   Family   at  Bedside  plan of care was discussed with   Daughter,who is MPOA  Disposition Plan:                             Back to current facility when stable                      Consults called: email cardiology     Admission status:   Obs    Level of care    tele   24H              Revella Shelton 07/11/2018, 11:26 PM    [facility]  Pager [phone]   after 2 AM please page floor coverage PA If 7AM-7PM, please contact the day team taking care of the patient  [email or web address]  Password [email or web address]

## 2018-07-11 NOTE — ED Notes (Signed)
Patient pulling at equipment and refusing treatment. MD notified.

## 2018-07-12 ENCOUNTER — Observation Stay (HOSPITAL_BASED_OUTPATIENT_CLINIC_OR_DEPARTMENT_OTHER): Payer: Medicare Other

## 2018-07-12 DIAGNOSIS — I248 Other forms of acute ischemic heart disease: Secondary | ICD-10-CM

## 2018-07-12 DIAGNOSIS — I2510 Atherosclerotic heart disease of native coronary artery without angina pectoris: Secondary | ICD-10-CM | POA: Diagnosis not present

## 2018-07-12 DIAGNOSIS — R7989 Other specified abnormal findings of blood chemistry: Secondary | ICD-10-CM | POA: Diagnosis not present

## 2018-07-12 DIAGNOSIS — I456 Pre-excitation syndrome: Secondary | ICD-10-CM | POA: Diagnosis not present

## 2018-07-12 DIAGNOSIS — F0390 Unspecified dementia without behavioral disturbance: Secondary | ICD-10-CM | POA: Diagnosis not present

## 2018-07-12 DIAGNOSIS — J441 Chronic obstructive pulmonary disease with (acute) exacerbation: Secondary | ICD-10-CM | POA: Diagnosis not present

## 2018-07-12 DIAGNOSIS — R9431 Abnormal electrocardiogram [ECG] [EKG]: Secondary | ICD-10-CM | POA: Diagnosis not present

## 2018-07-12 DIAGNOSIS — R05 Cough: Secondary | ICD-10-CM | POA: Diagnosis not present

## 2018-07-12 DIAGNOSIS — J101 Influenza due to other identified influenza virus with other respiratory manifestations: Secondary | ICD-10-CM | POA: Diagnosis not present

## 2018-07-12 DIAGNOSIS — G9340 Encephalopathy, unspecified: Secondary | ICD-10-CM | POA: Diagnosis not present

## 2018-07-12 DIAGNOSIS — I5030 Unspecified diastolic (congestive) heart failure: Secondary | ICD-10-CM | POA: Diagnosis not present

## 2018-07-12 DIAGNOSIS — R0600 Dyspnea, unspecified: Secondary | ICD-10-CM | POA: Diagnosis not present

## 2018-07-12 LAB — COMPREHENSIVE METABOLIC PANEL
ALK PHOS: 57 U/L (ref 38–126)
ALT: 20 U/L (ref 0–44)
ANION GAP: 13 (ref 5–15)
AST: 35 U/L (ref 15–41)
Albumin: 3.7 g/dL (ref 3.5–5.0)
BILIRUBIN TOTAL: 0.6 mg/dL (ref 0.3–1.2)
BUN: 27 mg/dL — ABNORMAL HIGH (ref 8–23)
CALCIUM: 8.7 mg/dL — AB (ref 8.9–10.3)
CO2: 24 mmol/L (ref 22–32)
Chloride: 104 mmol/L (ref 98–111)
Creatinine, Ser: 1.18 mg/dL — ABNORMAL HIGH (ref 0.44–1.00)
GFR calc non Af Amer: 40 mL/min — ABNORMAL LOW (ref >60)
GFR, EST AFRICAN AMERICAN: 47 mL/min — AB (ref >60)
Glucose, Bld: 193 mg/dL — ABNORMAL HIGH (ref 70–99)
Potassium: 4.7 mmol/L (ref 3.5–5.1)
Sodium: 141 mmol/L (ref 135–145)
TOTAL PROTEIN: 6.7 g/dL (ref 6.5–8.1)

## 2018-07-12 LAB — RESPIRATORY PANEL BY PCR
Adenovirus: NOT DETECTED
BORDETELLA PERTUSSIS-RVPCR: NOT DETECTED
CHLAMYDOPHILA PNEUMONIAE-RVPPCR: NOT DETECTED
Coronavirus 229E: NOT DETECTED
Coronavirus HKU1: NOT DETECTED
Coronavirus NL63: NOT DETECTED
Coronavirus OC43: NOT DETECTED
INFLUENZA B-RVPPCR: NOT DETECTED
Influenza A: NOT DETECTED
MYCOPLASMA PNEUMONIAE-RVPPCR: NOT DETECTED
Metapneumovirus: NOT DETECTED
PARAINFLUENZA VIRUS 3-RVPPCR: NOT DETECTED
PARAINFLUENZA VIRUS 4-RVPPCR: NOT DETECTED
Parainfluenza Virus 1: NOT DETECTED
Parainfluenza Virus 2: NOT DETECTED
RHINOVIRUS / ENTEROVIRUS - RVPPCR: NOT DETECTED
Respiratory Syncytial Virus: NOT DETECTED

## 2018-07-12 LAB — PHOSPHORUS: Phosphorus: 3.9 mg/dL (ref 2.5–4.6)

## 2018-07-12 LAB — CBC
HCT: 38.9 % (ref 36.0–46.0)
Hemoglobin: 11.8 g/dL — ABNORMAL LOW (ref 12.0–15.0)
MCH: 30 pg (ref 26.0–34.0)
MCHC: 30.3 g/dL (ref 30.0–36.0)
MCV: 99 fL (ref 80.0–100.0)
NRBC: 0 % (ref 0.0–0.2)
PLATELETS: 174 10*3/uL (ref 150–400)
RBC: 3.93 MIL/uL (ref 3.87–5.11)
RDW: 13.1 % (ref 11.5–15.5)
WBC: 6.3 10*3/uL (ref 4.0–10.5)

## 2018-07-12 LAB — MRSA PCR SCREENING: MRSA by PCR: NEGATIVE

## 2018-07-12 LAB — TROPONIN I
TROPONIN I: 0.03 ng/mL — AB (ref <0.03)
TROPONIN I: 0.03 ng/mL — AB (ref <0.03)
Troponin I: 0.03 ng/mL (ref <0.03)

## 2018-07-12 LAB — TSH: TSH: 0.423 u[IU]/mL (ref 0.350–4.500)

## 2018-07-12 LAB — INFLUENZA PANEL BY PCR (TYPE A & B)
INFLAPCR: NEGATIVE
Influenza B By PCR: POSITIVE — AB

## 2018-07-12 LAB — MAGNESIUM: MAGNESIUM: 2.1 mg/dL (ref 1.7–2.4)

## 2018-07-12 MED ORDER — LORAZEPAM 2 MG/ML IJ SOLN: 1.0000 mg | Freq: Once | INTRAMUSCULAR | Status: DC

## 2018-07-12 MED ORDER — IPRATROPIUM-ALBUTEROL 0.5-2.5 (3) MG/3ML IN SOLN
3.0000 mL | Freq: Three times a day (TID) | RESPIRATORY_TRACT | Status: DC
  Administered 2018-07-13: 3 mL via RESPIRATORY_TRACT
  Filled 2018-07-12: qty 3

## 2018-07-12 MED ORDER — QUETIAPINE FUMARATE 100 MG PO TABS
100.0000 mg | ORAL_TABLET | Freq: Every day | ORAL | Status: DC
  Administered 2018-07-12 – 2018-07-13 (×3): 100 mg via ORAL
  Filled 2018-07-12 (×2): qty 1

## 2018-07-12 MED ORDER — SERTRALINE HCL 25 MG PO TABS
25.0000 mg | ORAL_TABLET | Freq: Every day | ORAL | Status: DC
  Administered 2018-07-12 – 2018-07-13 (×3): 25 mg via ORAL
  Filled 2018-07-12 (×3): qty 1

## 2018-07-12 MED ORDER — FAMOTIDINE 20 MG PO TABS: 10.0000 mg | ORAL_TABLET | Freq: Two times a day (BID) | ORAL | Status: DC | PRN

## 2018-07-12 MED ORDER — LEVALBUTEROL HCL 0.63 MG/3ML IN NEBU: 0.6300 mg | INHALATION_SOLUTION | Freq: Four times a day (QID) | RESPIRATORY_TRACT | Status: DC | PRN

## 2018-07-12 MED ORDER — ENOXAPARIN SODIUM 40 MG/0.4ML ~~LOC~~ SOLN
40.0000 mg | Freq: Every day | SUBCUTANEOUS | Status: DC
  Administered 2018-07-12 – 2018-07-14 (×3): 40 mg via SUBCUTANEOUS
  Filled 2018-07-12 (×3): qty 0.4

## 2018-07-12 MED ORDER — PANTOPRAZOLE SODIUM 40 MG PO TBEC
40.0000 mg | DELAYED_RELEASE_TABLET | Freq: Two times a day (BID) | ORAL | Status: DC
  Administered 2018-07-12 – 2018-07-14 (×6): 40 mg via ORAL
  Filled 2018-07-12 (×6): qty 1

## 2018-07-12 MED ORDER — IPRATROPIUM-ALBUTEROL 0.5-2.5 (3) MG/3ML IN SOLN
3.0000 mL | Freq: Four times a day (QID) | RESPIRATORY_TRACT | Status: DC
  Administered 2018-07-12 (×4): 3 mL via RESPIRATORY_TRACT
  Filled 2018-07-12 (×4): qty 3

## 2018-07-12 MED ORDER — PRAMIPEXOLE DIHYDROCHLORIDE 0.25 MG PO TABS
0.5000 mg | ORAL_TABLET | Freq: Three times a day (TID) | ORAL | Status: DC
  Administered 2018-07-12 – 2018-07-13 (×3): 0.5 mg via ORAL
  Filled 2018-07-12 (×9): qty 2

## 2018-07-12 MED ORDER — FLUTICASONE FUROATE-VILANTEROL 200-25 MCG/INH IN AEPB
1.0000 | INHALATION_SPRAY | Freq: Every day | RESPIRATORY_TRACT | Status: DC
  Administered 2018-07-12 – 2018-07-13 (×2): 1 via RESPIRATORY_TRACT
  Filled 2018-07-12: qty 28

## 2018-07-12 MED ORDER — QUETIAPINE FUMARATE 25 MG PO TABS
50.0000 mg | ORAL_TABLET | Freq: Every day | ORAL | Status: DC
  Administered 2018-07-12 – 2018-07-14 (×3): 50 mg via ORAL
  Filled 2018-07-12 (×5): qty 2

## 2018-07-12 MED ORDER — POLYETHYLENE GLYCOL 3350 17 G PO PACK
17.0000 g | PACK | Freq: Every day | ORAL | Status: DC
  Administered 2018-07-13: 17 g via ORAL
  Filled 2018-07-12 (×3): qty 1

## 2018-07-12 MED ORDER — ACETAMINOPHEN 325 MG PO TABS
650.0000 mg | ORAL_TABLET | Freq: Four times a day (QID) | ORAL | Status: DC | PRN
  Administered 2018-07-12: 650 mg via ORAL
  Filled 2018-07-12: qty 2

## 2018-07-12 MED ORDER — DOXYCYCLINE HYCLATE 100 MG PO TABS
100.0000 mg | ORAL_TABLET | Freq: Two times a day (BID) | ORAL | Status: DC
  Administered 2018-07-12 – 2018-07-14 (×6): 100 mg via ORAL
  Filled 2018-07-12 (×6): qty 1

## 2018-07-12 MED ORDER — ASPIRIN EC 81 MG PO TBEC
81.0000 mg | DELAYED_RELEASE_TABLET | Freq: Every day | ORAL | Status: DC
  Administered 2018-07-12 – 2018-07-14 (×3): 81 mg via ORAL
  Filled 2018-07-12 (×3): qty 1

## 2018-07-12 MED ORDER — GUAIFENESIN ER 600 MG PO TB12
600.0000 mg | ORAL_TABLET | Freq: Two times a day (BID) | ORAL | Status: DC | PRN
  Administered 2018-07-12: 600 mg via ORAL
  Filled 2018-07-12: qty 1

## 2018-07-12 MED ORDER — PRAMIPEXOLE DIHYDROCHLORIDE 1 MG PO TABS
1.0000 mg | ORAL_TABLET | Freq: Every day | ORAL | Status: DC
  Administered 2018-07-12 – 2018-07-13 (×3): 1 mg via ORAL
  Filled 2018-07-12 (×3): qty 1

## 2018-07-12 MED ORDER — ISOSORBIDE MONONITRATE ER 30 MG PO TB24
30.0000 mg | ORAL_TABLET | Freq: Every day | ORAL | Status: DC
  Administered 2018-07-12 – 2018-07-14 (×3): 30 mg via ORAL
  Filled 2018-07-12 (×3): qty 1

## 2018-07-12 MED ORDER — PREDNISONE 20 MG PO TABS
40.0000 mg | ORAL_TABLET | Freq: Every day | ORAL | Status: DC
  Administered 2018-07-12 – 2018-07-14 (×3): 40 mg via ORAL
  Filled 2018-07-12 (×3): qty 2

## 2018-07-12 MED ORDER — LORAZEPAM 2 MG/ML IJ SOLN
1.0000 mg | Freq: Once | INTRAMUSCULAR | Status: DC
  Filled 2018-07-12: qty 1

## 2018-07-12 MED ORDER — SENNOSIDES-DOCUSATE SODIUM 8.6-50 MG PO TABS
1.0000 | ORAL_TABLET | Freq: Two times a day (BID) | ORAL | Status: DC
  Administered 2018-07-12 – 2018-07-14 (×6): 1 via ORAL
  Filled 2018-07-12 (×6): qty 1

## 2018-07-12 MED ORDER — TRAMADOL HCL 50 MG PO TABS: 50.0000 mg | ORAL_TABLET | Freq: Four times a day (QID) | ORAL | Status: DC | PRN

## 2018-07-12 MED ORDER — ACETAMINOPHEN 650 MG RE SUPP: 650.0000 mg | Freq: Four times a day (QID) | RECTAL | Status: DC | PRN

## 2018-07-12 MED ORDER — HYDROCODONE-ACETAMINOPHEN 5-325 MG PO TABS: 1.0000 | ORAL_TABLET | ORAL | Status: DC | PRN

## 2018-07-12 MED ORDER — ALPRAZOLAM 0.25 MG PO TABS
0.2500 mg | ORAL_TABLET | Freq: Two times a day (BID) | ORAL | Status: DC
  Administered 2018-07-12 – 2018-07-14 (×6): 0.25 mg via ORAL
  Filled 2018-07-12 (×6): qty 1

## 2018-07-12 MED ORDER — FENTANYL 25 MCG/HR TD PT72
25.0000 ug | MEDICATED_PATCH | TRANSDERMAL | Status: DC
  Administered 2018-07-12: 25 ug via TRANSDERMAL
  Filled 2018-07-12 (×2): qty 1

## 2018-07-12 MED ORDER — LOSARTAN POTASSIUM 50 MG PO TABS
50.0000 mg | ORAL_TABLET | Freq: Every day | ORAL | Status: DC
  Administered 2018-07-12 – 2018-07-14 (×3): 50 mg via ORAL
  Filled 2018-07-12 (×3): qty 1

## 2018-07-12 MED ORDER — OSELTAMIVIR PHOSPHATE 30 MG PO CAPS
30.0000 mg | ORAL_CAPSULE | Freq: Every day | ORAL | Status: DC
  Administered 2018-07-12 – 2018-07-14 (×3): 30 mg via ORAL
  Filled 2018-07-12 (×3): qty 1

## 2018-07-12 NOTE — Consult Note (Addendum)
Cardiology Consultation:   Patient ID: [name] MRN: [id number]; DOB: 1929-06-02  Admit date: 07/11/2018 Date of Consult: 07/12/2018  Primary Care Provider: Marden NobleGates, Robert, MD Primary Cardiologist: No primary care provider on file.  Primary Electrophysiologist:  None    Patient Profile:   [name] is a 84 y.o. female with a hx of advanced dementia, hypertension, COPD on home oxygen, chronic diastolic and diastolic CHF who is being seen today for the evaluation of elevated troponin at the request of Dr. Caleb PoppNettey.  History of Present Illness:   [name] presented to [facility] on 07/11/2018 with complaints of shortness of breath, fever and nonproductive cough.  She was found to be positive for influenza B.  She has advanced dementia and is unable to provide much of the history.  In the ED she denied any chest pain.  Troponins have been mildly positive at 0.05 and 0.03, and a flat pattern.  She has known LV dysfunction with EF 25% to 30% with Diffuse hypokinesis and akinesis of the anteroseptal myocardium by echo in January 2019.  She had a cardiac catheterization done by Dr. Katrinka BlazingSmith in 2014 that showed normal coronary arteries and mildly reduced LV systolic function with EF 40%.  She has been treated with an ARB, Imdur.  He is not on a beta-blocker apparently due to WPW.  [name] lives in a skilled nursing facility due to advanced dementia.  She is a DNR.  Upon my assessment [name] is pleasant and talkative but oriented only to self.  She was unaware that she is in the hospital or that she tested positive for the flu.  It is difficult to discern whether she has had chest pain.  She complains mostly of congestion in her chest.  She is not able to provide any other useful information.   Past Medical History:  Diagnosis Date  . Abnormal cardiovascular stress test 01/17/2013   Decreased LV function with mild distal anterior perfusion abnormality   . Acute combined  systolic and diastolic heart failure (HCC) 01/17/2013  . Chronic combined systolic and diastolic CHF (congestive heart failure) (HCC) 08/17/2017  . COPD (chronic obstructive pulmonary disease) (HCC)   . Hypertension   . Restless leg syndrome   . Right lower lobe pneumonia (HCC) 08/17/2017  . Sepsis (HCC) 08/17/2017  . Tobacco use disorder   . WPW (Wolff-Parkinson-White syndrome)     Past Surgical History:  Procedure Laterality Date  . APPENDECTOMY    . BACK SURGERY     MULTIPLE  . HEMANGIOMA EXCISION    . HIP ARTHROPLASTY Left 09/20/2017   Procedure: ARTHROPLASTY BIPOLAR HIP (HEMIARTHROPLASTY) LEFT;  Surgeon: Ollen GrossAluisio, Frank, MD;  Location: [facility];  Service: Orthopedics;  Laterality: Left;  . KIDNEY SURGERY     RT KIDNEY TACK  . NECK SURGERY    . TONSILLECTOMY       Home Medications:  Prior to Admission medications   Medication Sig Start Date End Date Taking? Authorizing Provider  acetaminophen (TYLENOL) 500 MG tablet Take 500 mg by mouth every 6 (six) hours as needed for moderate pain.   Yes [provider]  ACIDOPHILUS LACTOBACILLUS PO Take 1 tablet by mouth every Monday, Wednesday, and Friday.   Yes [provider]  albuterol (PROVENTIL HFA;VENTOLIN HFA) 108 (90 Base) MCG/ACT inhaler Inhale 2 puffs into the lungs every 6 (six) hours as needed for wheezing or shortness of breath.   Yes [provider]  ALPRAZolam Prudy Feeler(XANAX) 0.25 MG  tablet Take 0.25 mg by mouth 2 (two) times daily.   Yes [provider]  aspirin EC 81 MG tablet Take 81 mg by mouth daily.   Yes [provider]  B Complex Vitamins (B COMPLEX-B12) TABS Take 1 tablet by mouth daily.   Yes [provider]  cholecalciferol (VITAMIN D3) 25 MCG (1000 UT) tablet Take 1,000 Units by mouth daily.   Yes [provider]  famotidine (PEPCID) 10 MG tablet Take 10 mg by mouth every 12 (twelve) hours as needed for heartburn or indigestion.   Yes [provider]    fentaNYL (DURAGESIC - DOSED MCG/HR) 25 MCG/HR patch Place 1 patch (25 mcg total) onto the skin every 3 (three) days. 09/23/17  Yes Albertine Grates, MD  ferrous sulfate 325 (65 FE) MG tablet Take 325 mg by mouth every Monday, Wednesday, and Friday.    Yes [provider]  fluticasone furoate-vilanterol (BREO ELLIPTA) 200-25 MCG/INH AEPB Inhale 1 puff into the lungs at bedtime.   Yes [provider]  guaiFENesin (MUCINEX) 600 MG 12 hr tablet Take 600 mg by mouth every 12 (twelve) hours as needed for cough or to loosen phlegm.   Yes [provider]  ipratropium-albuterol (DUONEB) 0.5-2.5 (3) MG/3ML SOLN Take 3 mLs by nebulization every 6 (six) hours as needed (shortness of breath).    Yes [provider]  isosorbide mononitrate (IMDUR) 30 MG 24 hr tablet Take 30 mg by mouth daily.   Yes [provider]  loratadine (CLARITIN) 10 MG tablet Take 10 mg by mouth daily.   Yes [provider]  losartan (COZAAR) 50 MG tablet Take 50 mg by mouth daily.   Yes [provider]  magic mouthwash w/lidocaine SOLN Take 15 mLs by mouth 4 (four) times daily as needed for mouth pain.   Yes [provider]  Magnesium 250 MG TABS Take 1 tablet by mouth daily.   Yes [provider]  methocarbamol (ROBAXIN) 500 MG tablet Take 0.5 tablets (250 mg total) by mouth at bedtime as needed. 09/22/17  Yes Perkins, Alexzandrew L, PA-C  nitroGLYCERIN (NITROSTAT) 0.4 MG SL tablet Place 1 tablet (0.4 mg total) under the tongue every 5 (five) minutes x 3 doses as needed for chest pain. 12/22/12  Yes Corky Crafts, MD  ondansetron (ZOFRAN) 4 MG tablet Take 4 mg by mouth every 8 (eight) hours as needed for nausea or vomiting.   Yes [provider]  OXYGEN Inhale 1 L into the lungs as needed (oxygen saturation 87% or below).    Yes [provider]  pantoprazole (PROTONIX) 40 MG tablet Take 40 mg by mouth 2 (two) times daily.   Yes [provider]  polyethylene glycol (MIRALAX / GLYCOLAX) packet Take 17 g by mouth daily. 09/22/17  Yes Albertine Grates, MD  pramipexole (MIRAPEX) 0.5 MG tablet Take 0.5 mg by mouth daily as needed.   Yes [provider]  pramipexole (MIRAPEX) 1 MG tablet Take 1 mg by mouth at bedtime.    Yes [provider]  QUEtiapine (SEROQUEL) 100 MG tablet Take 1 tablet (100 mg total) by mouth at bedtime. Only gives 100 mg four times a week Patient taking differently: Take 50-100 mg by mouth See admin instructions. Take 50 mg by mouth in the morning and 100 mg by mouth at bedtime 08/21/17  Yes Alison Murray, MD  sennosides-docusate sodium (SENOKOT-S) 8.6-50 MG tablet Take 1 tablet by mouth 2 (two) times daily.  Yes [provider]  sertraline (ZOLOFT) 25 MG tablet Take 1 tablet (25 mg total) by mouth at bedtime. 08/21/17  Yes Alison Murray, MD  traMADol (ULTRAM) 50 MG tablet Take 50 mg by mouth every 6 (six) hours as needed for moderate pain.    Yes [provider]  tamsulosin (FLOMAX) 0.4 MG CAPS capsule Take 1 capsule (0.4 mg total) by mouth daily. Patient not taking: Reported on 07/11/2018 09/23/17   Albertine Grates, MD    Inpatient Medications: Scheduled Meds: . ALPRAZolam  0.25 mg Oral BID  . aspirin EC  81 mg Oral Daily  . doxycycline  100 mg Oral Q12H  . enoxaparin (LOVENOX) injection  40 mg Subcutaneous Daily  . fentaNYL  25 mcg Transdermal Q72H  . fluticasone furoate-vilanterol  1 puff Inhalation QHS  . ipratropium-albuterol  3 mL Nebulization Q6H  . isosorbide mononitrate  30 mg Oral Daily  . losartan  50 mg Oral Daily  . pantoprazole  40 mg Oral BID  . polyethylene glycol  17 g Oral Daily  . pramipexole  0.5 mg Oral TID  . pramipexole  1 mg Oral QHS  . predniSONE  40 mg Oral Q breakfast  . QUEtiapine  100 mg Oral QHS  . QUEtiapine  50 mg Oral Daily  . senna-docusate  1 tablet Oral BID  . sertraline  25 mg Oral QHS   Continuous Infusions:  PRN  Meds: acetaminophen **OR** acetaminophen, famotidine, guaiFENesin, HYDROcodone-acetaminophen, levalbuterol, traMADol  Allergies:    Allergies  Allergen Reactions  . Amoxicillin Anaphylaxis and Rash  . Ciprofloxacin Anaphylaxis and Other (See Comments)    Thrush  . Lorabid [Loracarbef] Anaphylaxis and Rash  . Avelox [Moxifloxacin] Other (See Comments)    Tendon and joint pain  . Erythromycin Other (See Comments)    angioedema  . Inderal [Propranolol] Other (See Comments)    Vasculitis  . Chantix [Varenicline] Nausea Only    Social History:   Social History   Socioeconomic History  . Marital status: Married    Spouse name: Not on file  . Number of children: Not on file  . Years of education: Not on file  . Highest education level: Not on file  Occupational History  . Not on file  Social Needs  . Financial resource strain: Not on file  . Food insecurity:    Worry: Not on file    Inability: Not on file  . Transportation needs:    Medical: Not on file    Non-medical: Not on file  Tobacco Use  . Smoking status: Former Games developer  . Smokeless tobacco: Never Used  Substance and Sexual Activity  . Alcohol use: No  . Drug use: No  . Sexual activity: Never  Lifestyle  . Physical activity:    Days per week: Not on file    Minutes per session: Not on file  . Stress: Not on file  Relationships  . Social connections:    Talks on phone: Not on file    Gets together: Not on file    Attends religious service: Not on file    Active member of club or organization: Not on file    Attends meetings of clubs or organizations: Not on file    Relationship status: Not on file  . Intimate partner violence:    Fear of current or ex partner: Not on file    Emotionally abused: Not on file    Physically abused: Not on file  Forced sexual activity: Not on file  Other Topics Concern  . Not on file  Social History Narrative   Admitted to [facility] 08/23/17   Married   Current smoker    Alcohol none   DNR    Family History:    Family History  Problem Relation Age of Onset  . Stomach cancer Mother   . CAD Father   . Diabetes Father   . Hypertension Father   . Hypertension Daughter   . Stroke Neg Hx      ROS:  Please see the history of present illness.  Full review of systems is not possible due to her dementia and being a poor historian.  Physical Exam/Data:   Vitals:   07/12/18 0018 07/12/18 0028 07/12/18 0442 07/12/18 0733  BP: 119/64  (!) 98/51   Pulse: 85  83   Resp: 20  20   Temp: 98.7 F (37.1 C)  97.7 F (36.5 C)   TempSrc: Oral     SpO2: 96% 92% (!) 78% 97%   No intake or output data in the 24 hours ending 07/12/18 0745 There were no vitals filed for this visit. There is no height or weight on file to calculate BMI.  General: Well-appearing elderly female except for congested cough, in no acute distress HEENT: normal Lymph: no adenopathy Neck: + JVD Endocrine:  No thryomegaly Vascular: No carotid bruits; FA pulses 2+ bilaterally without bruits  Cardiac:  normal S1, S2; RRR; no murmur  Lungs: Coarse rhonchi and expiratory wheezes Abd: soft, nontender, no hepatomegaly  Ext: no edema Musculoskeletal:  No deformities, BUE and BLE strength normal and equal Skin: warm and dry  Neuro:  CNs 2-12 intact, no focal abnormalities noted Psych:  Normal affect   EKG:  The EKG was personally reviewed and demonstrates:  Normal sinus rhythm, 68 bpm, Minimal voltage criteria for LVH. Prolonged QTC at 512 Telemetry:  Telemetry was personally reviewed and demonstrates: Sinus rhythm in the 60s-70s with no ectopy  Relevant CV Studies:  Echo 09/20/17 Study Conclusions - Left ventricle: The cavity size was normal. Wall thickness was   increased in a pattern of mild LVH. Systolic function was   severely reduced. The estimated ejection fraction was in the   range of 25% to 30%. Diffuse hypokinesis. There is akinesis of   the anteroseptal myocardium.  Doppler parameters are consistent   with high ventricular filling pressure. - Aortic valve: Trileaflet; mildly thickened, mildly calcified   leaflets. - Mitral valve: There was trivial regurgitation. - Pulmonary arteries: Systolic pressure was mildly increased. PA   peak pressure: 33 mm Hg (S).   Cardiac catheterization 01/17/2013 ANGIOGRAPHIC DATA:    The left main coronary artery is dilated and widely patent. The left anterior descending artery is widely patent. The distal vessel reaches the left ventricular apex. Gives origin to one large diagonal branch. Minimal luminal irregularities are noted.  The left circumflex artery is widely patent. No high-grade obstruction is seen. 2 obtuse marginal branches arise from the circumflex. No significant obstruction The right coronary artery is dominant and widely patent. The PDA reaches the left ventricular apex. LEFT VENTRICULOGRAM:  Left ventricular angiogram was done in the 30 RAO projection and revealed global hypokinesis with an estimated ejection fraction of 40%.  LVEDP was 20 mmHg. IMPRESSIONS:   1. Mild to moderate reduction in ejection fraction with elevated left ventricular end-diastolic pressure consistent with chronic combined systolic and diastolic heart failure. Findings are consistent with nonischemic cardiomyopathy  2. Widely patent coronary arteries. RECOMMENDATION:  Aggressive heart failure therapy to include ACE/R. therapy and beta blocker therapy as tolerated 2 preserve  left ventricular function..   Laboratory Data:  Chemistry Recent Labs  Lab 07/11/18 1907 07/12/18 0520  NA 139 141  K 4.4 4.7  CL 104 104  CO2 25 24  GLUCOSE 109* 193*  BUN 29* 27*  CREATININE 1.20* 1.18*  CALCIUM 8.3* 8.7*  GFRNONAA 39* 40*  GFRAA 46* 47*  ANIONGAP 10 13    Recent Labs  Lab 07/11/18 1907 07/12/18 0520  PROT 6.7 6.7  ALBUMIN 3.5 3.7  AST 39 35  ALT 19 20  ALKPHOS 64 57  BILITOT 0.8 0.6   Hematology Recent Labs  Lab  07/11/18 1907 07/12/18 0520  WBC 6.5 6.3  RBC 3.73* 3.93  HGB 11.4* 11.8*  HCT 36.6 38.9  MCV 98.1 99.0  MCH 30.6 30.0  MCHC 31.1 30.3  RDW 13.2 13.1  PLT 180 174   Cardiac Enzymes Recent Labs  Lab 07/11/18 1907 07/11/18 2310 07/12/18 0520  TROPONINI 0.05* 0.05* 0.03*   No results for input(s): TROPIPOC in the last 168 hours.  BNP Recent Labs  Lab 07/11/18 1907  BNP 180.2*    DDimer No results for input(s): DDIMER in the last 168 hours.  Radiology/Studies:  Dg Chest Port 1 View  Result Date: 07/11/2018 CLINICAL DATA:  84 year old female with productive cough and shortness of breath EXAM: PORTABLE CHEST 1 VIEW COMPARISON:  Prior chest x-ray 09/19/2017 FINDINGS: Stable cardiomegaly. Atherosclerotic calcifications again noted in the transverse aorta. No focal airspace consolidation to suggest pneumonia. No pulmonary edema, pleural effusion or pneumothorax. Stable chronic bronchitic changes. No acute osseous abnormality. Epidural spinal stimulator. Incompletely imaged anterior cervical stabilization hardware. Right humeral head soft tissue anchor. No acute osseous abnormality. IMPRESSION: Stable chest x-ray without evidence of acute cardiopulmonary process. Cardiomegaly and Aortic Atherosclerosis (ICD10-170.0). Electronically Signed   By: Malachy Moan M.D.   On: 07/11/2018 20:44    Assessment and Plan:   1.  Elevated troponin -Patient admitted with influenza B, shortness of breath, fever and cough. -Troponins have been mildly positive at 0.05 and 0.03, and a flat pattern consistent with demand ischemia in the setting of acute illness. -The patient had widely patent coronary arteries by cardiac cath in 2014 after having an abnormal stress test and reduced EF. -Patient denies chest pain although she has advanced dimentia and is a poor historian.  She appears quite comfortable. -Considering the patient's advanced dementia, comorbidities and acute illness with only mildly  elevated troponins not consistent with ACS, would not recommend any ischemic evaluation at this time. -Dr. Delton See to see pt with further recommendations.   2.  Chronic systolic and diastolic heart failure -EF 25% to 30% with Diffuse hypokinesis and akinesis of the anteroseptal myocardium by echo in January 2019 -BNP is only mildly elevated at 180.2 -Repeat echocardiogram has been ordered -Patient appears to be euvolemic  3.  Prolonged QTC -QTC 511 with history of prolonged QTC with no apparent arrhythmias -Avoid QT prolonging medications -No ectopy on telemetry  4.  Acute encephalopathy -In setting of acute illness and baseline dementia -Primary team is holding contributing medications -Today the patient is alert and talkative, pleasant.  She is only oriented to self, unable to tell me that she is in the hospital or why she is here.  5.  Hypertension -Blood pressure is stable on her home medications -Continue current therapy  [facility] will sign  off.   Medication Recommendations: Continue current medications, avoiding QT prolonging drugs Other recommendations (labs, testing, etc): None Follow up as an outpatient: Continue to follow-up with her PCP.  Please call us if there are any changes requiring further evaluation.  For questions or updates, please contact [facility] Please consult [email or web address] for contact info under   Signed, Berton Bon, NP  07/12/2018 7:45 AM   The patient was seen, examined and discussed with Berton Bon, NP-C and I agree with the above.   84 y.o. female with a hx of advanced dementia, hypertension, COPD on home oxygen, chronic diastolic and diastolic CHF who is being seen today for the evaluation of elevated troponin.  The patient was admitted yesterday with fever, cough and shortness of breath and tested positive for influenza B. Troponins have been mildly positive at 0.05 and 0.03, and a flat pattern.  EKG shows normal sinus rhythm  with LVH otherwise normal and unchanged from prior.  She has known LV dysfunction with EF 25% to 30% in January 2019.  She had a cardiac catheterization done by Dr. [name] in 2014 that showed normal coronary arteries and mildly reduced LV systolic function with EF 40%.  She has been treated with an ARB, Imdur.  He is not on a beta-blocker apparently due to WPW. [name] lives in a skilled nursing facility due to advanced dementia.  She is a DNR. Physical exam reveals S1-S2, mild systolic murmur, clear lungs, no lower extremity edema. I have reviewed her echocardiogram, her LVEF appears to be improved now 30 to 35%.  We will not recommend ischemic work-up, as her minimally elevated troponin with flat trend is secondary to demand ischemia in the settings of acute illness -flu, acute kidney failure.  In addition she would be a poor cath candidate given her advanced dementia.  Patient does not show any signs of fluid overload.  [facility] will sign off.   Medication Recommendations:  As above, continue same medical management for stable chronic combined systolic and diastolic CHF Other recommendations (labs, testing, etc): No ischemia testing is indicated Follow up as an outpatient: As needed.  Tobias Alexander, MD 07/12/2018

## 2018-07-12 NOTE — Progress Notes (Signed)
  Echocardiogram 2D Echocardiogram has been performed.  Hollace Michelli L Androw 07/12/2018, 12:33 PM

## 2018-07-12 NOTE — Evaluation (Signed)
Occupational Therapy Evaluation Patient Details Name: Deborah MoronBetty B [name] MRN: [id number] DOB: [birth year] Today's Date: 07/12/2018    History of Present Illness 84 y o female admitted with cough and SOB.  PMH: Deborah PentonWolf Parkinson-White syndrome   Clinical Impression   Pt was admitted for the above. She is from memory care and has assistance for adls as needed. She was independent ambulating and using toilet. Will focus on toileting during OT to return to PLOF.    Follow Up Recommendations  (memory care vs SNF)    Equipment Recommendations  None recommended by OT    Recommendations for Other Services       Precautions / Restrictions Precautions Precautions: Fall Restrictions Weight Bearing Restrictions: No      Mobility Bed Mobility Overal bed mobility: Needs Assistance Bed Mobility: Supine to Sit     Supine to sit: Min guard     General bed mobility comments: extra time; HOB raised  Transfers Overall transfer level: Needs assistance Equipment used: None Transfers: Sit to/from Stand Sit to Stand: Min assist         General transfer comment: steadying assistance    Balance                                           ADL either performed or assessed with clinical judgement   ADL Overall ADL's : Needs assistance/impaired Eating/Feeding: Minimal assistance   Grooming: Wash/dry hands;Min guard;Standing   Upper Body Bathing: Moderate assistance   Lower Body Bathing: Moderate assistance   Upper Body Dressing : Minimal assistance   Lower Body Dressing: Total assistance   Toilet Transfer: Minimal assistance;Ambulation;Comfort height toilet   Toileting- Clothing Manipulation and Hygiene: Minimal assistance;Maximal assistance;Sit to/from stand         General ADL Comments: ambulated to bathroom, performed hygiene, toileting and grooming. Was working on breakfast.  Pt needed cues and food broken up     Vision         Perception     Praxis       Pertinent Vitals/Pain Pain Assessment: Faces Faces Pain Scale: Hurts even more Pain Location: back Pain Descriptors / Indicators: Grimacing Pain Intervention(s): Limited activity within patient's tolerance;Monitored during session;Repositioned     Hand Dominance     Extremity/Trunk Assessment Upper Extremity Assessment Upper Extremity Assessment: Difficult to assess due to impaired cognition           Communication Communication Communication: No difficulties   Cognition Arousal/Alertness: Awake/alert Behavior During Therapy: WFL for tasks assessed/performed Overall Cognitive Status: No family/caregiver present to determine baseline cognitive functioning                                     General Comments       Exercises     Shoulder Instructions      Home Living Family/patient expects to be discharged to:: (memory care)                                 Additional Comments: from memory care      Prior Functioning/Environment Level of Independence: Needs assistance  Gait / Transfers Assistance Needed: I ADL's / Homemaking Assistance Needed: assist as needed  OT Problem List: Decreased strength;Decreased activity tolerance;Pain;Decreased cognition;Decreased safety awareness;Impaired balance (sitting and/or standing)      OT Treatment/Interventions: Self-care/ADL training;Balance training;Patient/family education;Cognitive remediation/compensation;Therapeutic activities    OT Goals(Current goals can be found in the care plan section) Acute Rehab OT Goals Patient Stated Goal: none stated OT Goal Formulation: With family Time For Goal Achievement: 07/26/18 Potential to Achieve Goals: Good ADL Goals Pt Will Perform Grooming: (P) with supervision;standing(multimodal cues) Pt Will Transfer to Toilet: (P) with supervision;ambulating;regular height toilet Pt Will Perform Toileting - Clothing Manipulation and hygiene:  (P) with supervision;with set-up;sit to/from stand(multimodal cues)  OT Frequency: Min 2X/week   Barriers to D/C:            Co-evaluation              AM-PAC PT "6 Clicks" Daily Activity     Outcome Measure Help from another person eating meals?: A Little Help from another person taking care of personal grooming?: A Little Help from another person toileting, which includes using toliet, bedpan, or urinal?: A Lot Help from another person bathing (including washing, rinsing, drying)?: A Lot Help from another person to put on and taking off regular upper body clothing?: A Lot Help from another person to put on and taking off regular lower body clothing?: Total 6 Click Score: 13   End of Session    Activity Tolerance: Patient tolerated treatment well Patient left: in chair;with call bell/phone within reach;with chair alarm set;with family/visitor present  OT Visit Diagnosis: Unsteadiness on feet (R26.81)                Time: 8119-1478 OT Time Calculation (min): 28 min Charges:  OT General Charges $OT Visit: 1 Visit OT Evaluation $OT Eval Low Complexity: 1 Low OT Treatments $Self Care/Home Management : 8-22 mins  [name], OTR/L Acute Rehabilitation Services [phone] [facility] pager [phone] office 07/12/2018  [name] 07/12/2018, 9:43 AM

## 2018-07-12 NOTE — Progress Notes (Signed)
PROGRESS NOTE  [name] AVW:[id number] DOB: 09-23-1928 DOA: 07/11/2018 PCP: Marden NobleGates, Robert, MD  HPI/Brief Narrative  [name] is a 84 y.o. year old female with medical history significant for Dementia , COPD on PRN 1-2 L of O2, Systolic CHF  who presented on 07/11/2018 with SOB and nonproductive cough and malaise for several days followed by low grade fever at her facility and was found to have COPD exacerbation secondary to influenza B infection.  Subjective No acute complaints. Daughter at bedside  Assessment/Plan:  COPD exacerbation secondary to influenza B infection, improving.  Presented with cough, shortness of breath but not requiring any supplemental oxygen. Flu +, no pna on cxr  We will continue scheduled duo nebs, transition from solumedrol to prednisone, doxycycline for inflammatory effects, mucinex, and add Tamiflu for supportive care.  Flutter valve, incentive spirometry as needed.  Monitor over the next 24 hours anticipate discharge in that time.  Slight troponin elevation, suspect demand ischemia with flat troponin trend and nonischemic EKG and asymptomatic.  Likely due to COPD exacerbation/flu.  Chronic combined CHF with reduced ejection fraction and diastolic dysfunction.  Will repeat TTE here per cardiology recommendations.  Euvolemic on exam volume elevation of BNP at 180.  Daily weights, monitor closely.  Prolonged QTC.  History of prolonged QTC with no apparent arrhythmias per cardiology.  Will avoid QT prolonging medications.  No ectopy on telemetry.  Acute encephalopathy, likely delirium in the setting of acute illness and patient with baseline dementia.  Improved.  Alert and talkative on exam today very pleasant.  Oriented to self.  We will continue to monitor, delirium precautions.  CAD, stable. No chest pain. Home aspirin and imdur  Hypertension, stable.  Continue home losartan  Dementia, stable. Home seroquel.   Depression, stable. Home  zoloft  Chronic back pain, Stable. Home fentanyl patch and norco PRN q4H and tramadol every 6 H PRN moderate pain  Code Status: DNR   Family Communication: Daughter at bedside   Disposition Plan: monitor over 24 hours will add tamuflu    Consultants:   Treatment Team:   Lbcardiology, Rounding, MD    Procedures:  11/14, TTE   Antimicrobials: Anti-infectives (From admission, onward)   Start     Dose/Rate Route Frequency Ordered Stop   07/12/18 1000  oseltamivir (TAMIFLU) capsule 30 mg     30 mg Oral Daily 07/12/18 0944 07/17/18 0959   07/12/18 0015  doxycycline (VIBRA-TABS) tablet 100 mg     100 mg Oral Every 12 hours 07/12/18 0010 07/16/18 2159         Cultures:  Blood cultures, 11/13  Telemetry:yes  DVT prophylaxis: lovenox   Objective: Vitals:   07/12/18 0028 07/12/18 0442 07/12/18 0733 07/12/18 1024  BP:  (!) 98/51  122/71  Pulse:  83  78  Resp:  20    Temp:  97.7 F (36.5 C)  98.5 F (36.9 C)  TempSrc:    Oral  SpO2: 92% (!) 78% 97% 97%    Intake/Output Summary (Last 24 hours) at 07/12/2018 1419 Last data filed at 07/12/2018 1047 Gross per 24 hour  Intake 120 ml  Output -  Net 120 ml   There were no vitals filed for this visit.  Exam:  Constitutional:normal appearing elderly female Eyes: EOMI, anicteric, normal conjunctivae ENMT: Oropharynx with moist mucous membranes Cardiovascular: RRR no MRGs, with no peripheral edema Respiratory: Normal respiratory effort on room air, upper airway congestion, no wheezing   Abdomen: Soft,non-tender, with no  HSM Skin: No rash ulcers, or lesions. Without skin tenting  Neurologic: Grossly no focal neuro deficit. Psychiatric:Appropriate affect, and mood. Mental status alert to self, pleasantly demented otherwise Data Reviewed: CBC: Recent Labs  Lab 07/11/18 1907 07/12/18 0520  WBC 6.5 6.3  NEUTROABS 5.4  --   HGB 11.4* 11.8*  HCT 36.6 38.9  MCV 98.1 99.0  PLT 180 174   Basic Metabolic  Panel: Recent Labs  Lab 07/11/18 1907 07/12/18 0520  NA 139 141  K 4.4 4.7  CL 104 104  CO2 25 24  GLUCOSE 109* 193*  BUN 29* 27*  CREATININE 1.20* 1.18*  CALCIUM 8.3* 8.7*  MG  --  2.1  PHOS  --  3.9   GFR: CrCl cannot be calculated (Unknown ideal weight.). Liver Function Tests: Recent Labs  Lab 07/11/18 1907 07/12/18 0520  AST 39 35  ALT 19 20  ALKPHOS 64 57  BILITOT 0.8 0.6  PROT 6.7 6.7  ALBUMIN 3.5 3.7   No results for input(s): LIPASE, AMYLASE in the last 168 hours. No results for input(s): AMMONIA in the last 168 hours. Coagulation Profile: No results for input(s): INR, PROTIME in the last 168 hours. Cardiac Enzymes: Recent Labs  Lab 07/11/18 1907 07/11/18 2310 07/12/18 0520 07/12/18 1119  TROPONINI 0.05* 0.05* 0.03* 0.03*   BNP (last 3 results) No results for input(s): PROBNP in the last 8760 hours. HbA1C: No results for input(s): HGBA1C in the last 72 hours. CBG: No results for input(s): GLUCAP in the last 168 hours. Lipid Profile: No results for input(s): CHOL, HDL, LDLCALC, TRIG, CHOLHDL, LDLDIRECT in the last 72 hours. Thyroid Function Tests: Recent Labs    07/12/18 0520  TSH 0.423   Anemia Panel: No results for input(s): VITAMINB12, FOLATE, FERRITIN, TIBC, IRON, RETICCTPCT in the last 72 hours. Urine analysis:    Component Value Date/Time   COLORURINE AMBER (A) 07/11/2018 2038   APPEARANCEUR HAZY (A) 07/11/2018 2038   LABSPEC 1.020 07/11/2018 2038   PHURINE 5.0 07/11/2018 2038   GLUCOSEU NEGATIVE 07/11/2018 2038   HGBUR NEGATIVE 07/11/2018 2038   BILIRUBINUR NEGATIVE 07/11/2018 2038   KETONESUR NEGATIVE 07/11/2018 2038   PROTEINUR NEGATIVE 07/11/2018 2038   UROBILINOGEN 0.2 04/25/2014 1827   NITRITE NEGATIVE 07/11/2018 2038   LEUKOCYTESUR NEGATIVE 07/11/2018 2038   Sepsis Labs: @LABRCNTIP (procalcitonin:4,lacticidven:4)  ) Recent Results (from the past 240 hour(s))  Respiratory Panel by PCR     Status: None   Collection  Time: 07/12/18  3:47 AM  Result Value Ref Range Status   Adenovirus NOT DETECTED NOT DETECTED Final   Coronavirus 229E NOT DETECTED NOT DETECTED Final   Coronavirus HKU1 NOT DETECTED NOT DETECTED Final   Coronavirus NL63 NOT DETECTED NOT DETECTED Final   Coronavirus OC43 NOT DETECTED NOT DETECTED Final   Metapneumovirus NOT DETECTED NOT DETECTED Final   Rhinovirus / Enterovirus NOT DETECTED NOT DETECTED Final   Influenza A NOT DETECTED NOT DETECTED Final   Influenza B NOT DETECTED NOT DETECTED Final   Parainfluenza Virus 1 NOT DETECTED NOT DETECTED Final   Parainfluenza Virus 2 NOT DETECTED NOT DETECTED Final   Parainfluenza Virus 3 NOT DETECTED NOT DETECTED Final   Parainfluenza Virus 4 NOT DETECTED NOT DETECTED Final   Respiratory Syncytial Virus NOT DETECTED NOT DETECTED Final   Bordetella pertussis NOT DETECTED NOT DETECTED Final   Chlamydophila pneumoniae NOT DETECTED NOT DETECTED Final   Mycoplasma pneumoniae NOT DETECTED NOT DETECTED Final    Comment: Performed at [facility]  Lab, [address] [place], [place] [address]  MRSA PCR Screening     Status: None   Collection Time: 07/12/18 11:20 AM  Result Value Ref Range Status   MRSA by PCR NEGATIVE NEGATIVE Final    Comment:        The GeneXpert MRSA Assay (FDA approved for NASAL specimens only), is one component of a comprehensive MRSA colonization surveillance program. It is not intended to diagnose MRSA infection nor to guide or monitor treatment for MRSA infections. Performed at [facility], [address], [place], [place] [address]       Studies: Dg Chest Port 1 View  Result Date: 07/11/2018 CLINICAL DATA:  84 year old female with productive cough and shortness of breath EXAM: PORTABLE CHEST 1 VIEW COMPARISON:  Prior chest x-ray 09/19/2017 FINDINGS: Stable cardiomegaly. Atherosclerotic calcifications again noted in the transverse aorta. No focal airspace consolidation to suggest  pneumonia. No pulmonary edema, pleural effusion or pneumothorax. Stable chronic bronchitic changes. No acute osseous abnormality. Epidural spinal stimulator. Incompletely imaged anterior cervical stabilization hardware. Right humeral head soft tissue anchor. No acute osseous abnormality. IMPRESSION: Stable chest x-ray without evidence of acute cardiopulmonary process. Cardiomegaly and Aortic Atherosclerosis (ICD10-170.0). Electronically Signed   By: Malachy Moan M.D.   On: 07/11/2018 20:44    Scheduled Meds: . ALPRAZolam  0.25 mg Oral BID  . aspirin EC  81 mg Oral Daily  . doxycycline  100 mg Oral Q12H  . enoxaparin (LOVENOX) injection  40 mg Subcutaneous Daily  . fentaNYL  25 mcg Transdermal Q72H  . fluticasone furoate-vilanterol  1 puff Inhalation QHS  . ipratropium-albuterol  3 mL Nebulization Q6H  . isosorbide mononitrate  30 mg Oral Daily  . losartan  50 mg Oral Daily  . oseltamivir  30 mg Oral Daily  . pantoprazole  40 mg Oral BID  . polyethylene glycol  17 g Oral Daily  . pramipexole  0.5 mg Oral TID  . pramipexole  1 mg Oral QHS  . predniSONE  40 mg Oral Q breakfast  . QUEtiapine  100 mg Oral QHS  . QUEtiapine  50 mg Oral Daily  . senna-docusate  1 tablet Oral BID  . sertraline  25 mg Oral QHS    Continuous Infusions:   LOS: 0 days     Laverna Peace, MD [facility] Pager [phone]  If 7PM-7AM, please contact night-coverage [email or web address] Password [email or web address] 07/12/2018, 2:19 PM

## 2018-07-12 NOTE — Evaluation (Signed)
Physical Therapy Evaluation Patient Details Name: [name] MRN: [id number] DOB: 12-27-1928 Today's Date: 07/12/2018   History of Present Illness  42 y o female admitted with cough and SOB.  PMH:  Wolff-Parkinson-White syndrome  Clinical Impression  Pt admitted with above diagnosis. Pt currently with functional limitations due to the deficits listed below (see PT Problem List).  Pt will benefit from skilled PT to increase their independence and safety with mobility to allow discharge to the venue listed below.  Pt assisted to bathroom prior to ambulating however had coughing spell and requested to sit prior to ambulating in hallway.  SpO2 85% on room air at the time so applied 1L O2 [place] and RN aware and into room end of session.  Pt may need SNF prior to return to memory care unit depending on assist available from ALF.  Pt does not typically wear oxygen however daughter reports pt sometimes requires oxygen short term during COPD flares.  Pt also does not use assistive device and will not likely remember to use.     Follow Up Recommendations SNF(if ALF unable to provide current level of care)    Equipment Recommendations  None recommended by PT    Recommendations for Other Services       Precautions / Restrictions Precautions Precautions: Fall Precaution Comments: monitor sats Restrictions Weight Bearing Restrictions: No      Mobility  Bed Mobility Overal bed mobility: Needs Assistance Bed Mobility: Supine to Sit     Supine to sit: Min guard     General bed mobility comments: in recliner on arrival  Transfers Overall transfer level: Needs assistance Equipment used: None Transfers: Sit to/from Stand Sit to Stand: Min assist         General transfer comment: assist to steady with rise, utilizes UE self assist  Ambulation/Gait Ambulation/Gait assistance: Min assist Gait Distance (Feet): 12 Feet(x2) Assistive device: None Gait Pattern/deviations: Step-through  pattern;Decreased stride length;Narrow base of support     General Gait Details: assist for steadying; pt ambulated to/from bathing, coughing spell upon exiting bathroom and pt assisted to recliner, SPO2 had dropped to 85% (RN notified and provided [place] and pt placed on 1L O2 with SpO2 97%)  Stairs            Wheelchair Mobility    Modified Rankin (Stroke Patients Only)       Balance Overall balance assessment: Mild deficits observed, not formally tested                                           Pertinent Vitals/Pain Pain Assessment: Faces Faces Pain Scale: Hurts little more Pain Location: not specified, but pain with coughing Pain Descriptors / Indicators: Grimacing Pain Intervention(s): Monitored during session;Repositioned    Home Living Family/patient expects to be discharged to:: Assisted living                 Additional Comments: from memory care    Prior Function Level of Independence: Needs assistance   Gait / Transfers Assistance Needed: independent, 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 - likes to keep her hands busy - folds napkins  ADL's / Homemaking Assistance Needed: assist as needed        Hand Dominance        Extremity/Trunk Assessment   Upper Extremity Assessment Upper Extremity Assessment: Difficult to assess due to impaired cognition  Lower Extremity Assessment Lower Extremity Assessment: Generalized weakness       Communication   Communication: No difficulties  Cognition Arousal/Alertness: Awake/alert Behavior During Therapy: WFL for tasks assessed/performed Overall Cognitive Status: History of cognitive impairments - at baseline                                        General Comments      Exercises     Assessment/Plan    PT Assessment Patient needs continued PT services  PT Problem List Decreased strength;Decreased mobility;Decreased balance;Decreased knowledge of use of  DME;Decreased activity tolerance;Cardiopulmonary status limiting activity       PT Treatment Interventions DME instruction;Therapeutic activities;Functional mobility training;Therapeutic exercise;Gait training;Patient/family education;Balance training    PT Goals (Current goals can be found in the Care Plan section)  Acute Rehab PT Goals Patient Stated Goal: none stated PT Goal Formulation: With patient/family Time For Goal Achievement: 07/26/18 Potential to Achieve Goals: Good    Frequency Min 2X/week   Barriers to discharge        Co-evaluation               AM-PAC PT "6 Clicks" Daily Activity  Outcome Measure Difficulty turning over in bed (including adjusting bedclothes, sheets and blankets)?: A Little Difficulty moving from lying on back to sitting on the side of the bed? : A Lot Difficulty sitting down on and standing up from a chair with arms (e.g., wheelchair, bedside commode, etc,.)?: Unable Help needed moving to and from a bed to chair (including a wheelchair)?: A Little Help needed walking in hospital room?: A Little Help needed climbing 3-5 steps with a railing? : A Lot 6 Click Score: 14    End of Session Equipment Utilized During Treatment: Oxygen Activity Tolerance: Patient limited by fatigue Patient left: in chair;with chair alarm set;with family/visitor present;with call bell/phone within reach;with nursing/sitter in room Nurse Communication: Mobility status PT Visit Diagnosis: Difficulty in walking, not elsewhere classified (R26.2);Unsteadiness on feet (R26.81)    Time: 1032-1050 PT Time Calculation (min) (ACUTE ONLY): 18 min   Charges:   PT Evaluation $PT Eval Low Complexity: 1 Low        Zenovia JarredKati Letishia Elliott, PT, DPT Acute Rehabilitation Services Office: [phone] Pager: [phone]  [place]e Rollo,KATHrine E 07/12/2018, 11:08 AM

## 2018-07-12 NOTE — Care Management Note (Signed)
Case Management Note  Patient Details  Name: Deborah MoronBetty B Lycan MRN: [id number] Date of Birth: 09-Aug-1929  Subjective/Objective:From [facility]-ALF. Has home 02.Hx: dementia, DNR. PT-recc SNF if unable to return back ALF. Cardio has signed off.                    Action/Plan:d/c back ALF.   Expected Discharge Date:                  Expected Discharge Plan:  Assisted Living / Rest Home  In-House Referral:  Clinical Social Work  Discharge planning Services  CM Consult  Post Acute Care Choice:  Durable Medical Equipment(home 02) Choice offered to:     DME Arranged:    DME Agency:     HH Arranged:    HH Agency:     Status of Service:  In process, will continue to follow  If discussed at Long Length of Stay Meetings, dates discussed:    Additional Comments:  Lanier ClamMahabir, Jordynne Mccown, RN 07/12/2018, 2:02 PM

## 2018-07-13 DIAGNOSIS — J441 Chronic obstructive pulmonary disease with (acute) exacerbation: Secondary | ICD-10-CM | POA: Diagnosis not present

## 2018-07-13 DIAGNOSIS — R05 Cough: Secondary | ICD-10-CM | POA: Diagnosis not present

## 2018-07-13 DIAGNOSIS — F0390 Unspecified dementia without behavioral disturbance: Secondary | ICD-10-CM | POA: Diagnosis not present

## 2018-07-13 DIAGNOSIS — I2510 Atherosclerotic heart disease of native coronary artery without angina pectoris: Secondary | ICD-10-CM | POA: Diagnosis not present

## 2018-07-13 LAB — TROPONIN I: Troponin I: <0.03 ng/mL (ref <0.03)

## 2018-07-13 LAB — ECHOCARDIOGRAM COMPLETE

## 2018-07-13 MED ORDER — HYDROCODONE-ACETAMINOPHEN 5-325 MG PO TABS: 1.0000 | ORAL_TABLET | ORAL | Status: DC | PRN

## 2018-07-13 MED ORDER — PREDNISONE 20 MG PO TABS: 20.0000 mg | ORAL_TABLET | Freq: Every day | ORAL | 0 refills | Status: DC

## 2018-07-13 MED ORDER — OSELTAMIVIR PHOSPHATE 30 MG PO CAPS: 30.0000 mg | ORAL_CAPSULE | Freq: Every day | ORAL | 0 refills | Status: DC

## 2018-07-13 MED ORDER — LORAZEPAM 2 MG/ML IJ SOLN
1.0000 mg | Freq: Once | INTRAMUSCULAR | Status: AC
  Administered 2018-07-13: 1 mg via INTRAMUSCULAR
  Filled 2018-07-13: qty 1

## 2018-07-13 MED ORDER — IPRATROPIUM-ALBUTEROL 0.5-2.5 (3) MG/3ML IN SOLN
3.0000 mL | Freq: Two times a day (BID) | RESPIRATORY_TRACT | Status: DC
  Administered 2018-07-13 – 2018-07-14 (×2): 3 mL via RESPIRATORY_TRACT
  Filled 2018-07-13 (×2): qty 3

## 2018-07-13 MED ORDER — DOXYCYCLINE HYCLATE 100 MG PO TABS: 100.0000 mg | ORAL_TABLET | Freq: Two times a day (BID) | ORAL | 0 refills | Status: AC

## 2018-07-13 NOTE — Discharge Summary (Addendum)
Discharge Summary  [name] ZOX:[id number] DOB: 1929/05/21  PCP: [name], MD  Admit date: 07/11/2018 Discharge date: 07/14/18  Time spent: < 25 minutes  Admitted From: Memory Care unit Disposition:  Memory Care unit, [facility]  Recommendations for Outpatient Follow-up:  1. Follow up with PCP in 1-2 weeks 2. Tamiflu , end date 11/18 3. Prednisone 20 mg , end date 11/18 4. Doxycycline, end date 11/19 5. PRN nebs 6. Magic mouthwash(w/o lidocaine) PRN    Discharge Diagnoses:  Active Hospital Problems   Diagnosis Date Noted  . Influenza due to influenza virus, type B 07/12/2018  . Elevated troponin 07/11/2018  . COPD with acute exacerbation (HCC) 07/11/2018  . Acute encephalopathy 07/11/2018  . Prolonged QT interval 07/11/2018  . Dementia (HCC) 10/13/2017  . Coronary artery disease 08/26/2017  . WPW (Wolff-Parkinson-White syndrome)     Resolved Hospital Problems  No resolved problems to display.    Discharge Condition: Stable   CODE STATUS:DNR   Vitals:   07/14/18 1008 07/14/18 1023  BP:  (!) 102/59  Pulse:  95  Resp:    Temp: 100 F (37.8 C)   SpO2:  90%    History of present illness:   [name] is a 84 y.o. year old female with medical history significant for Dementia , COPD on PRN 1-2 L of O2, Systolic CHF who presented on 82/19/1478 with SOB and nonproductive cough and malaise for several days followed by low grade fever at her facility and was found to have COPD exacerbation secondary to influenza B infection.Remaining hospital course addressed in problem based format below:   Hospital Course:   COPD exacerbation secondary to influenza B infection  Presented with cough, shortness of breath over several days with reports of low grade fever at facility but did not require any supplemental oxygen.  Chest x-ray was negative for pneumonia, lab work was positive for influenza B.  Patient was started on Tamiflu despite being out of 48 hour  timeframe of initiation of symptoms given COPD comorbidity.  Received one-time dose of Solu-Medrol before transitioning to oral prednisone on 11/14 and continuation of oral doxycycline with continued improvement.  She will continue 20 mg of prednison, doxycycline and Tamiflu with end dates mentioned above.  Continue supportive care with flutter valve and incentive spirometry PRN. She would benefit from PRN nebulizer treatments at her facility.   Slight troponin elevation, lijely demand ischemia with flat troponin trend and nonischemic EKG and asymptomatic. Cardiology agrees this is likely due to COPD exacerbation/flu and recommend no further work up  Chronic combined CHF with reduced ejection fraction and diastolic dysfunction.    Repeat TTE here showed improvement in EF to 30 to 35%, cardiology did not recommend any further work-up.  Remained euvolemic on exam throughout observation status.  Has been off beta-blocker due to history of WPW  Prolonged QTC.  History of prolonged QTC with no apparent arrhythmias ore ectopy on telemetry during stay.  Avoid QT prolonging medications. (magic mouthwash script provided without lidocaine)  Acute encephalopathy, likely delirium in the setting of acute illness and patient with baseline dementia. Resolved.  Alert and talkative on exam today very pleasant.  Oriented to self.  Have lowered prednisone dosing to 20 mg to avoid agitation while treating with short burst for COPD.   CAD, stable. No chest pain. Home aspirin and imdur  Hypertension, stable.  Continue home losartan  Dementia, stable. Home seroquel.   Depression, stable. Home zoloft  Chronic back pain,  Stable. Home fentanyl patch and norco PRN q4H and tramadol every 6 H PRN moderate pain   Consultations:  Cardiology  Procedures/Studies: TTE, 07/12/18, EF 40-45%, grad 1 diastolic dysfunction  Discharge Exam: BP (!) 102/59   Pulse 95   Temp 100 F (37.8 C) (Oral)   Resp 19   SpO2  90%   General: Lying in bed, no apparent distress Eyes: EOMI, anicteric ENT: Oral Mucosa clear and moist Cardiovascular: regular rate and rhythm,  no edema, Respiratory: Normal respiratory effort, lungs clear to auscultation bilaterally, increased upper airway noise without wheezing or crackles Abdomen: soft, non-distended, non-tender, normal bowel sounds Skin: No Rash Neurologic: Grossly no focal neuro deficit.Mental status alert, oriented to self, place.  Psychiatric:Appropriate affect, and mood   Discharge Instructions You were cared for by a hospitalist during your hospital stay. If you have any questions about your discharge medications or the care you received while you were in the hospital after you are discharged, you can call the unit and asked to speak with the hospitalist on call if the hospitalist that took care of you is not available. Once you are discharged, your primary care physician will handle any further medical issues. Please note that NO REFILLS for any discharge medications will be authorized once you are discharged, as it is imperative that you return to your primary care physician (or establish a relationship with a primary care physician if you do not have one) for your aftercare needs so that they can reassess your need for medications and monitor your lab values.  Discharge Instructions    Diet - low sodium heart healthy   Complete by:  As directed    Diet - low sodium heart healthy   Complete by:  As directed    Increase activity slowly   Complete by:  As directed    Increase activity slowly   Complete by:  As directed      Allergies as of 07/14/2018      Reactions   Amoxicillin Anaphylaxis, Rash   Ciprofloxacin Anaphylaxis, Other (See Comments)   Thrush   Lorabid [loracarbef] Anaphylaxis, Rash   Avelox [moxifloxacin] Other (See Comments)   Tendon and joint pain   Erythromycin Other (See Comments)   angioedema   Inderal [propranolol] Other (See  Comments)   Vasculitis   Chantix [varenicline] Nausea Only      Medication List    STOP taking these medications   magic mouthwash w/lidocaine Soln   tamsulosin 0.4 MG Caps capsule Commonly known as:  FLOMAX     TAKE these medications   acetaminophen 500 MG tablet Commonly known as:  TYLENOL Take 500 mg by mouth every 6 (six) hours as needed for moderate pain.   ACIDOPHILUS LACTOBACILLUS PO Take 1 tablet by mouth every Monday, Wednesday, and Friday.   albuterol 108 (90 Base) MCG/ACT inhaler Commonly known as:  PROVENTIL HFA;VENTOLIN HFA Inhale 2 puffs into the lungs every 6 (six) hours as needed for wheezing or shortness of breath.   ALPRAZolam 0.25 MG tablet Commonly known as:  XANAX Take 0.25 mg by mouth 2 (two) times daily.   aspirin EC 81 MG tablet Take 81 mg by mouth daily.   B Complex-B12 Tabs Take 1 tablet by mouth daily.   BREO ELLIPTA 200-25 MCG/INH Aepb Generic drug:  fluticasone furoate-vilanterol Inhale 1 puff into the lungs at bedtime.   cholecalciferol 25 MCG (1000 UT) tablet Commonly known as:  VITAMIN D3 Take 1,000 Units by mouth daily.  doxycycline 100 MG tablet Commonly known as:  VIBRA-TABS Take 1 tablet (100 mg total) by mouth every 12 (twelve) hours for 4 days.   famotidine 10 MG tablet Commonly known as:  PEPCID Take 10 mg by mouth every 12 (twelve) hours as needed for heartburn or indigestion.   fentaNYL 25 MCG/HR patch Commonly known as:  DURAGESIC - dosed mcg/hr Place 1 patch (25 mcg total) onto the skin every 3 (three) days.   ferrous sulfate 325 (65 FE) MG tablet Take 325 mg by mouth every Monday, Wednesday, and Friday.   guaiFENesin 600 MG 12 hr tablet Commonly known as:  MUCINEX Take 600 mg by mouth every 12 (twelve) hours as needed for cough or to loosen phlegm.   ipratropium-albuterol 0.5-2.5 (3) MG/3ML Soln Commonly known as:  DUONEB Take 3 mLs by nebulization every 6 (six) hours as needed (shortness of breath).     isosorbide mononitrate 30 MG 24 hr tablet Commonly known as:  IMDUR Take 30 mg by mouth daily.   loratadine 10 MG tablet Commonly known as:  CLARITIN Take 10 mg by mouth daily.   losartan 50 MG tablet Commonly known as:  COZAAR Take 50 mg by mouth daily.   Magnesium 250 MG Tabs Take 1 tablet by mouth daily.   methocarbamol 500 MG tablet Commonly known as:  ROBAXIN Take 0.5 tablets (250 mg total) by mouth at bedtime as needed.   nitroGLYCERIN 0.4 MG SL tablet Commonly known as:  NITROSTAT Place 1 tablet (0.4 mg total) under the tongue every 5 (five) minutes x 3 doses as needed for chest pain.   ondansetron 4 MG tablet Commonly known as:  ZOFRAN Take 4 mg by mouth every 8 (eight) hours as needed for nausea or vomiting.   oseltamivir 30 MG capsule Commonly known as:  TAMIFLU Take 1 capsule (30 mg total) by mouth daily for 2 days. Start taking on:  07/15/2018   OXYGEN Inhale 1 L into the lungs as needed (oxygen saturation 87% or below).   pantoprazole 40 MG tablet Commonly known as:  PROTONIX Take 40 mg by mouth 2 (two) times daily.   polyethylene glycol packet Commonly known as:  MIRALAX / GLYCOLAX Take 17 g by mouth daily.   pramipexole 1 MG tablet Commonly known as:  MIRAPEX Take 1 mg by mouth at bedtime.   pramipexole 0.5 MG tablet Commonly known as:  MIRAPEX Take 0.5 mg by mouth daily as needed.   predniSONE 20 MG tablet Commonly known as:  DELTASONE Take 1 tablet (20 mg total) by mouth daily with breakfast for 2 days. Start taking on:  07/15/2018   QUEtiapine 100 MG tablet Commonly known as:  SEROQUEL Take 1 tablet (100 mg total) by mouth at bedtime. Only gives 100 mg four times a week What changed:    how much to take  when to take this  additional instructions   sennosides-docusate sodium 8.6-50 MG tablet Commonly known as:  SENOKOT-S Take 1 tablet by mouth 2 (two) times daily.   sertraline 25 MG tablet Commonly known as:  ZOLOFT Take 1  tablet (25 mg total) by mouth at bedtime.   traMADol 50 MG tablet Commonly known as:  ULTRAM Take 50 mg by mouth every 6 (six) hours as needed for moderate pain.      Allergies  Allergen Reactions  . Amoxicillin Anaphylaxis and Rash  . Ciprofloxacin Anaphylaxis and Other (See Comments)    Thrush  . Lorabid [Loracarbef] Anaphylaxis and Rash  .  Avelox [Moxifloxacin] Other (See Comments)    Tendon and joint pain  . Erythromycin Other (See Comments)    angioedema  . Inderal [Propranolol] Other (See Comments)    Vasculitis  . Chantix [Varenicline] Nausea Only      The results of significant diagnostics from this hospitalization (including imaging, microbiology, ancillary and laboratory) are listed below for reference.    Significant Diagnostic Studies: Dg Chest Port 1 View  Result Date: 07/11/2018 CLINICAL DATA:  84 year old female with productive cough and shortness of breath EXAM: PORTABLE CHEST 1 VIEW COMPARISON:  Prior chest x-ray 09/19/2017 FINDINGS: Stable cardiomegaly. Atherosclerotic calcifications again noted in the transverse aorta. No focal airspace consolidation to suggest pneumonia. No pulmonary edema, pleural effusion or pneumothorax. Stable chronic bronchitic changes. No acute osseous abnormality. Epidural spinal stimulator. Incompletely imaged anterior cervical stabilization hardware. Right humeral head soft tissue anchor. No acute osseous abnormality. IMPRESSION: Stable chest x-ray without evidence of acute cardiopulmonary process. Cardiomegaly and Aortic Atherosclerosis (ICD10-170.0). Electronically Signed   By: Malachy Moan M.D.   On: 07/11/2018 20:44    Microbiology: Recent Results (from the past 240 hour(s))  Culture, blood (routine x 2)     Status: None (Preliminary result)   Collection Time: 07/11/18 10:57 PM  Result Value Ref Range Status   Specimen Description   Final    BLOOD RIGHT HAND Performed at [facility], [address] [place], [place] [address]    Special Requests   Final    BOTTLES DRAWN AEROBIC AND ANAEROBIC Blood Culture results may not be optimal due to an inadequate volume of blood received in culture bottles Performed at [facility], [address] [place], [place] [address]    Culture   Final    NO GROWTH 2 DAYS Performed at [facility], [address] [place], [place] [address]    Report Status PENDING  Incomplete  Respiratory Panel by PCR     Status: None   Collection Time: 07/12/18  3:47 AM  Result Value Ref Range Status   Adenovirus NOT DETECTED NOT DETECTED Final   Coronavirus 229E NOT DETECTED NOT DETECTED Final   Coronavirus HKU1 NOT DETECTED NOT DETECTED Final   Coronavirus NL63 NOT DETECTED NOT DETECTED Final   Coronavirus OC43 NOT DETECTED NOT DETECTED Final   Metapneumovirus NOT DETECTED NOT DETECTED Final   Rhinovirus / Enterovirus NOT DETECTED NOT DETECTED Final   Influenza A NOT DETECTED NOT DETECTED Final   Influenza B NOT DETECTED NOT DETECTED Final   Parainfluenza Virus 1 NOT DETECTED NOT DETECTED Final   Parainfluenza Virus 2 NOT DETECTED NOT DETECTED Final   Parainfluenza Virus 3 NOT DETECTED NOT DETECTED Final   Parainfluenza Virus 4 NOT DETECTED NOT DETECTED Final   Respiratory Syncytial Virus NOT DETECTED NOT DETECTED Final   Bordetella pertussis NOT DETECTED NOT DETECTED Final   Chlamydophila pneumoniae NOT DETECTED NOT DETECTED Final   Mycoplasma pneumoniae NOT DETECTED NOT DETECTED Final    Comment: Performed at [facility], [address] [place], [place] [address]  Culture, blood (routine x 2)     Status: None (Preliminary result)   Collection Time: 07/12/18  5:20 AM  Result Value Ref Range Status   Specimen Description   Final    BLOOD RIGHT ANTECUBITAL Performed at [facility], [address] [place], [place] [address]    Special Requests   Final    BOTTLES DRAWN AEROBIC AND ANAEROBIC Blood Culture  adequate volume Performed at [facility], [address], [place], [place] [address]    Culture   Final    NO GROWTH 2 DAYS Performed at [facility], [address] [place], [place] [address]    Report Status PENDING  Incomplete  MRSA PCR Screening     Status: None   Collection Time: 07/12/18 11:20 AM  Result Value Ref Range Status   MRSA by PCR NEGATIVE NEGATIVE Final    Comment:        The GeneXpert MRSA Assay (FDA approved for NASAL specimens only), is one component of a comprehensive MRSA colonization surveillance program. It is not intended to diagnose MRSA infection nor to guide or monitor treatment for MRSA infections. Performed at [facility], [address], [place], [place] [address]      Labs: Basic Metabolic Panel: Recent Labs  Lab 07/11/18 1907 07/12/18 0520  NA 139 141  K 4.4 4.7  CL 104 104  CO2 25 24  GLUCOSE 109* 193*  BUN 29* 27*  CREATININE 1.20* 1.18*  CALCIUM 8.3* 8.7*  MG  --  2.1  PHOS  --  3.9   Liver Function Tests: Recent Labs  Lab 07/11/18 1907 07/12/18 0520  AST 39 35  ALT 19 20  ALKPHOS 64 57  BILITOT 0.8 0.6  PROT 6.7 6.7  ALBUMIN 3.5 3.7   No results for input(s): LIPASE, AMYLASE in the last 168 hours. No results for input(s): AMMONIA in the last 168 hours. CBC: Recent Labs  Lab 07/11/18 1907 07/12/18 0520  WBC 6.5 6.3  NEUTROABS 5.4  --   HGB 11.4* 11.8*  HCT 36.6 38.9  MCV 98.1 99.0  PLT 180 174   Cardiac Enzymes: Recent Labs  Lab 07/11/18 2310 07/12/18 0520 07/12/18 1119 07/12/18 1730 07/13/18 0546  TROPONINI 0.05* 0.03* 0.03* 0.03* <0.03   BNP: BNP (last 3 results) Recent Labs    07/11/18 1907  BNP 180.2*    ProBNP (last 3 results) No results for input(s): PROBNP in the last 8760 hours.  CBG: No results for input(s): GLUCAP in the last 168 hours.     Signed:  Laverna Peace, MD [facility] 07/14/2018, 11:34 AM

## 2018-07-13 NOTE — Progress Notes (Signed)
Initial Nutrition Assessment  DOCUMENTATION CODES:   (Unable to assess for malnutrition at this time)  INTERVENTION:  - Will assess for needs if patient unable to d/c today.    NUTRITION DIAGNOSIS:   Increased nutrient needs related to acute illness(influenza B leading to COPD exacerbation) as evidenced by estimated needs.  GOAL:   Patient will meet greater than or equal to 90% of their needs  MONITOR:   PO intake, Weight trends, Labs  REASON FOR ASSESSMENT:   Consult Assessment of nutrition requirement/status  ASSESSMENT:   84 y.o. year old female with medical history significant for dementia, COPD, and CHF. She presented to the ED from facility on 11/13 with SOB, non-productive cough, and malaise for several days followed by low grade fever. She was found to have COPD exacerbation secondary to influenza B infection.  Per flow sheet, patient is a/o to self only. Before entering the room, RN requested that patient not be awoke if she was sleeping. Patient sleeping with daughter at bedside; did not perform NFPE. Daughter reports that patient lives at a memory care unit where she receives 3 meals and 2 snacks/day. She usually eats best at breakfast and she is still able to self-feed although daughter feels that she may not be able to much longer.   Per flow sheet, patient consumed 30% of breakfast, 90% of lunch, and 50% of dinner yesterday and 50% of breakfast this AM. Daughter confirms that patient has been eating at all meals. She states that patient does best with soft items and needs meat cut into small pieces d/t only having upper partial (lower partial had been lost).   D/c order and summary are now. At the time of RD visit, daughter had reported that patient was to likely leave today but information concerning Tamiflu was still being determined.  Patient was weighed this admission and weighed 99 lb. Daughter reports that in August patient weighed 101-102 lb. No other weight  information available since February 2019 and, per chart, patient weighed 91 lb at that time. Daughter did state that patient has gained weight over the past year.    Medications reviewed; 40 mg oral Protonix BID, 1 packet Miralax/day, 40 mg Deltasone/day, 1 tablet Senokot BID. Labs reviewed; BUN: 27 mg/dL, creatinine: 1.611.18 mg/dL, Ca: 8.7 mg/dL, GFR: 40 mL/min.       NUTRITION - FOCUSED PHYSICAL EXAM:  Unable to complete.   Diet Order:   Diet Order            Diet - low sodium heart healthy        Diet Heart Room service appropriate? Yes; Fluid consistency: Thin  Diet effective now              EDUCATION NEEDS:   No education needs have been identified at this time  Skin:  Skin Assessment: Reviewed RN Assessment  Last BM:  11/13  Height:   Ht Readings from Last 1 Encounters:  10/12/17 5\' 1"  (1.549 m)    Weight:   Wt Readings from Last 1 Encounters:  10/12/17 41.2 kg    Ideal Body Weight:     BMI:  There is no height or weight on file to calculate BMI.  Estimated Nutritional Needs:   Kcal:  1345-1525 kcal  Protein:  45-55 grams  Fluid:  >/= 1.5 L/day     Trenton GammonJessica Sarath Privott, MS, RD, LDN, [facility] Inpatient Clinical Dietitian Pager # [phone] After hours/weekend pager # [phone]

## 2018-07-13 NOTE — Progress Notes (Signed)
OT Cancellation Note  Patient Details Name: [name] MRN: [id number] DOB: 1929/01/27   Cancelled Treatment:    Reason Eval/Treat Not Completed: Other (comment).  Too restless this am (8 am)  Will check another day.  Sharde Gover 07/13/2018, 9:52 AM  Marica OtterMaryellen Imran Nuon, OTR/L Acute Rehabilitation Services [phone] [facility] pager [phone] office 07/13/2018

## 2018-07-13 NOTE — Progress Notes (Signed)
Clinical Social Worker received phone call from Kamiahandace Veverly Fells([facility]) stating that patient will not be able to come back today. Candace stated that because patient has tested positive for the flu patient will need to be on Tamiflu for at least 48 hours before returning back to facility. Candace explained that the memory care patient stays in is a locked unit and patient is allowed to wonder in and out of other residents room. Candace stated she is just trying to protect the other residents from catching the flu. CSW made MD, daughter and RN on floor aware of facility decision. Candace stated patient can return back tomorrow 11/16.  Marrianne MoodAshley Miley Lindon, MSW,  Theresia MajorsLCSWA [phone]

## 2018-07-13 NOTE — Care Management Obs Status (Signed)
MEDICARE OBSERVATION STATUS NOTIFICATION   Patient Details  Name: Deborah MoronBetty B Welliver MRN: [id number] Date of Birth: 1929-01-13   Medicare Observation Status Notification Given:  Yes    MahabirOlegario Messier, Deboraha Goar, RN 07/13/2018, 10:00 AM

## 2018-07-13 NOTE — Progress Notes (Signed)
Pt this morning very restless and multiple attempts to climb out of the bed. RN unable to reorient Pt and Pt becomes agitated with staff. Pt is refusing to wear telemetry monitor and when attempt made Pt takes leads off and tries to throw monitor, MD paged and await response

## 2018-07-13 NOTE — Clinical Social Work Note (Signed)
Clinical Social Work Assessment  Patient Details  Name: [name] MRN: [id number] Date of Birth: [birth year]  Date of referral:  07/13/18               Reason for consult:  Discharge Planning                Permission sought to share information with:  Family Supports Permission granted to share information::  Yes, Verbal Permission Granted  Name::     Hetty Ely   Agency::  [facility] ([facility] section)  Relationship::  daughter  Contact Information:  [phone]  Housing/Transportation Living arrangements for the past 2 months:  Assisted Living Facility([facility] care) Source of Information:  Patient Patient Interpreter Needed:  None Criminal Activity/Legal Involvement Pertinent to Current Situation/Hospitalization:  No - Comment as needed Significant Relationships:  Adult Children, Community Support Lives with:  Facility Resident Do you feel safe going back to the place where you live?  Yes Need for family participation in patient care:      Care giving concerns:  CSW unable to assess patient as she is only orient to self. CSW spoke with patients daughter carol outside of patients room  Social Worker assessment / plan:  CSW met patients daughter outside of patients room. Arbie Cookey stated patient lives at [facility] in there Memory care unit [facility]) which is a lock unit. Arbie Cookey prefers patient returns back to facility so she can be in a familiar space.  CSW spoke with Med tech at [facility] and they are aware that patient will be returning. Facility able to take patient back    Employment status:  Retired Nurse, adult PT Recommendations:  [place] / Referral to community resources:  [place]  Patient/Family's Response to care:  Arbie Cookey supportive of patient and her needs. Per daughter she will transport patient back herself  Patient/Family's Understanding  of and Emotional Response to Diagnosis, Current Treatment, and Prognosis:  Patient to return back to [facility])  Emotional Assessment Appearance:  Appears stated age Attitude/Demeanor/Rapport:  Unable to Assess Affect (typically observed):  Unable to Assess Orientation:  Oriented to Self Alcohol / Substance use:  Not Applicable Psych involvement (Current and /or in the community):  No (Comment)  Discharge Needs  Concerns to be addressed:  No discharge needs identified Readmission within the last 30 days:  No Current discharge risk:  Dependent with Mobility Barriers to Discharge:  No Barriers Identified   Wende Neighbors, LCSW 07/13/2018, 10:06 AM

## 2018-07-13 NOTE — Progress Notes (Signed)
Pt violent and aggressive, trying to get out of bed and throwing things. Refused blood draws. Pt took telemetry off and will not let RN reapply. MD aware. New orders placed. RN will continue to monitor the pt closely.

## 2018-07-13 NOTE — Progress Notes (Signed)
PROGRESS NOTE  Deborah MoronBetty B [name] ZOX:[id number] DOB: Jul 20, 1929 DOA: 07/11/2018 PCP: Marden NobleGates, Robert, MD  HPI/Brief Narrative  [name] is a 84 y.o. year old female with medical history significant for Dementia , COPD on PRN 1-2 L of O2, Systolic CHF  who presented on 07/11/2018 with SOB and nonproductive cough and malaise for several days followed by low grade fever at her facility and was found to have COPD exacerbation secondary to influenza B infection.  Subjective Some agitation overnight  Assessment/Plan:  COPD exacerbation secondary to influenza B infection, improving.  Presented with cough, shortness of breath but not requiring any supplemental oxygen. Flu +, no pna on cxr. PRN duo-nebs, prednisone, doxycycline, mucinex. Tamiflu. Planned discharge today given rapid improvement however given she is returning to memory care unit with other residents facility requires at least 48 hours on Tamiflu before return.   .  Flutter valve, incentive spirometry as needed.  Monitor over the next 24 hours anticipate discharge in that time.  Slight troponin elevation, suspect demand ischemia with flat troponin trend and nonischemic EKG and asymptomatic.  Likely due to COPD exacerbation/flu.  Chronic combined CHF with reduced ejection fraction and diastolic dysfunction.  TTE shows slight improvement in EF, no intervention needed per cardiology.  Euvolemic on exam volume elevation of BNP at 180.  Daily weights, monitor closely.  Prolonged QTC.  History of prolonged QTC with no apparent arrhythmias per cardiology.  Will avoid QT prolonging medications.  No ectopy on telemetry.  Acute encephalopathy, likely delirium in the setting of acute illness and patient with baseline dementia.  Improved.  Alert and talkative on exam today very pleasant.  Oriented to self.  We will continue to monitor, delirium precautions.  CAD, stable. No chest pain. Home aspirin and imdur  Hypertension, stable.  Continue home  losartan  Dementia, stable. Home seroquel.   Depression, stable. Home zoloft  Chronic back pain, Stable. Home fentanyl patch and norco PRN q4H and tramadol every 6 H PRN moderate pain  Code Status: DNR   Family Communication: Daughter at bedside   Disposition Plan: likely discharge in 24 hours when facility can accept ( after 48 hours of Tamiflu)   Consultants:       Procedures:  11/14, TTE   Antimicrobials: Anti-infectives (From admission, onward)   Start     Dose/Rate Route Frequency Ordered Stop   07/13/18 0000  doxycycline (VIBRA-TABS) 100 MG tablet     100 mg Oral Every 12 hours 07/13/18 0906 07/17/18 2359   07/13/18 0000  oseltamivir (TAMIFLU) 30 MG capsule     30 mg Oral Daily 07/13/18 0906 07/16/18 2359   07/12/18 1000  oseltamivir (TAMIFLU) capsule 30 mg     30 mg Oral Daily 07/12/18 0944 07/17/18 0959   07/12/18 0015  doxycycline (VIBRA-TABS) tablet 100 mg     100 mg Oral Every 12 hours 07/12/18 0010 07/16/18 2159        Cultures:  Blood cultures, 11/13  Telemetry:yes  DVT prophylaxis: lovenox   Objective: Vitals:   07/13/18 0130 07/13/18 0521 07/13/18 0734 07/13/18 1505  BP: (!) 110/95 (!) 139/97  (!) 127/57  Pulse: 89 89 81 69  Resp: 17 18 17 20   Temp: 97.6 F (36.4 C) 97.9 F (36.6 C)  97.8 F (36.6 C)  TempSrc: Axillary Axillary  Oral  SpO2: 94% 98% 99% 92%    Intake/Output Summary (Last 24 hours) at 07/13/2018 1839 Last data filed at 07/13/2018 0952 Gross per 24 hour  Intake 410 ml  Output -  Net 410 ml   There were no vitals filed for this visit.  Exam:  Constitutional:normal appearing elderly female Eyes: EOMI, anicteric, normal conjunctivae ENMT: Oropharynx with moist mucous membranes Cardiovascular: RRR no MRGs, with no peripheral edema Respiratory: Normal respiratory effort on room air, upper airway congestion, no wheezing   Abdomen: Soft,non-tender, with no HSM Skin: No rash ulcers, or lesions. Without skin tenting   Neurologic: Grossly no focal neuro deficit. Psychiatric:Appropriate affect, and mood. Mental status alert to self, pleasantly demented otherwise Data Reviewed: CBC: Recent Labs  Lab 07/11/18 1907 07/12/18 0520  WBC 6.5 6.3  NEUTROABS 5.4  --   HGB 11.4* 11.8*  HCT 36.6 38.9  MCV 98.1 99.0  PLT 180 174   Basic Metabolic Panel: Recent Labs  Lab 07/11/18 1907 07/12/18 0520  NA 139 141  K 4.4 4.7  CL 104 104  CO2 25 24  GLUCOSE 109* 193*  BUN 29* 27*  CREATININE 1.20* 1.18*  CALCIUM 8.3* 8.7*  MG  --  2.1  PHOS  --  3.9   GFR: CrCl cannot be calculated (Unknown ideal weight.). Liver Function Tests: Recent Labs  Lab 07/11/18 1907 07/12/18 0520  AST 39 35  ALT 19 20  ALKPHOS 64 57  BILITOT 0.8 0.6  PROT 6.7 6.7  ALBUMIN 3.5 3.7   No results for input(s): LIPASE, AMYLASE in the last 168 hours. No results for input(s): AMMONIA in the last 168 hours. Coagulation Profile: No results for input(s): INR, PROTIME in the last 168 hours. Cardiac Enzymes: Recent Labs  Lab 07/11/18 2310 07/12/18 0520 07/12/18 1119 07/12/18 1730 07/13/18 0546  TROPONINI 0.05* 0.03* 0.03* 0.03* <0.03   BNP (last 3 results) No results for input(s): PROBNP in the last 8760 hours. HbA1C: No results for input(s): HGBA1C in the last 72 hours. CBG: No results for input(s): GLUCAP in the last 168 hours. Lipid Profile: No results for input(s): CHOL, HDL, LDLCALC, TRIG, CHOLHDL, LDLDIRECT in the last 72 hours. Thyroid Function Tests: Recent Labs    07/12/18 0520  TSH 0.423   Anemia Panel: No results for input(s): VITAMINB12, FOLATE, FERRITIN, TIBC, IRON, RETICCTPCT in the last 72 hours. Urine analysis:    Component Value Date/Time   COLORURINE AMBER (A) 07/11/2018 2038   APPEARANCEUR HAZY (A) 07/11/2018 2038   LABSPEC 1.020 07/11/2018 2038   PHURINE 5.0 07/11/2018 2038   GLUCOSEU NEGATIVE 07/11/2018 2038   HGBUR NEGATIVE 07/11/2018 2038   BILIRUBINUR NEGATIVE 07/11/2018 2038     KETONESUR NEGATIVE 07/11/2018 2038   PROTEINUR NEGATIVE 07/11/2018 2038   UROBILINOGEN 0.2 04/25/2014 1827   NITRITE NEGATIVE 07/11/2018 2038   LEUKOCYTESUR NEGATIVE 07/11/2018 2038   Sepsis Labs: @LABRCNTIP (procalcitonin:4,lacticidven:4)  ) Recent Results (from the past 240 hour(s))  Culture, blood (routine x 2)     Status: None (Preliminary result)   Collection Time: 07/11/18 10:57 PM  Result Value Ref Range Status   Specimen Description   Final    BLOOD RIGHT HAND Performed at [facility], [address], [place], [place] [address]    Special Requests   Final    BOTTLES DRAWN AEROBIC AND ANAEROBIC Blood Culture results may not be optimal due to an inadequate volume of blood received in culture bottles Performed at [facility], [address] [place], [place] [address]    Culture   Final    NO GROWTH 1 DAY Performed at [facility], [address], [place], [place] [address]    Report Status PENDING  Incomplete  Respiratory Panel by PCR     Status: None   Collection Time: 07/12/18  3:47 AM  Result Value Ref Range Status   Adenovirus NOT DETECTED NOT DETECTED Final   Coronavirus 229E NOT DETECTED NOT DETECTED Final   Coronavirus HKU1 NOT DETECTED NOT DETECTED Final   Coronavirus NL63 NOT DETECTED NOT DETECTED Final   Coronavirus OC43 NOT DETECTED NOT DETECTED Final   Metapneumovirus NOT DETECTED NOT DETECTED Final   Rhinovirus / Enterovirus NOT DETECTED NOT DETECTED Final   Influenza A NOT DETECTED NOT DETECTED Final   Influenza B NOT DETECTED NOT DETECTED Final   Parainfluenza Virus 1 NOT DETECTED NOT DETECTED Final   Parainfluenza Virus 2 NOT DETECTED NOT DETECTED Final   Parainfluenza Virus 3 NOT DETECTED NOT DETECTED Final   Parainfluenza Virus 4 NOT DETECTED NOT DETECTED Final   Respiratory Syncytial Virus NOT DETECTED NOT DETECTED Final   Bordetella pertussis NOT DETECTED NOT DETECTED Final   Chlamydophila  pneumoniae NOT DETECTED NOT DETECTED Final   Mycoplasma pneumoniae NOT DETECTED NOT DETECTED Final    Comment: Performed at [facility], [address] [place], [place] [address]  Culture, blood (routine x 2)     Status: None (Preliminary result)   Collection Time: 07/12/18  5:20 AM  Result Value Ref Range Status   Specimen Description   Final    BLOOD RIGHT ANTECUBITAL Performed at [facility], [address], [place], [place] [address]    Special Requests   Final    BOTTLES DRAWN AEROBIC AND ANAEROBIC Blood Culture adequate volume Performed at [facility], [address], [place], [place] [address]    Culture   Final    NO GROWTH < 24 HOURS Performed at [facility], [address], [place], [place] [address]    Report Status PENDING  Incomplete  MRSA PCR Screening     Status: None   Collection Time: 07/12/18 11:20 AM  Result Value Ref Range Status   MRSA by PCR NEGATIVE NEGATIVE Final    Comment:        The GeneXpert MRSA Assay (FDA approved for NASAL specimens only), is one component of a comprehensive MRSA colonization surveillance program. It is not intended to diagnose MRSA infection nor to guide or monitor treatment for MRSA infections. Performed at [facility], [address], [place], [place] [address]       Studies: No results found.  Scheduled Meds: . ALPRAZolam  0.25 mg Oral BID  . aspirin EC  81 mg Oral Daily  . doxycycline  100 mg Oral Q12H  . enoxaparin (LOVENOX) injection  40 mg Subcutaneous Daily  . fentaNYL  25 mcg Transdermal Q72H  . fluticasone furoate-vilanterol  1 puff Inhalation QHS  . ipratropium-albuterol  3 mL Nebulization BID  . isosorbide mononitrate  30 mg Oral Daily  . losartan  50 mg Oral Daily  . oseltamivir  30 mg Oral Daily  . pantoprazole  40 mg Oral BID  . polyethylene glycol  17 g Oral Daily  . pramipexole  0.5 mg Oral TID  . pramipexole  1 mg Oral QHS   . predniSONE  40 mg Oral Q breakfast  . QUEtiapine  100 mg Oral QHS  . QUEtiapine  50 mg Oral Daily  . senna-docusate  1 tablet Oral BID  . sertraline  25 mg Oral QHS  Continuous Infusions:   LOS: 0 days     Laverna Peace, MD [facility] Pager [phone]  If 7PM-7AM, please contact night-coverage [email or web address] Password [facility] 07/13/2018, 6:39 PM

## 2018-07-13 NOTE — Progress Notes (Signed)
[facility] will sign off.   Medication Recommendations:  Demand ischemia, borderline troponin in the settings of acute illness - flu.  Other recommendations (labs, testing, etc):  No further testing. Follow up as an outpatient:  As needed.  Tobias AlexanderKatarina Keary Waterson, MD 07/13/2018

## 2018-07-13 NOTE — Progress Notes (Addendum)
Pt received 1 mg ativan IM. Will continue to monitor pt.

## 2018-07-13 NOTE — Progress Notes (Signed)
MD returned page and MD will place new orders.

## 2018-07-14 DIAGNOSIS — R05 Cough: Secondary | ICD-10-CM | POA: Diagnosis not present

## 2018-07-14 DIAGNOSIS — R7989 Other specified abnormal findings of blood chemistry: Secondary | ICD-10-CM | POA: Diagnosis not present

## 2018-07-14 DIAGNOSIS — G9340 Encephalopathy, unspecified: Secondary | ICD-10-CM | POA: Diagnosis not present

## 2018-07-14 DIAGNOSIS — J441 Chronic obstructive pulmonary disease with (acute) exacerbation: Secondary | ICD-10-CM | POA: Diagnosis not present

## 2018-07-14 MED ORDER — PREDNISONE 20 MG PO TABS: 20.0000 mg | ORAL_TABLET | Freq: Every day | ORAL | 0 refills | Status: AC

## 2018-07-14 MED ORDER — PREDNISONE 20 MG PO TABS: 20.0000 mg | ORAL_TABLET | Freq: Every day | ORAL | Status: DC

## 2018-07-14 MED ORDER — MAGIC MOUTHWASH: 5.0000 mL | Freq: Four times a day (QID) | ORAL | 0 refills | Status: AC | PRN

## 2018-07-14 MED ORDER — OSELTAMIVIR PHOSPHATE 30 MG PO CAPS: 30.0000 mg | ORAL_CAPSULE | Freq: Every day | ORAL | 0 refills | Status: AC

## 2018-07-14 NOTE — NC FL2 (Signed)
MEDICAID FL2 LEVEL OF CARE SCREENING TOOL     IDENTIFICATION  Patient Name: Deborah MoronBetty B Neidig Birthdate: 1929/04/27 Sex: female Admission Date (Current Location): 07/11/2018  [facility] [place]   Facility and Address:  [facility],  [address], [place] [id number]      Provider Number: [id number]  Attending Physician Name and Address:  Laverna PeaceNettey, Shayla D, MD  Relative Name and Phone Number:       Current Level of Care: Hospital Recommended Level of Care: [facility] Prior Approval Number:    Date Approved/Denied:   PASRR Number:    Discharge Plan: Other (Comment)([facility])    Current Diagnoses: Patient Active Problem List   Diagnosis Date Noted  . Influenza due to influenza virus, type B 07/12/2018  . Elevated troponin 07/11/2018  . COPD with acute exacerbation (HCC) 07/11/2018  . Acute encephalopathy 07/11/2018  . Prolonged QT interval 07/11/2018  . Dementia (HCC) 10/13/2017  . At risk for adverse drug event 09/26/2017  . Normochromic normocytic anemia 09/26/2017  . Restless leg syndrome   . Protein-calorie malnutrition, severe 09/22/2017  . Closed left hip fracture, initial encounter (HCC) 09/19/2017  . Pneumococcal pneumonia (HCC) 08/26/2017  . Hypertension 08/26/2017  . COPD (chronic obstructive pulmonary disease) (HCC) 08/26/2017  . Psychosis (HCC) 08/26/2017  . Coronary artery disease 08/26/2017  . Sepsis (HCC) 08/17/2017  . Chronic combined systolic and diastolic CHF (congestive heart failure) (HCC) 08/17/2017  . Right lower lobe pneumonia (HCC) 08/17/2017  . Abnormal cardiovascular stress test 01/17/2013    Class: Acute  . Tobacco use disorder   . WPW (Wolff-Parkinson-White syndrome)     Orientation RESPIRATION BLADDER Height & Weight     Self  Normal Continent Weight:   Height:     BEHAVIORAL SYMPTOMS/MOOD NEUROLOGICAL BOWEL NUTRITION STATUS      Continent  Diet(heart healthy)  AMBULATORY STATUS COMMUNICATION OF NEEDS Skin   Limited Assist Verbally Normal                       Personal Care Assistance Level of Assistance  Bathing, Feeding, Dressing Bathing Assistance: Limited assistance Feeding assistance: Independent Dressing Assistance: Limited assistance     Functional Limitations Info  Sight, Hearing, Speech Sight Info: Adequate Hearing Info: Adequate Speech Info: Adequate    SPECIAL CARE FACTORS FREQUENCY  PT (By licensed PT), OT (By licensed OT)     PT Frequency: 3x wk OT Frequency: 3xwk            Contractures Contractures Info: Not present    Additional Factors Info  Code Status, Allergies Code Status Info: DNR Allergies Info: AMOXICILLIN, CIPROFLOXACIN, LORABID LORACARBEF, AVELOX MOXIFLOXACIN, ERYTHROMYCIN, INDERAL PROPRANOLOL, CHANTIX VARENICLINE           Current Medications (07/14/2018):  This is the current hospital active medication list Current Facility-Administered Medications  Medication Dose Route Frequency Provider Last Rate Last Dose  . acetaminophen (TYLENOL) tablet 650 mg  650 mg Oral Q6H PRN Therisa Doyneoutova, Anastassia, MD   650 mg at 07/12/18 1527   Or  . acetaminophen (TYLENOL) suppository 650 mg  650 mg Rectal Q6H PRN Doutova, Anastassia, MD      . ALPRAZolam Prudy Feeler(XANAX) tablet 0.25 mg  0.25 mg Oral BID Therisa Doyneoutova, Anastassia, MD   0.25 mg at 07/14/18 0858  . aspirin EC tablet 81 mg  81 mg Oral Daily Doutova, Anastassia, MD   81 mg at 07/14/18 0856  .  doxycycline (VIBRA-TABS) tablet 100 mg  100 mg Oral Q12H Doutova, Anastassia, MD   100 mg at 07/14/18 0857  . enoxaparin (LOVENOX) injection 40 mg  40 mg Subcutaneous Daily Doutova, Anastassia, MD   40 mg at 07/14/18 0856  . famotidine (PEPCID) tablet 10 mg  10 mg Oral Q12H PRN Doutova, Anastassia, MD      . fentaNYL (DURAGESIC - dosed mcg/hr) patch 25 mcg  25 mcg Transdermal Q72H Therisa Doyne, MD   25 mcg at 07/12/18 1053  . fluticasone  furoate-vilanterol (BREO ELLIPTA) 200-25 MCG/INH 1 puff  1 puff Inhalation QHS Therisa Doyne, MD   1 puff at 07/13/18 2022  . guaiFENesin (MUCINEX) 12 hr tablet 600 mg  600 mg Oral Q12H PRN Therisa Doyne, MD   600 mg at 07/12/18 0046  . HYDROcodone-acetaminophen (NORCO/VICODIN) 5-325 MG per tablet 1-2 tablet  1-2 tablet Oral Q4H PRN Roberto Scales D, MD      . ipratropium-albuterol (DUONEB) 0.5-2.5 (3) MG/3ML nebulizer solution 3 mL  3 mL Nebulization BID Roberto Scales D, MD   3 mL at 07/14/18 0813  . isosorbide mononitrate (IMDUR) 24 hr tablet 30 mg  30 mg Oral Daily Doutova, Anastassia, MD   30 mg at 07/14/18 0858  . levalbuterol (XOPENEX) nebulizer solution 0.63 mg  0.63 mg Nebulization Q6H PRN Doutova, Anastassia, MD      . losartan (COZAAR) tablet 50 mg  50 mg Oral Daily Doutova, Anastassia, MD   50 mg at 07/14/18 0857  . oseltamivir (TAMIFLU) capsule 30 mg  30 mg Oral Daily Roberto Scales D, MD   30 mg at 07/14/18 0858  . pantoprazole (PROTONIX) EC tablet 40 mg  40 mg Oral BID Therisa Doyne, MD   40 mg at 07/14/18 0857  . polyethylene glycol (MIRALAX / GLYCOLAX) packet 17 g  17 g Oral Daily Doutova, Anastassia, MD   17 g at 07/13/18 1021  . pramipexole (MIRAPEX) tablet 0.5 mg  0.5 mg Oral TID Therisa Doyne, MD   0.5 mg at 07/13/18 1523  . pramipexole (MIRAPEX) tablet 1 mg  1 mg Oral QHS Doutova, Anastassia, MD   1 mg at 07/13/18 2154  . [START ON 07/15/2018] predniSONE (DELTASONE) tablet 20 mg  20 mg Oral Q breakfast Roberto Scales D, MD      . QUEtiapine (SEROQUEL) tablet 100 mg  100 mg Oral QHS Therisa Doyne, MD   100 mg at 07/13/18 2154  . QUEtiapine (SEROQUEL) tablet 50 mg  50 mg Oral Daily Doutova, Anastassia, MD   50 mg at 07/14/18 0857  . senna-docusate (Senokot-S) tablet 1 tablet  1 tablet Oral BID Therisa Doyne, MD   1 tablet at 07/14/18 0858  . sertraline (ZOLOFT) tablet 25 mg  25 mg Oral QHS Therisa Doyne, MD   25 mg at 07/13/18 2154  .  traMADol (ULTRAM) tablet 50 mg  50 mg Oral Q6H PRN Therisa Doyne, MD         Discharge Medications: Please see discharge summary for a list of discharge medications.  Relevant Imaging Results:  Relevant Lab Results:   Additional Information SS#: [id number]  Althea Charon, LCSW

## 2018-07-14 NOTE — Progress Notes (Signed)
[facility], all questions answered.  PT to be transported by daughter [name].

## 2018-07-14 NOTE — Progress Notes (Signed)
Clinical Social Worker facilitated patient discharge including contacting patient family and facility to confirm patient discharge plans.  Clinical information faxed to facility and family agreeable with plan. Patients [facility]  .  RN to call  [phone] for report prior to discharge.  Clinical Social Worker will sign off for now as social work intervention is no longer needed. Please consult us again if new need arises.  Marrianne MoodAshley Huntleigh Doolen, MSW, [employer]CSWA [phone]

## 2018-07-17 LAB — CULTURE, BLOOD (ROUTINE X 2)
Culture: NO GROWTH
Culture: NO GROWTH
SPECIAL REQUESTS: ADEQUATE

## 2019-01-01 IMAGING — CR DG HIP (WITH OR WITHOUT PELVIS) 2-3V*L*
3 series · 3 of 3 positions shown · non-contrast
Comparison: None.

CLINICAL DATA: Initial evaluation for acute trauma, fall.

EXAM:
DG HIP (WITH OR WITHOUT PELVIS) 2-3V LEFT

[w hip lat left]
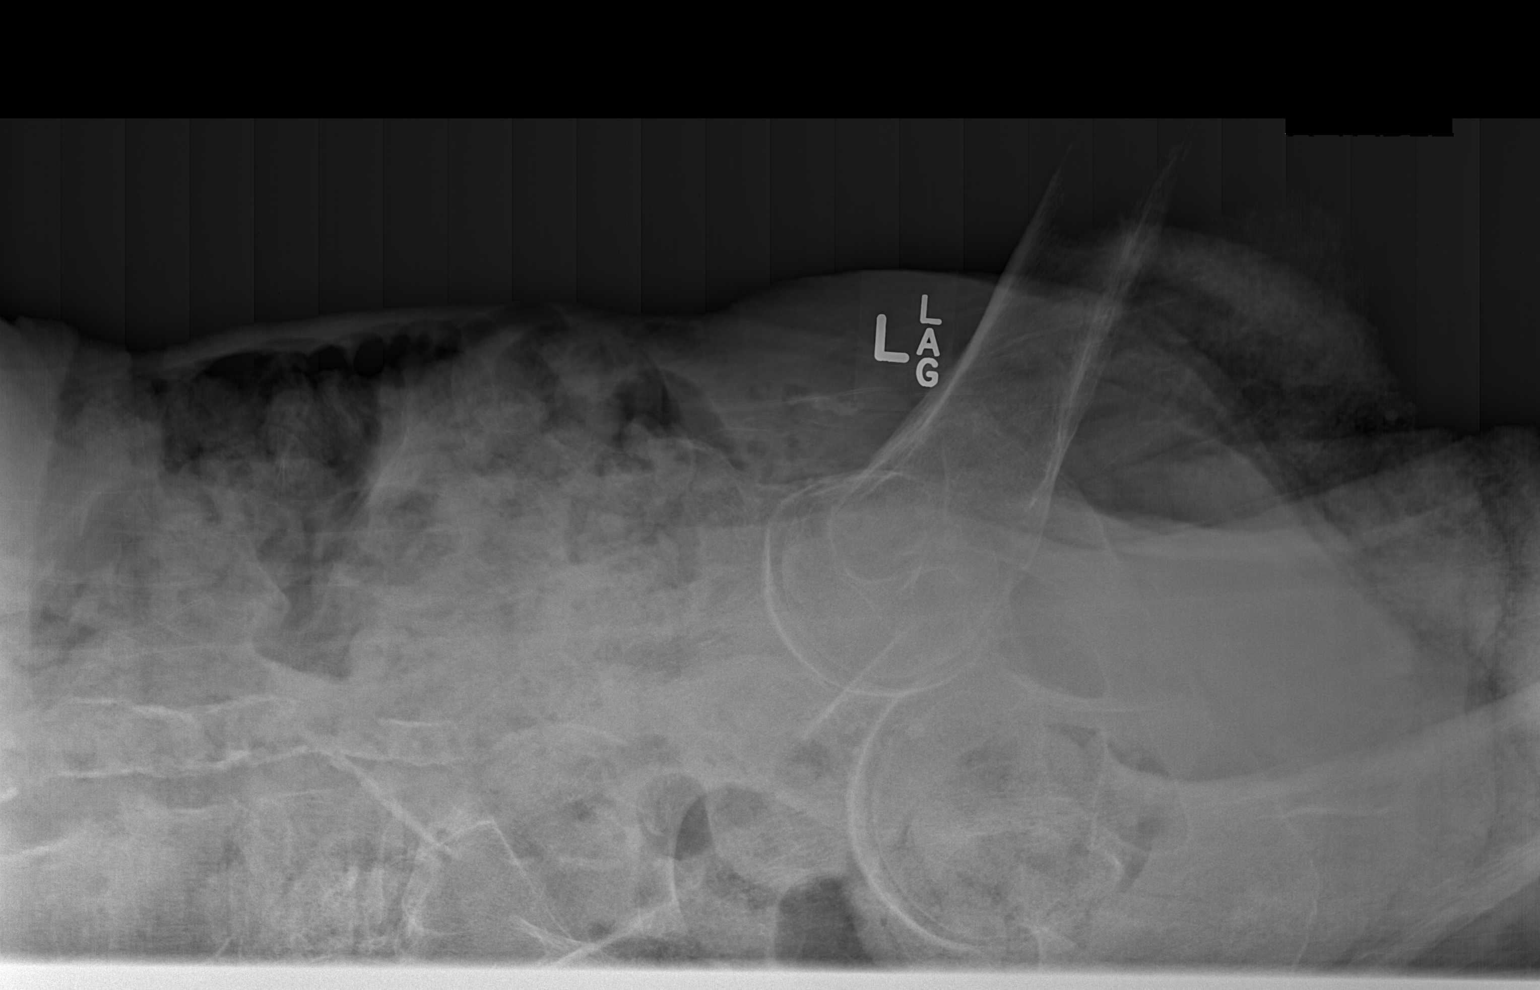

[x pelvis]
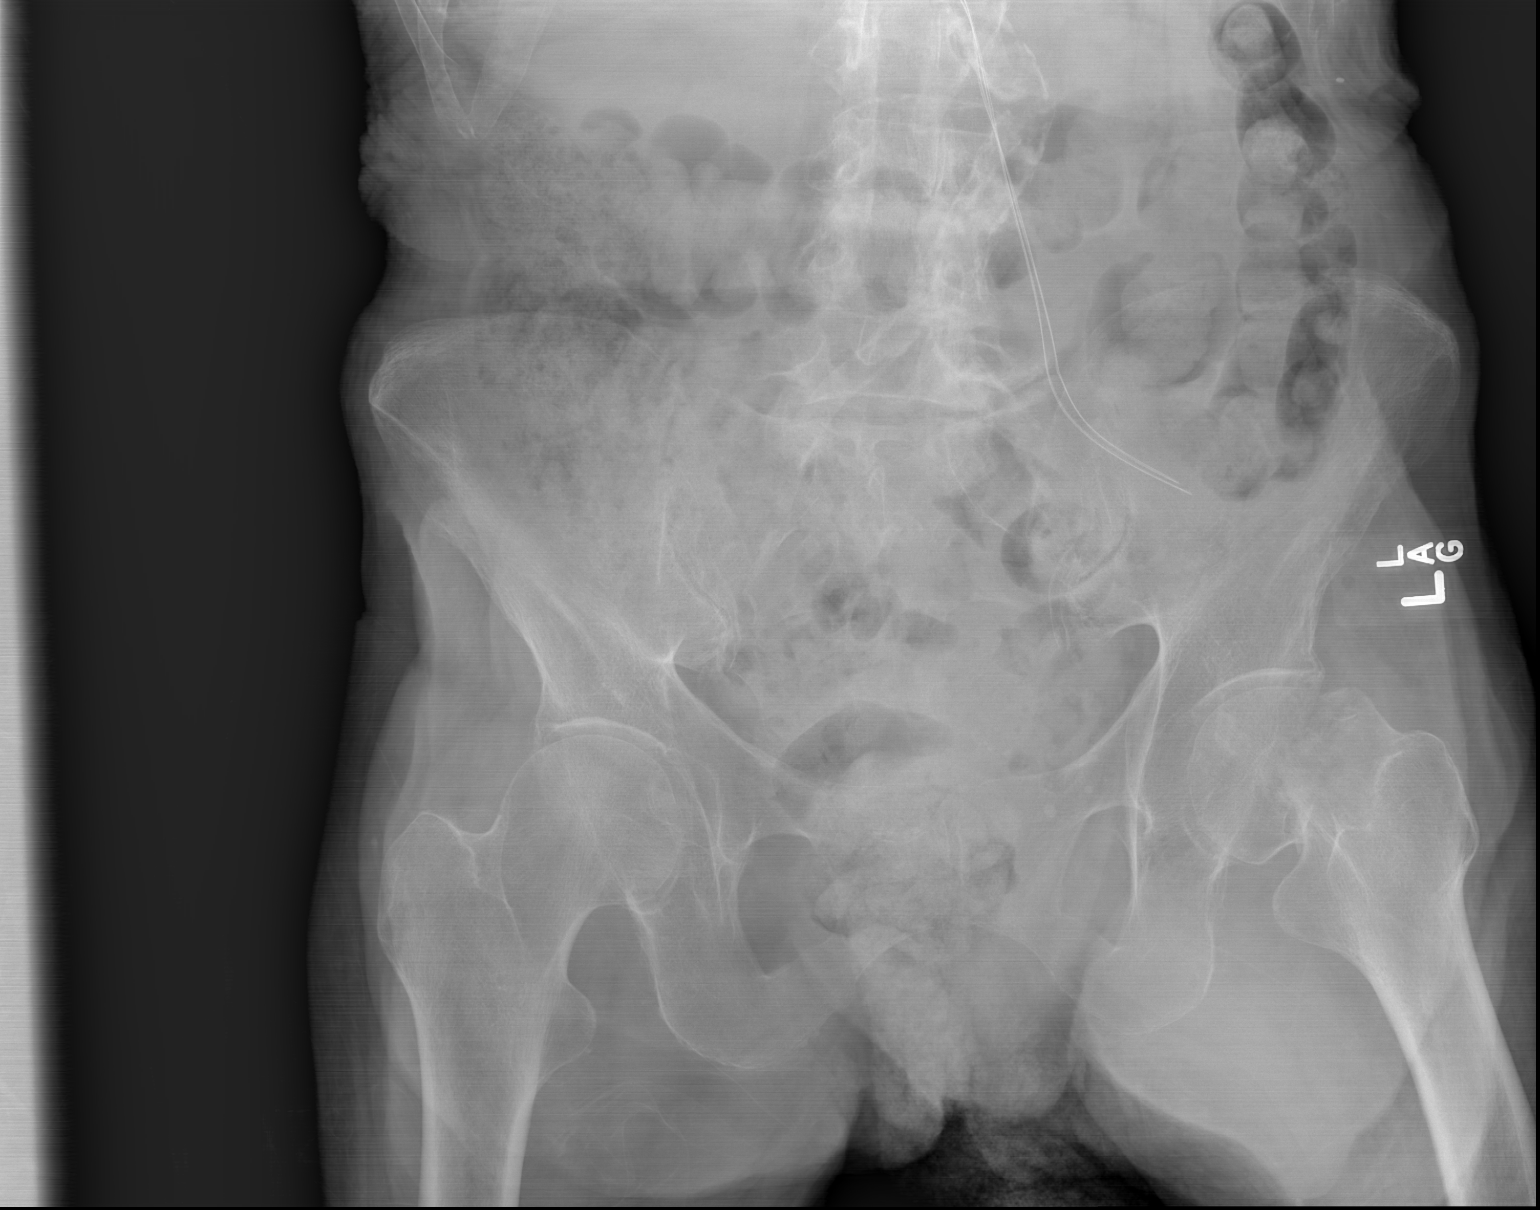

[x hip ap left]
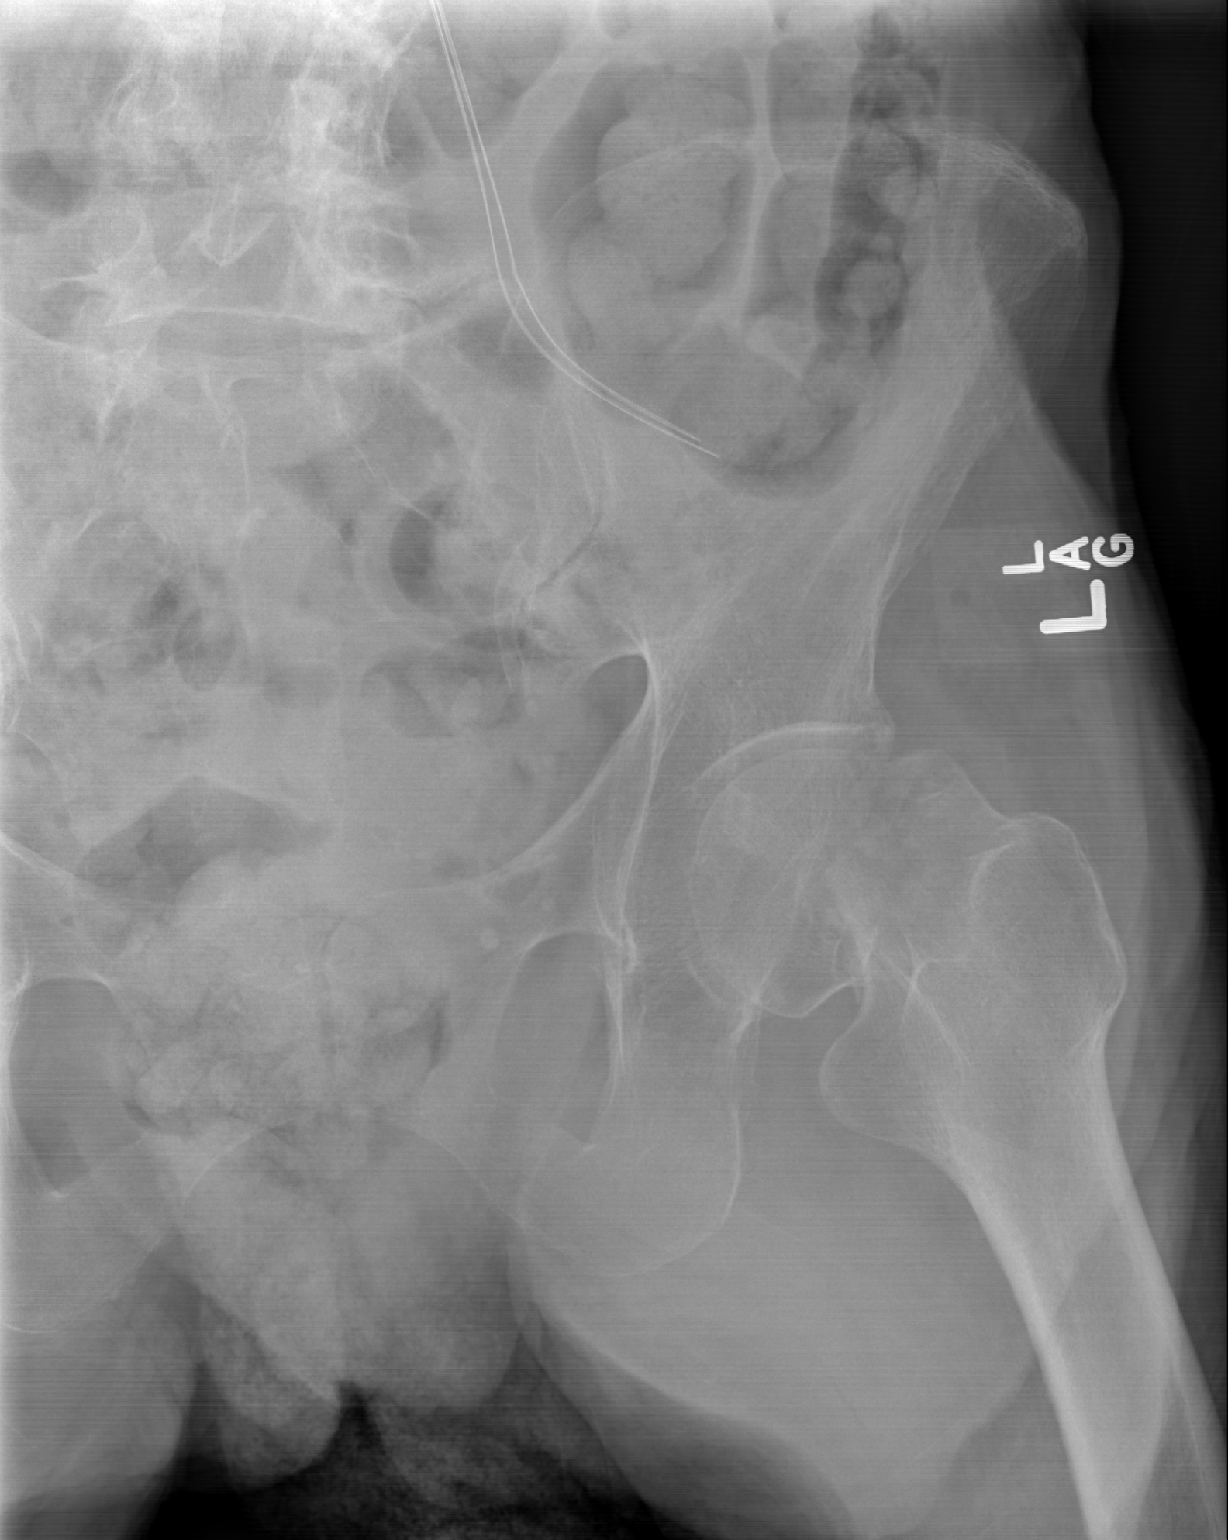

[3 of 3 positions shown; findings below may reference images not displayed]

FINDINGS: There is an acute fracture subcapital fracture extending through the
left femoral neck with slight superior subluxation. Femoral head
remains normally aligned within the acetabulum. Limited views of the
right hip demonstrate no acute abnormality. Bony pelvis intact.
Advanced osteopenia.

No acute soft tissue abnormality. Prominent aorto bi-iliac
atherosclerotic disease noted.
IMPRESSION: Acute mildly displaced subcapital fracture of the left femoral neck.

## 2019-01-02 IMAGING — CR DG CHEST 1V
1 series · 1 of 1 positions shown · non-contrast
Comparison: Chest x-ray of August 17, 2017

CLINICAL DATA: Unwitnessed fall.  History of hypertension and COPD.

EXAM:
CHEST 1 VIEW

[x chest ap]
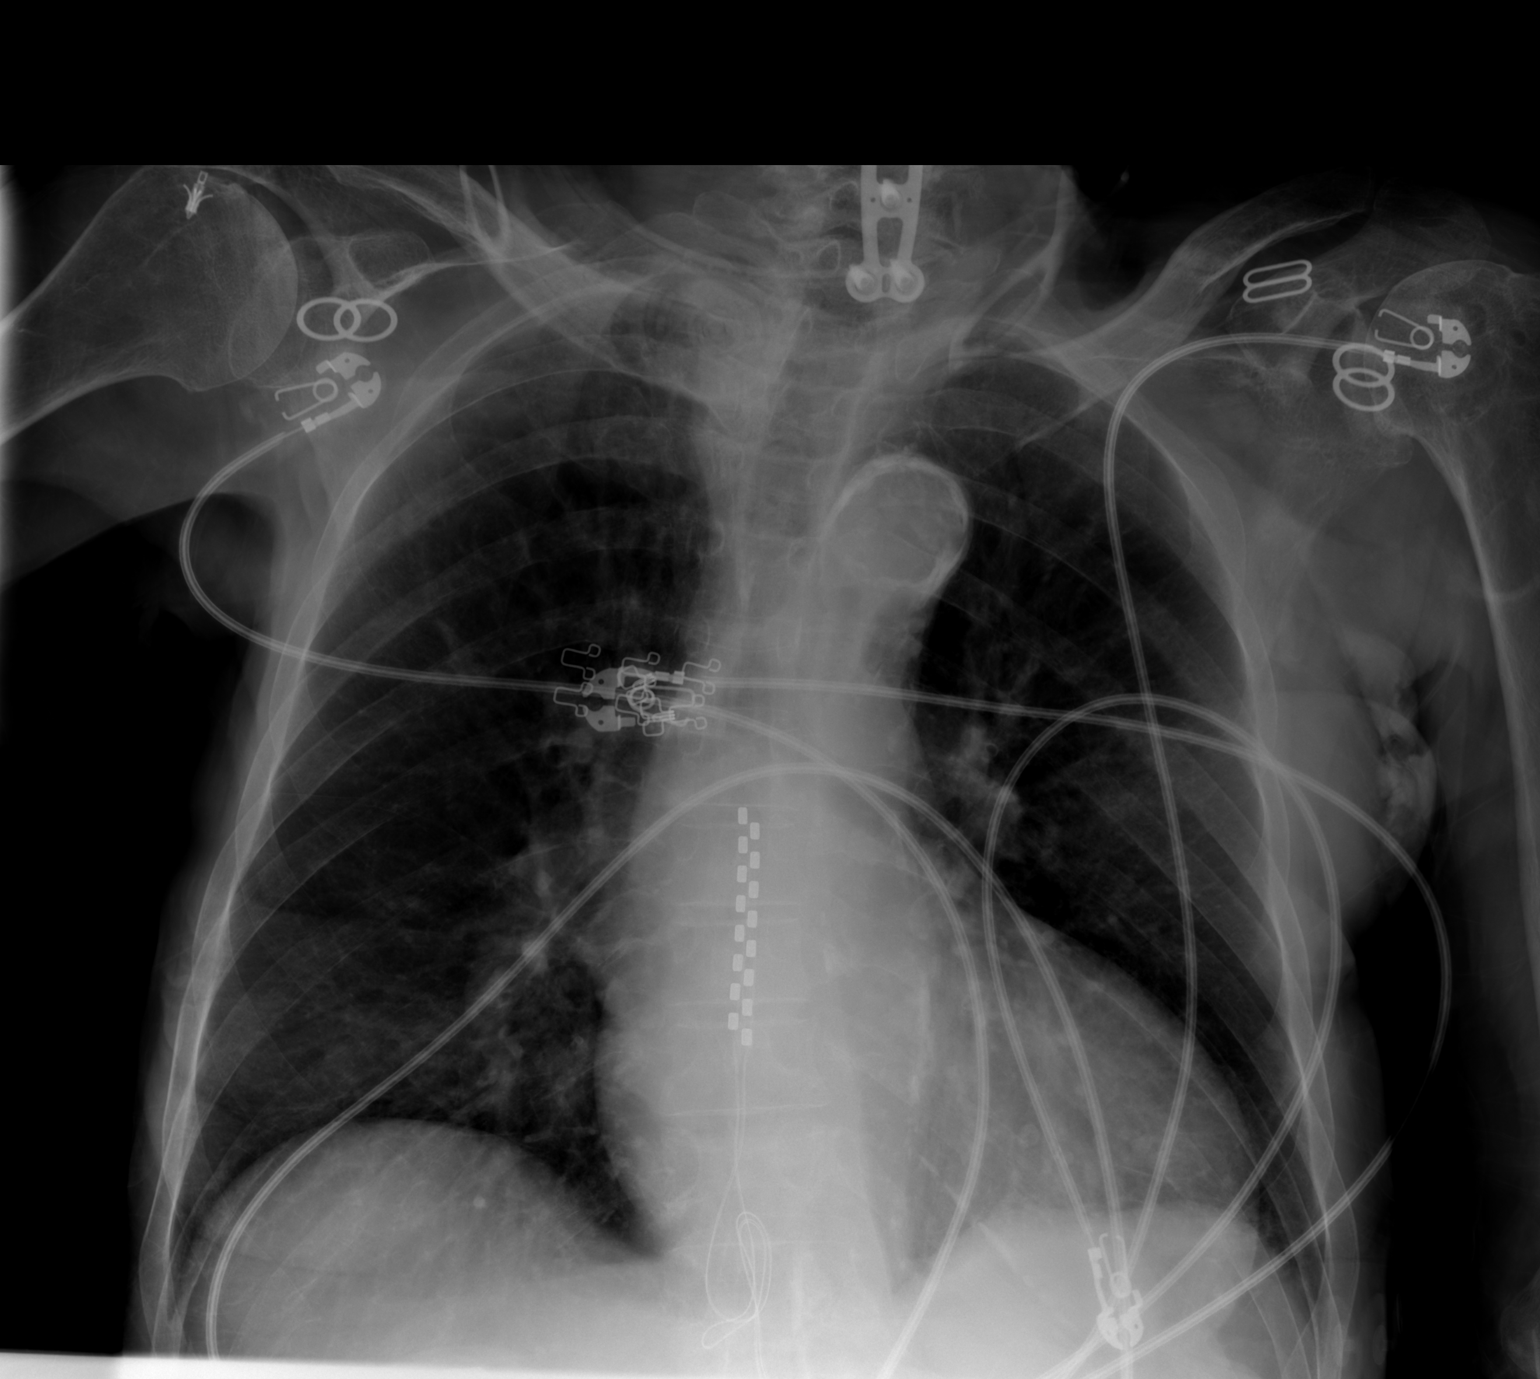

[1 of 1 positions shown; findings below may reference images not displayed]

FINDINGS: The lungs are well-expanded. There is no focal infiltrate. The heart
is top-normal in size. There is dense calcification in the wall of
the thoracic aorta. The pulmonary vascularity is not engorged. The
observed portions of the bony thorax exhibit no acute abnormalities.
A nerve stimulator electrode is present over the mid to lower
thoracic spine. The patient has undergone previous lower anterior
cervical fusion.
IMPRESSION: There is no acute cardiopulmonary abnormality.

Thoracic aortic atherosclerosis.

## 2019-01-03 IMAGING — DX DG PORTABLE PELVIS
1 series · 2 of 2 positions shown · non-contrast
Comparison: 08/30/2017, radiograph 09/18/2016

CLINICAL DATA: Postop hip replacement

EXAM:
PORTABLE PELVIS 1-2 VIEWS

[Series 1: pelvis ap · 0.14mm/px · 2 of 2 slices shown]
[im 1/2]
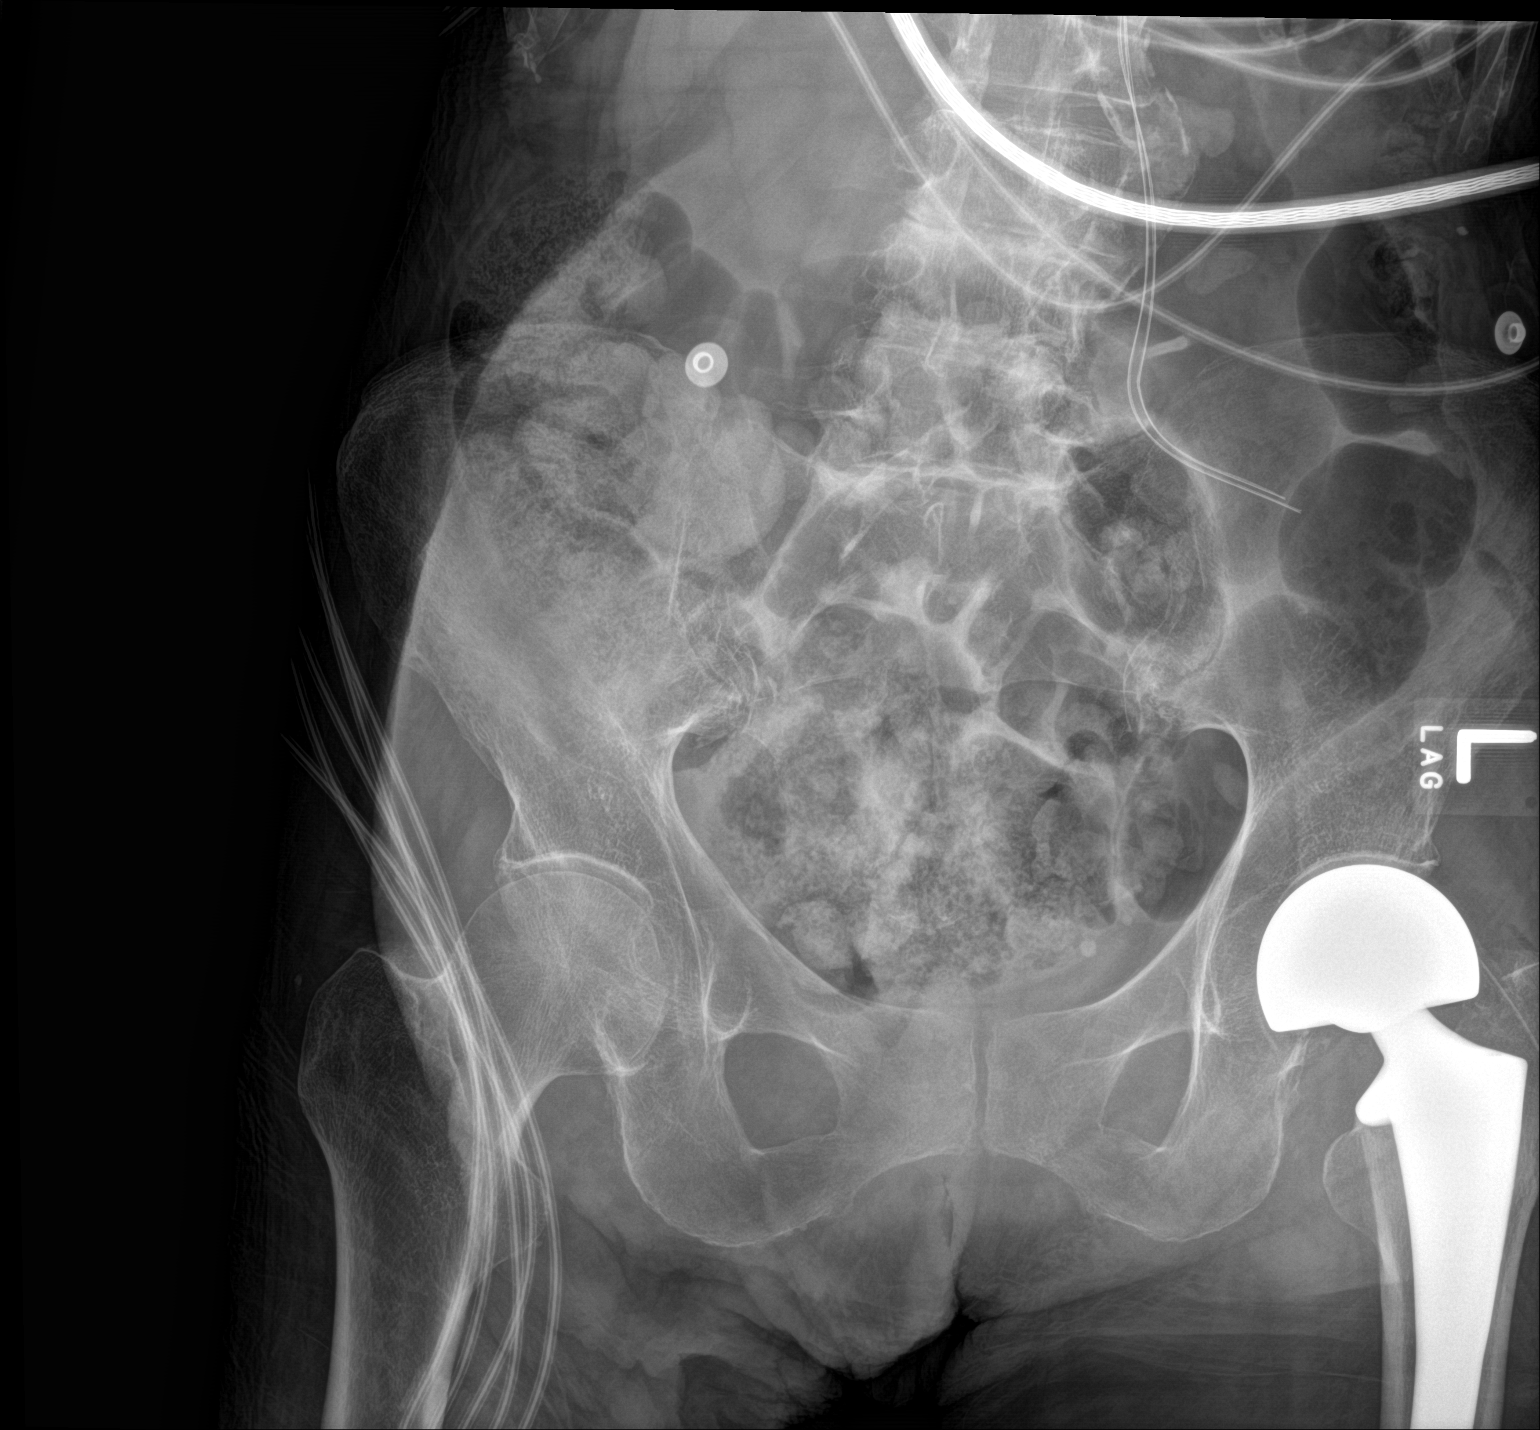
[im 2/2]
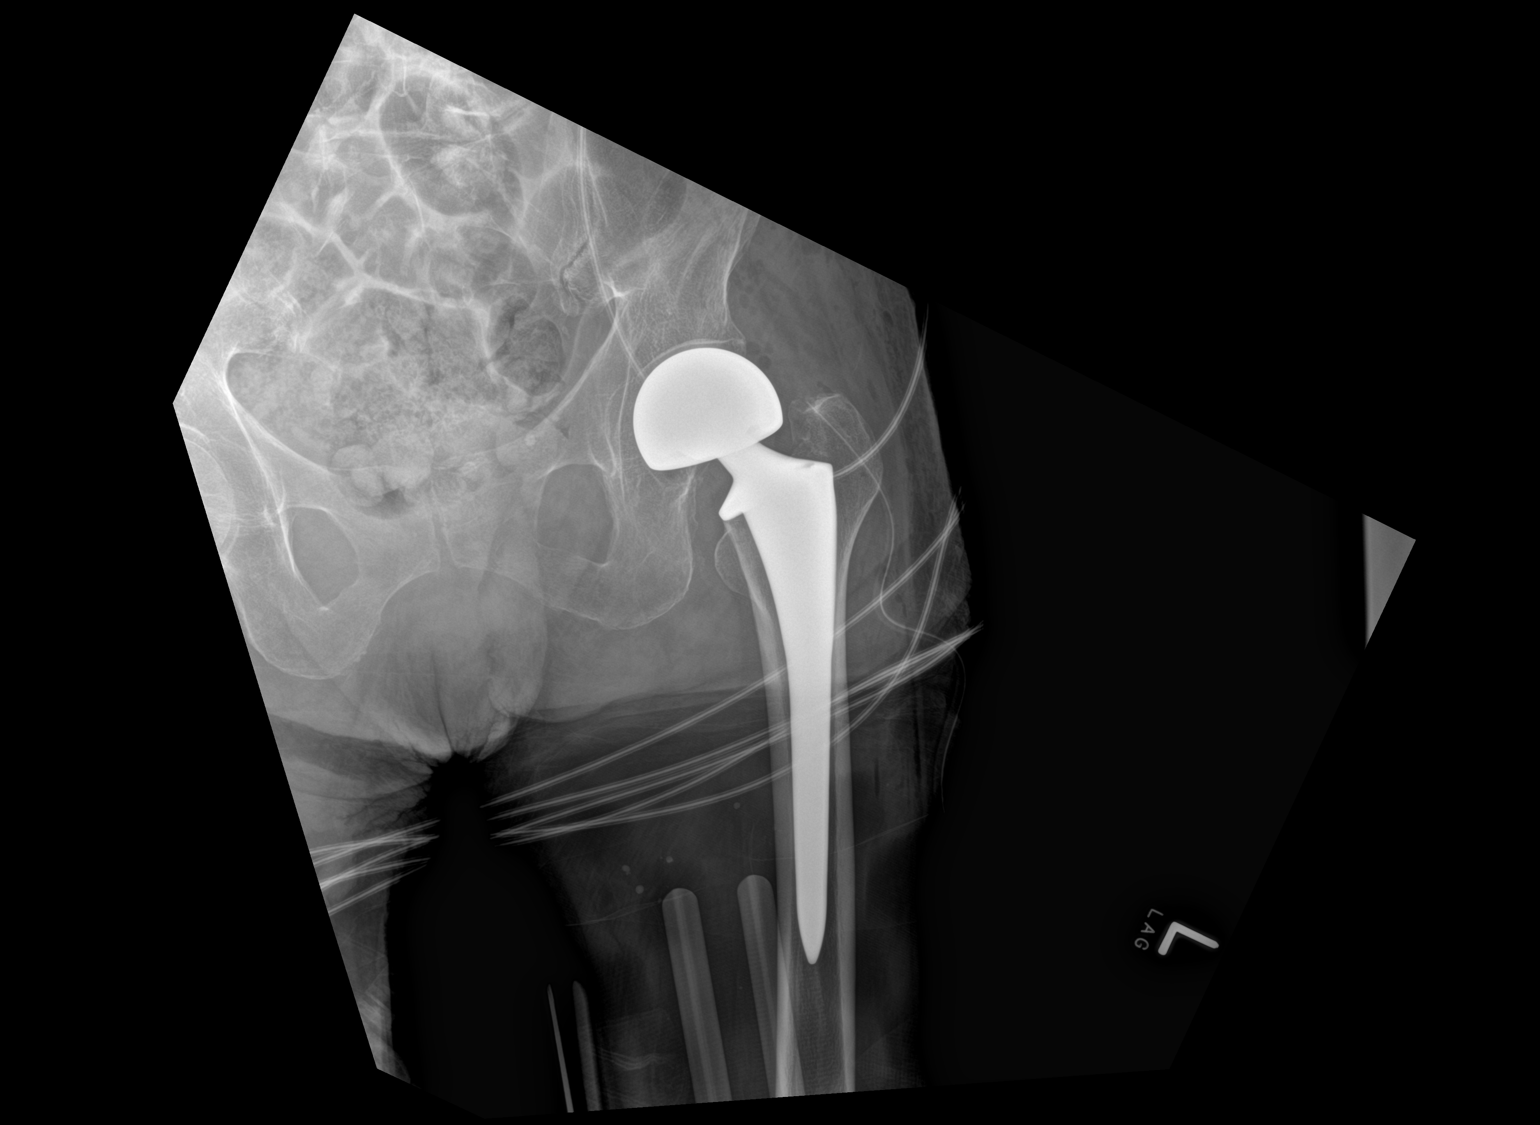

[2 of 2 positions shown; findings below may reference images not displayed]

FINDINGS: Interval left hip replacement with normal alignment. Gas in the soft
tissues of the lateral hip. Surgical drain is present.
IMPRESSION: Interval left hip replacement with expected postsurgical changes

## 2019-03-04 ENCOUNTER — Emergency Department (HOSPITAL_COMMUNITY)
Admission: EM | Admit: 2019-03-04 | Discharge: 2019-03-05 | Disposition: A | Discharge status: Skilled Nursing Facility | Payer: Medicare Other | Attending: Emergency Medicine | Admitting: Emergency Medicine

## 2019-03-04 ENCOUNTER — Emergency Department (HOSPITAL_COMMUNITY): Payer: Medicare Other

## 2019-03-04 ENCOUNTER — Other Ambulatory Visit: Payer: Self-pay

## 2019-03-04 DIAGNOSIS — Z87891 Personal history of nicotine dependence: Secondary | ICD-10-CM | POA: Insufficient documentation

## 2019-03-04 DIAGNOSIS — Z23 Encounter for immunization: Secondary | ICD-10-CM | POA: Insufficient documentation

## 2019-03-04 DIAGNOSIS — R4689 Other symptoms and signs involving appearance and behavior: Secondary | ICD-10-CM | POA: Insufficient documentation

## 2019-03-04 DIAGNOSIS — Z8659 Personal history of other mental and behavioral disorders: Secondary | ICD-10-CM

## 2019-03-04 DIAGNOSIS — J449 Chronic obstructive pulmonary disease, unspecified: Secondary | ICD-10-CM | POA: Insufficient documentation

## 2019-03-04 DIAGNOSIS — I2510 Atherosclerotic heart disease of native coronary artery without angina pectoris: Secondary | ICD-10-CM | POA: Insufficient documentation

## 2019-03-04 DIAGNOSIS — Z7982 Long term (current) use of aspirin: Secondary | ICD-10-CM | POA: Insufficient documentation

## 2019-03-04 DIAGNOSIS — I10 Essential (primary) hypertension: Secondary | ICD-10-CM | POA: Diagnosis not present

## 2019-03-04 DIAGNOSIS — Z79899 Other long term (current) drug therapy: Secondary | ICD-10-CM | POA: Insufficient documentation

## 2019-03-04 DIAGNOSIS — R4182 Altered mental status, unspecified: Secondary | ICD-10-CM | POA: Diagnosis present

## 2019-03-04 DIAGNOSIS — F0390 Unspecified dementia without behavioral disturbance: Secondary | ICD-10-CM | POA: Diagnosis not present

## 2019-03-04 LAB — COMPREHENSIVE METABOLIC PANEL
ALT: 22 U/L (ref 0–44)
AST: 27 U/L (ref 15–41)
Albumin: 3.7 g/dL (ref 3.5–5.0)
Alkaline Phosphatase: 76 U/L (ref 38–126)
Anion gap: 8 (ref 5–15)
BUN: 21 mg/dL (ref 8–23)
CO2: 27 mmol/L (ref 22–32)
Calcium: 8.8 mg/dL — ABNORMAL LOW (ref 8.9–10.3)
Chloride: 103 mmol/L (ref 98–111)
Creatinine, Ser: 0.9 mg/dL (ref 0.44–1.00)
GFR calc Af Amer: >60 mL/min (ref >60)
GFR calc non Af Amer: 57 mL/min — ABNORMAL LOW (ref >60)
Glucose, Bld: 101 mg/dL — ABNORMAL HIGH (ref 70–99)
Potassium: 3.9 mmol/L (ref 3.5–5.1)
Sodium: 138 mmol/L (ref 135–145)
Total Bilirubin: 0.6 mg/dL (ref 0.3–1.2)
Total Protein: 6.7 g/dL (ref 6.5–8.1)

## 2019-03-04 LAB — URINALYSIS, ROUTINE W REFLEX MICROSCOPIC
Bacteria, UA: NONE SEEN
Bilirubin Urine: NEGATIVE
Glucose, UA: NEGATIVE mg/dL
Hgb urine dipstick: NEGATIVE
Ketones, ur: NEGATIVE mg/dL
Leukocytes,Ua: NEGATIVE
Nitrite: NEGATIVE
Protein, ur: NEGATIVE mg/dL
Specific Gravity, Urine: 1.016 (ref 1.005–1.030)
pH: 7 (ref 5.0–8.0)

## 2019-03-04 LAB — CBC WITH DIFFERENTIAL/PLATELET
Abs Immature Granulocytes: 0.02 10*3/uL (ref 0.00–0.07)
Basophils Absolute: 0 10*3/uL (ref 0.0–0.1)
Basophils Relative: 0 %
Eosinophils Absolute: 0.2 10*3/uL (ref 0.0–0.5)
Eosinophils Relative: 2 %
HCT: 37.6 % (ref 36.0–46.0)
Hemoglobin: 11.5 g/dL — ABNORMAL LOW (ref 12.0–15.0)
Immature Granulocytes: 0 %
Lymphocytes Relative: 11 %
Lymphs Abs: 0.8 10*3/uL (ref 0.7–4.0)
MCH: 30.2 pg (ref 26.0–34.0)
MCHC: 30.6 g/dL (ref 30.0–36.0)
MCV: 98.7 fL (ref 80.0–100.0)
Monocytes Absolute: 0.7 10*3/uL (ref 0.1–1.0)
Monocytes Relative: 9 %
Neutro Abs: 5.8 10*3/uL (ref 1.7–7.7)
Neutrophils Relative %: 78 %
Platelets: 204 10*3/uL (ref 150–400)
RBC: 3.81 MIL/uL — ABNORMAL LOW (ref 3.87–5.11)
RDW: 13.4 % (ref 11.5–15.5)
WBC: 7.5 10*3/uL (ref 4.0–10.5)
nRBC: 0 % (ref 0.0–0.2)

## 2019-03-04 MED ORDER — LORAZEPAM 2 MG/ML IJ SOLN: 0.5000 mg | Freq: Once | INTRAMUSCULAR | Status: DC

## 2019-03-04 MED ORDER — TETANUS-DIPHTH-ACELL PERTUSSIS 5-2.5-18.5 LF-MCG/0.5 IM SUSP
0.5000 mL | Freq: Once | INTRAMUSCULAR | Status: AC
  Administered 2019-03-04: 21:00:00 0.5 mL via INTRAMUSCULAR

## 2019-03-04 NOTE — ED Notes (Signed)
[facility] called  

## 2019-03-04 NOTE — ED Notes (Signed)
RN attempted to call report to nurse at [facility], was put on hold repeatedly.

## 2019-03-04 NOTE — ED Notes (Signed)
Pt informed RN that she would "bite you" if we put a gown on her. Cortni Courture, PA aware.

## 2019-03-04 NOTE — ED Notes (Signed)
Bed: GE72 Expected date:  Expected time:  Means of arrival:  Comments: EMS dementia/combative

## 2019-03-04 NOTE — ED Provider Notes (Signed)
[facility] DEPT Provider Note   CSN: [id number] Arrival date & time: 03/04/19  1818    History   Chief Complaint Chief Complaint  Patient presents with   Altered Mental Status   Aggressive Behavior    HPI [name] is a 85 y.o. female.     HPI   Patient is an 85 year old female with a history of CHF, COPD, hypertension, WPW, dementia, who presents emergency department today via EMS from [facility] skilled nursing facility for evaluation of aggressive behavior.  In route patient was combative with EMS and was biting and punching them.  EMS gave 5 of Haldol and 2.5 of Versed IM.  On my evaluation, patient is sedated and therefore unable to participate in the exam.  Level 5 caveat secondary to this.  9:19 PM Discussed case with April, nursing staff at [facility]. She states that since this morning the patient has been combative and aggressive.  She was taking blankets off of other patients, and becoming aggressive with other patients and staff.  She bit several other people and also bit her own arm.  Staff states that patient does have frequent history of combative behavior and has episodes of aggression about once per week.  She has otherwise been in her normal state of health until today.    Past Medical History:  Diagnosis Date   Abnormal cardiovascular stress test 01/17/2013   Decreased LV function with mild distal anterior perfusion abnormality    Acute combined systolic and diastolic heart failure ([place]) 01/17/2013   Chronic combined systolic and diastolic CHF (congestive heart failure) ([place]) 08/17/2017   COPD (chronic obstructive pulmonary disease) ([place])    Hypertension    Restless leg syndrome    Right lower lobe pneumonia ([place]) 08/17/2017   Sepsis ([place]) 08/17/2017   Tobacco use disorder    WPW (Wolff-Parkinson-White syndrome)     Patient Active Problem List   Diagnosis Date Noted   Influenza due to influenza virus, type B  07/12/2018   Elevated troponin 07/11/2018   COPD with acute exacerbation ([place]) 07/11/2018   Acute encephalopathy 07/11/2018   Prolonged QT interval 07/11/2018   Dementia ([place]) 10/13/2017   At risk for adverse drug event 09/26/2017   Normochromic normocytic anemia 09/26/2017   Restless leg syndrome    Protein-calorie malnutrition, severe 09/22/2017   Closed left hip fracture, initial encounter ([place]) 09/19/2017   Pneumococcal pneumonia ([employer]) 08/26/2017   Hypertension 08/26/2017   COPD (chronic obstructive pulmonary disease) ([place]) 08/26/2017   Psychosis ([place]) 08/26/2017   Coronary artery disease 08/26/2017   Sepsis ([place]) 08/17/2017   Chronic combined systolic and diastolic CHF (congestive heart failure) ([place]) 08/17/2017   Right lower lobe pneumonia ([place]) 08/17/2017   Abnormal cardiovascular stress test 01/17/2013    Class: Acute   Tobacco use disorder    WPW (Wolff-Parkinson-White syndrome)     Past Surgical History:  Procedure Laterality Date   APPENDECTOMY     BACK SURGERY     MULTIPLE   HEMANGIOMA EXCISION     HIP ARTHROPLASTY Left 09/20/2017   Procedure: ARTHROPLASTY BIPOLAR HIP (HEMIARTHROPLASTY) LEFT;  Surgeon: Gaynelle Arabian, MD;  Location: [facility] ORS;  Service: Orthopedics;  Laterality: Left;   KIDNEY SURGERY     RT KIDNEY TACK   NECK SURGERY     TONSILLECTOMY       OB History   No obstetric history on file.      Home Medications    Prior to Admission medications  Medication Sig Start Date End Date Taking? Authorizing Provider  ACIDOPHILUS LACTOBACILLUS PO Take 1 tablet by mouth every Monday, Wednesday, and Friday.   Yes [provider]  ALPRAZolam (XANAX) 0.25 MG tablet Take 0.25 mg by mouth as directed. Take 1 tablet BID and Take 1 tablet every 6 hours PRN for agitation.   Yes [provider]  aspirin EC 81 MG tablet Take 81 mg by mouth daily.   Yes [provider]  B Complex Vitamins (B COMPLEX-B12) TABS  Take 1 tablet by mouth daily.   Yes [provider]  cholecalciferol (VITAMIN D3) 25 MCG (1000 UT) tablet Take 1,000 Units by mouth daily.   Yes [provider]  fentaNYL 37.5 MCG/HR PT72 Place 1 patch onto the skin every 3 (three) days.  02/20/19  Yes [provider]  ferrous sulfate 325 (65 FE) MG tablet Take 325 mg by mouth every Monday, Wednesday, and Friday.    Yes [provider]  fluticasone furoate-vilanterol (BREO ELLIPTA) 200-25 MCG/INH AEPB Inhale 1 puff into the lungs at bedtime.   Yes [provider]  ipratropium-albuterol (DUONEB) 0.5-2.5 (3) MG/3ML SOLN Take 3 mLs by nebulization every 6 (six) hours as needed (shortness of breath).    Yes [provider]  isosorbide mononitrate (IMDUR) 30 MG 24 hr tablet Take 30 mg by mouth daily.   Yes [provider]  loratadine (CLARITIN) 10 MG tablet Take 10 mg by mouth daily.   Yes [provider]  losartan (COZAAR) 50 MG tablet Take 50 mg by mouth daily.   Yes [provider]  Magnesium 250 MG TABS Take 1 tablet by mouth daily.   Yes [provider]  Melatonin 3 MG TABS Take 1 tablet by mouth at bedtime.   Yes [provider]  pantoprazole (PROTONIX) 40 MG tablet Take 40 mg by mouth 2 (two) times daily.   Yes [provider]  polyethylene glycol (MIRALAX / GLYCOLAX) packet Take 17 g by mouth daily. 09/22/17  Yes Albertine GratesXu, Fang, MD  pramipexole (MIRAPEX) 0.5 MG tablet Take 0.5 mg by mouth daily as needed (restless legs).    Yes [provider]  pramipexole (MIRAPEX) 1 MG tablet Take 1 mg by mouth at bedtime.    Yes [provider]  QUEtiapine (SEROQUEL) 100 MG tablet Take 1 tablet (100 mg total) by mouth at bedtime. Only gives 100 mg four times a week Patient taking differently: Take 50-100 mg by mouth See admin instructions. Take 50 mg by mouth in the morning and 100 mg by mouth at bedtime 08/21/17  Yes Alison Murrayevine, Alma M, MD    sennosides-docusate sodium (SENOKOT-S) 8.6-50 MG tablet Take 1 tablet by mouth 2 (two) times daily.    Yes [provider]  sertraline (ZOLOFT) 25 MG tablet Take 1 tablet (25 mg total) by mouth at bedtime. Patient taking differently: Take 50 mg by mouth daily.  08/21/17  Yes Alison Murrayevine, Alma M, MD  traMADol (ULTRAM) 50 MG tablet Take 50 mg by mouth every 6 (six) hours as needed for moderate pain.    Yes [provider]  acetaminophen (TYLENOL) 500 MG tablet Take 500 mg by mouth every 6 (six) hours as needed for moderate pain.    [provider]  albuterol (PROVENTIL HFA;VENTOLIN HFA) 108 (90 Base) MCG/ACT inhaler Inhale 2 puffs into the lungs every 6 (six) hours as needed for wheezing or shortness of breath.    [provider]  famotidine (PEPCID) 10 MG  tablet Take 10 mg by mouth every 12 (twelve) hours as needed for heartburn or indigestion.    [provider]  fentaNYL (DURAGESIC - DOSED MCG/HR) 25 MCG/HR patch Place 1 patch (25 mcg total) onto the skin every 3 (three) days. Patient not taking: Reported on 03/04/2019 09/23/17   Albertine GratesXu, Fang, MD  guaiFENesin (MUCINEX) 600 MG 12 hr tablet Take 600 mg by mouth every 12 (twelve) hours as needed for cough or to loosen phlegm.    [provider]  methocarbamol (ROBAXIN) 500 MG tablet Take 0.5 tablets (250 mg total) by mouth at bedtime as needed. Patient taking differently: Take 250 mg by mouth at bedtime as needed for muscle spasms.  09/22/17   Perkins, Alexzandrew L, PA-C  nitroGLYCERIN (NITROSTAT) 0.4 MG SL tablet Place 1 tablet (0.4 mg total) under the tongue every 5 (five) minutes x 3 doses as needed for chest pain. 12/22/12   Corky CraftsVaranasi, Jayadeep S, MD  ondansetron (ZOFRAN) 4 MG tablet Take 4 mg by mouth every 8 (eight) hours as needed for nausea or vomiting.    [provider]  OXYGEN Inhale 1 L into the lungs as needed (oxygen saturation 87% or below).     [provider]    Family  History Family History  Problem Relation Age of Onset   Stomach cancer Mother    CAD Father    Diabetes Father    Hypertension Father    Hypertension Daughter    Stroke Neg Hx     Social History Social History   Tobacco Use   Smoking status: Former Smoker   Smokeless tobacco: Never Used  Substance Use Topics   Alcohol use: No   Drug use: No     Allergies   Amoxicillin, Ciprofloxacin, Lorabid [loracarbef], Avelox [moxifloxacin], Erythromycin, Inderal [propranolol], and Chantix [varenicline]   Review of Systems Review of Systems  Unable to perform ROS: Dementia     Physical Exam Updated Vital Signs BP 111/67    Pulse 68    Temp 98.1 F (36.7 C) (Oral)    Resp 18    SpO2 99%   Physical Exam Vitals signs and nursing note reviewed.  Constitutional:      General: She is not in acute distress.    Appearance: She is well-developed.  HENT:     Head: Normocephalic and atraumatic.  Eyes:     Conjunctiva/sclera: Conjunctivae normal.  Neck:     Musculoskeletal: Neck supple.  Cardiovascular:     Rate and Rhythm: Normal rate and regular rhythm.     Heart sounds: No murmur.  Pulmonary:     Effort: Pulmonary effort is normal. No respiratory distress.     Breath sounds: Normal breath sounds.  Abdominal:     Palpations: Abdomen is soft.     Tenderness: There is no abdominal tenderness.  Musculoskeletal:     Comments: Wound seems in place to bilateral arms.  Superficial skin tear to the right forearm.  Superficial skin tears to the bilateral forearms.  No deep wounds noted.  No bony tenderness to the bilateral upper extremities.  Skin:    General: Skin is warm and dry.  Neurological:     Mental Status: She is alert.     Comments: Mental Status:  Sleepy but arousable to voice. Speech fluent without evidence of aphasia. Able to follow 2 step commands without difficulty.  Cranial Nerves:  II: pupils equal, round, reactive to light III,IV, VI: ptosis not present,  extra-ocular motions intact  bilaterally  V,VII: smile symmetric, facial light touch sensation equal VIII: hearing grossly normal to voice  X: uvula elevates symmetrically  XI: bilateral shoulder shrug symmetric and strong XII: midline tongue extension without fassiculations Motor:  Normal tone. 5/5 strength of BUE and BLE major muscle groups including strong and equal grip strength and dorsiflexion/plantar flexion Sensory: light touch normal in all extremities..       ED Treatments / Results  Labs (all labs ordered are listed, but only abnormal results are displayed) Labs Reviewed  CBC WITH DIFFERENTIAL/PLATELET - Abnormal; Notable for the following components:      Result Value   RBC 3.81 (*)    Hemoglobin 11.5 (*)    All other components within normal limits  COMPREHENSIVE METABOLIC PANEL - Abnormal; Notable for the following components:   Glucose, Bld 101 (*)    Calcium 8.8 (*)    GFR calc non Af Amer 57 (*)    All other components within normal limits  URINALYSIS, ROUTINE W REFLEX MICROSCOPIC - Abnormal; Notable for the following components:   Color, Urine AMBER (*)    APPearance HAZY (*)    All other components within normal limits  URINE CULTURE    EKG None  Radiology Ct Head Wo Contrast  Result Date: 03/04/2019 CLINICAL DATA:  Altered mental status.  Dementia. EXAM: CT HEAD WITHOUT CONTRAST TECHNIQUE: Contiguous axial images were obtained from the base of the skull through the vertex without intravenous contrast. COMPARISON:  August 17, 2017 and September 19, 2017 FINDINGS: Brain: There is mild diffuse atrophy. There is no intracranial mass, hemorrhage, extra-axial fluid collection, or midline shift. Patchy small vessel disease in the centra semiovale bilaterally is stable. Prior small lacunar infarcts are noted in each internal capsule anteriorly. No acute infarct evident. Vascular: No hyperdense vessel. There is calcification in the left carotid siphon region. Skull:  The bony calvarium appears intact. Sinuses/Orbits: There is mucosal thickening in the posterior left maxillary antrum. There is mucosal thickening in several ethmoid air cells bilaterally. Orbits appear symmetric bilaterally. Other: Mastoid air cells are clear. IMPRESSION: Atrophy with patchy periventricular small vessel disease. Small prior lacunar infarcts in each internal capsule. No acute infarct evident. No mass or hemorrhage. There are foci of arterial vascular calcification. There are scattered foci of paranasal sinus disease. Electronically Signed   By: Bretta Bang III M.D.   On: 03/04/2019 20:11   Dg Chest Portable 1 View  Result Date: 03/04/2019 CLINICAL DATA:  Altered mental status. EXAM: PORTABLE CHEST 1 VIEW COMPARISON:  07/11/2018 FINDINGS: Enlarged cardiac silhouette. Calcific atherosclerotic disease and tortuosity of the aorta. There is no evidence of focal airspace consolidation, pleural effusion or pneumothorax. Changes of COPD. Spinal stimulator in stable position. Cervical stabilization hardware and right humeral head tendon anchor also stable. Soft tissues are otherwise grossly normal. IMPRESSION: 1. Enlarged cardiac silhouette. 2. COPD. 3. Lungs otherwise clear. Electronically Signed   By: Ted Mcalpine M.D.   On: 03/04/2019 19:51    Procedures Procedures (including critical care time)  Medications Ordered in ED Medications  LORazepam (ATIVAN) injection 0.5 mg (0.5 mg Intramuscular Not Given 03/04/19 2042)  Tdap (BOOSTRIX) injection 0.5 mL (0.5 mLs Intramuscular Given 03/04/19 2041)     Initial Impression / Assessment and Plan / ED Course  I have reviewed the triage vital signs and the nursing notes.  Pertinent labs & imaging results that were available during my care of the patient were reviewed by me and considered in my medical decision  making (see chart for details).  Final Clinical Impressions(s) / ED Diagnoses   Final diagnoses:  Aggressive behavior    History of dementia   85 year old female with history of dementia presenting for evaluation of aggressive behavior.  Reportedly became aggressive with staff prior to arrival and was biting and punching EMS as well.  Patient bit her own arm prior to arrival.  She has been in her normal state of health otherwise.  Staff at nursing facility states that patient has frequent history of combative behavior and that this happens about once per week.  Her CBC shows a mild anemia but it is at baseline for her.  CMP with normal electrolytes and kidney function.  Normal liver function.  Urinalysis does not show any evidence of urinary tract infection.  Will send urine culture.  Chest x-ray without evidence of pneumonia.  Chronic changes from COPD and cardiomegaly present but stable.  CT of the head with Atrophy with patchy periventricular small vessel disease. Small prior lacunar infarcts in each internal capsule. No acute infarct evident. No mass or hemorrhage. There are foci of arterial vascular calcification. There are scattered foci of paranasal sinus disease.  Pt has remained stable throughout her stay.  Her work-up is reassuring, she has a history of similar episodes.  I do not feel she has any acute life-threatening illnesses that would require further work-up or admission. With regard to forearm wounds, these are superficial and abx are felt to be of more risk than benefit. Wound care information provided. Pt will be discharged back to facility.   Pt was seen in conjunction with Dr. Rodena MedinMessick who personally evaluated the pt and is in agreement with the plan.   ED Discharge Orders    None       Rayne DuCouture, Iseah Plouff S, PA-C 03/04/19 2218    Wynetta FinesMessick, Peter C, MD 03/10/19 1108

## 2019-03-04 NOTE — ED Triage Notes (Signed)
Pt BIB EMS from [facility] nursing facility. Pt has dementia and is ambulatory. Facility denies fall. Per facility pt has altered mental status and has been combative for the last few hours. Pt was combative with EMS, biting and punching. Pt has bit herself. EMS gave 5 mg Haldol IM and 2.5 mg Versed IM. Pt refused VS with EMS.

## 2019-03-04 NOTE — Discharge Instructions (Addendum)
The patient's work-up today was reassuring.  Her labs were stable.  Her urinalysis did not show any evidence of urinary tract infection.  A culture was sent of her urine.  If it does grow any bacteria and she requires antibiotics, the hospital will contact the facility and let you know.  The patient had a CT scan of her head which did not show any acute changes in her brain.  She also had a chest x-ray which did not show any acute changes in her lungs.  Please follow up with your primary care provider within 5-7 days for re-evaluation of your symptoms. If you do not have a primary care provider, information for a healthcare clinic has been provided for you to make arrangements for follow up care. Please return to the emergency department for any new or worsening symptoms.

## 2019-03-05 LAB — URINE CULTURE: Culture: NO GROWTH

## 2019-03-05 NOTE — ED Notes (Signed)
Spoke with daughter, Arbie Cookey, who will be coming by to pick up pt's belongings.

## 2019-03-18 ENCOUNTER — Emergency Department (HOSPITAL_COMMUNITY)
Admission: EM | Admit: 2019-03-18 | Discharge: 2019-03-19 | Disposition: A | Discharge status: Home / Self Care | Payer: Medicare Other | Attending: Emergency Medicine | Admitting: Emergency Medicine

## 2019-03-18 ENCOUNTER — Other Ambulatory Visit: Payer: Self-pay

## 2019-03-18 ENCOUNTER — Encounter (HOSPITAL_COMMUNITY): Payer: Self-pay | Admitting: *Deleted

## 2019-03-18 ENCOUNTER — Emergency Department (HOSPITAL_COMMUNITY): Payer: Medicare Other

## 2019-03-18 DIAGNOSIS — Z79899 Other long term (current) drug therapy: Secondary | ICD-10-CM | POA: Insufficient documentation

## 2019-03-18 DIAGNOSIS — I2510 Atherosclerotic heart disease of native coronary artery without angina pectoris: Secondary | ICD-10-CM | POA: Insufficient documentation

## 2019-03-18 DIAGNOSIS — Z7982 Long term (current) use of aspirin: Secondary | ICD-10-CM | POA: Insufficient documentation

## 2019-03-18 DIAGNOSIS — I5042 Chronic combined systolic (congestive) and diastolic (congestive) heart failure: Secondary | ICD-10-CM | POA: Insufficient documentation

## 2019-03-18 DIAGNOSIS — J449 Chronic obstructive pulmonary disease, unspecified: Secondary | ICD-10-CM | POA: Insufficient documentation

## 2019-03-18 DIAGNOSIS — R079 Chest pain, unspecified: Secondary | ICD-10-CM | POA: Diagnosis present

## 2019-03-18 DIAGNOSIS — Z87891 Personal history of nicotine dependence: Secondary | ICD-10-CM | POA: Diagnosis not present

## 2019-03-18 DIAGNOSIS — I110 Hypertensive heart disease with heart failure: Secondary | ICD-10-CM | POA: Diagnosis not present

## 2019-03-18 DIAGNOSIS — F0390 Unspecified dementia without behavioral disturbance: Secondary | ICD-10-CM | POA: Insufficient documentation

## 2019-03-18 DIAGNOSIS — R410 Disorientation, unspecified: Secondary | ICD-10-CM | POA: Diagnosis not present

## 2019-03-18 LAB — CBC
HCT: 38.6 % (ref 36.0–46.0)
Hemoglobin: 11.8 g/dL — ABNORMAL LOW (ref 12.0–15.0)
MCH: 30.1 pg (ref 26.0–34.0)
MCHC: 30.6 g/dL (ref 30.0–36.0)
MCV: 98.5 fL (ref 80.0–100.0)
Platelets: 329 10*3/uL (ref 150–400)
RBC: 3.92 MIL/uL (ref 3.87–5.11)
RDW: 13.2 % (ref 11.5–15.5)
WBC: 6.3 10*3/uL (ref 4.0–10.5)
nRBC: 0 % (ref 0.0–0.2)

## 2019-03-18 LAB — BASIC METABOLIC PANEL
Anion gap: 9 (ref 5–15)
BUN: 11 mg/dL (ref 8–23)
CO2: 26 mmol/L (ref 22–32)
Calcium: 8.8 mg/dL — ABNORMAL LOW (ref 8.9–10.3)
Chloride: 104 mmol/L (ref 98–111)
Creatinine, Ser: 0.81 mg/dL (ref 0.44–1.00)
GFR calc Af Amer: >60 mL/min (ref >60)
GFR calc non Af Amer: >60 mL/min (ref >60)
Glucose, Bld: 103 mg/dL — ABNORMAL HIGH (ref 70–99)
Potassium: 4 mmol/L (ref 3.5–5.1)
Sodium: 139 mmol/L (ref 135–145)

## 2019-03-18 LAB — TROPONIN I (HIGH SENSITIVITY)
Troponin I (High Sensitivity): 12 ng/L (ref <18)
Troponin I (High Sensitivity): 12 ng/L (ref <18)

## 2019-03-18 MED ORDER — ASPIRIN 81 MG PO CHEW
324.0000 mg | CHEWABLE_TABLET | Freq: Once | ORAL | Status: DC
  Filled 2019-03-18: qty 4

## 2019-03-18 MED ORDER — SODIUM CHLORIDE 0.9% FLUSH: 3.0000 mL | Freq: Once | INTRAVENOUS | Status: DC

## 2019-03-18 NOTE — ED Notes (Signed)
ED Provider at bedside. 

## 2019-03-18 NOTE — ED Provider Notes (Signed)
[facility] EMERGENCY DEPARTMENT Provider Note   CSN: [id number] Arrival date & time: 03/18/19  1954    History   Chief Complaint Chief Complaint  Patient presents with  . Chest Pain    HPI [name] is a 85 y.o. female.     HPI   Patient is an 85 year old female with a history of CHF, COPD, hypertension, WPW, dementia, who presents to the emergency department for evaluation of chest pain and confusion.  Patient is demented therefore there is a level 5 caveat.  She states she thinks she is in the emergency department because people at her facility are concerned about her heart.  She states that she was having chest pressure intermittently to the upper chest bilaterally.  She has no shortness of breath.  She has had no abdominal pain.  She has had no cough or fevers.  11:07 AM Discussed case with med tech at [facility].  She states that the patient was found curled up in the corner of her room holding her left chest/left arm.  The patient is normally verbal and at this time she would not communicate with them and seemed somewhat confused.  They asked her to stand up and noticed that she was limping.  She is unaware of any chest pain or syncope that happened.  She is also unaware of any falls that occurred.  11:09 AM I also discussed case with Artist Pais, who is an Warden/ranger at [employer].  She states that she did not witness this episode but heard from other staff that the main concern about the patient was that she was not responding appropriately and seemed confused.  She is also unaware of the patient having a syncopal episode or having chest pressure.  She states that the patient was holding her right hand complaining of pain.  She states that this is chronic for the patient and that the patient has nerve damage to the right upper extremity and frequently has pain to the right hand.  She also states that the  patient seemed to be limping on her right side.  They are unaware of any falls.  Past Medical History:  Diagnosis Date  . Abnormal cardiovascular stress test 01/17/2013   Decreased LV function with mild distal anterior perfusion abnormality   . Acute combined systolic and diastolic heart failure (HCC) 01/17/2013  . Chronic combined systolic and diastolic CHF (congestive heart failure) (HCC) 08/17/2017  . COPD (chronic obstructive pulmonary disease) (HCC)   . Hypertension   . Restless leg syndrome   . Right lower lobe pneumonia (HCC) 08/17/2017  . Sepsis (HCC) 08/17/2017  . Tobacco use disorder   . WPW (Wolff-Parkinson-White syndrome)     Patient Active Problem List   Diagnosis Date Noted  . Influenza due to influenza virus, type B 07/12/2018  . Elevated troponin 07/11/2018  . COPD with acute exacerbation (HCC) 07/11/2018  . Acute encephalopathy 07/11/2018  . Prolonged QT interval 07/11/2018  . Dementia (HCC) 10/13/2017  . At risk for adverse drug event 09/26/2017  . Normochromic normocytic anemia 09/26/2017  . Restless leg syndrome   . Protein-calorie malnutrition, severe 09/22/2017  . Closed left hip fracture, initial encounter (HCC) 09/19/2017  . Pneumococcal pneumonia (HCC) 08/26/2017  . Hypertension 08/26/2017  . COPD (chronic obstructive pulmonary disease) (HCC) 08/26/2017  . Psychosis (HCC) 08/26/2017  . Coronary artery disease 08/26/2017  . Sepsis (HCC) 08/17/2017  . Chronic combined systolic and diastolic CHF (  congestive heart failure) (HCC) 08/17/2017  . Right lower lobe pneumonia (HCC) 08/17/2017  . Abnormal cardiovascular stress test 01/17/2013    Class: Acute  . Tobacco use disorder   . WPW (Wolff-Parkinson-White syndrome)     Past Surgical History:  Procedure Laterality Date  . APPENDECTOMY    . BACK SURGERY     MULTIPLE  . HEMANGIOMA EXCISION    . HIP ARTHROPLASTY Left 09/20/2017   Procedure: ARTHROPLASTY BIPOLAR HIP (HEMIARTHROPLASTY) LEFT;  Surgeon:  Ollen GrossAluisio, Frank, MD;  Location: [facility];  Service: Orthopedics;  Laterality: Left;  . KIDNEY SURGERY     RT KIDNEY TACK  . NECK SURGERY    . TONSILLECTOMY       OB History   No obstetric history on file.      Home Medications    Prior to Admission medications   Medication Sig Start Date End Date Taking? Authorizing Provider  acetaminophen (TYLENOL) 500 MG tablet Take 500 mg by mouth every 6 (six) hours as needed for moderate pain.    [provider]  ACIDOPHILUS LACTOBACILLUS PO Take 1 tablet by mouth every Monday, Wednesday, and Friday.    [provider]  albuterol (PROVENTIL HFA;VENTOLIN HFA) 108 (90 Base) MCG/ACT inhaler Inhale 2 puffs into the lungs every 6 (six) hours as needed for wheezing or shortness of breath.    [provider]  ALPRAZolam Prudy Feeler(XANAX) 0.25 MG tablet Take 0.25 mg by mouth as directed. Take 1 tablet BID and Take 1 tablet every 6 hours PRN for agitation.    [provider]  aspirin EC 81 MG tablet Take 81 mg by mouth daily.    [provider]  B Complex Vitamins (B COMPLEX-B12) TABS Take 1 tablet by mouth daily.    [provider]  cholecalciferol (VITAMIN D3) 25 MCG (1000 UT) tablet Take 1,000 Units by mouth daily.    [provider]  famotidine (PEPCID) 10 MG tablet Take 10 mg by mouth every 12 (twelve) hours as needed for heartburn or indigestion.    [provider]  fentaNYL (DURAGESIC - DOSED MCG/HR) 25 MCG/HR patch Place 1 patch (25 mcg total) onto the skin every 3 (three) days. Patient not taking: Reported on 03/04/2019 09/23/17   Albertine GratesXu, Fang, MD  fentaNYL 37.5 MCG/HR PT72 Place 1 patch onto the skin every 3 (three) days.  02/20/19   [provider]  ferrous sulfate 325 (65 FE) MG tablet Take 325 mg by mouth every Monday, Wednesday, and Friday.     [provider]  fluticasone furoate-vilanterol (BREO ELLIPTA) 200-25 MCG/INH AEPB Inhale 1 puff into the lungs at bedtime.     [provider]  guaiFENesin (MUCINEX) 600 MG 12 hr tablet Take 600 mg by mouth every 12 (twelve) hours as needed for cough or to loosen phlegm.    [provider]  ipratropium-albuterol (DUONEB) 0.5-2.5 (3) MG/3ML SOLN Take 3 mLs by nebulization every 6 (six) hours as needed (shortness of breath).     [provider]  isosorbide mononitrate (IMDUR) 30 MG 24 hr tablet Take 30 mg by mouth daily.    [provider]  loratadine (CLARITIN) 10 MG tablet Take 10 mg by mouth daily.    [provider]  losartan (COZAAR) 50 MG tablet Take 50 mg by mouth daily.    [provider]  Magnesium 250 MG TABS Take 1 tablet by mouth daily.    [provider]  Melatonin 3 MG TABS Take 1 tablet  by mouth at bedtime.    [provider]  methocarbamol (ROBAXIN) 500 MG tablet Take 0.5 tablets (250 mg total) by mouth at bedtime as needed. Patient taking differently: Take 250 mg by mouth at bedtime as needed for muscle spasms.  09/22/17   Perkins, Alexzandrew L, PA-C  nitroGLYCERIN (NITROSTAT) 0.4 MG SL tablet Place 1 tablet (0.4 mg total) under the tongue every 5 (five) minutes x 3 doses as needed for chest pain. 12/22/12   Jettie Booze, MD  ondansetron (ZOFRAN) 4 MG tablet Take 4 mg by mouth every 8 (eight) hours as needed for nausea or vomiting.    [provider]  OXYGEN Inhale 1 L into the lungs as needed (oxygen saturation 87% or below).     [provider]  pantoprazole (PROTONIX) 40 MG tablet Take 40 mg by mouth 2 (two) times daily.    [provider]  polyethylene glycol (MIRALAX / GLYCOLAX) packet Take 17 g by mouth daily. 09/22/17   Florencia Reasons, MD  pramipexole (MIRAPEX) 0.5 MG tablet Take 0.5 mg by mouth daily as needed (restless legs).     [provider]  pramipexole (MIRAPEX) 1 MG tablet Take 1 mg by mouth at bedtime.     [provider]  QUEtiapine (SEROQUEL) 100 MG tablet Take 1 tablet (100  mg total) by mouth at bedtime. Only gives 100 mg four times a week Patient taking differently: Take 50-100 mg by mouth See admin instructions. Take 50 mg by mouth in the morning and 100 mg by mouth at bedtime 08/21/17   Robbie Lis, MD  sennosides-docusate sodium (SENOKOT-S) 8.6-50 MG tablet Take 1 tablet by mouth 2 (two) times daily.     [provider]  sertraline (ZOLOFT) 25 MG tablet Take 1 tablet (25 mg total) by mouth at bedtime. Patient taking differently: Take 50 mg by mouth daily.  08/21/17   Robbie Lis, MD  traMADol (ULTRAM) 50 MG tablet Take 50 mg by mouth every 6 (six) hours as needed for moderate pain.     [provider]    Family History Family History  Problem Relation Age of Onset  . Stomach cancer Mother   . CAD Father   . Diabetes Father   . Hypertension Father   . Hypertension Daughter   . Stroke Neg Hx     Social History Social History   Tobacco Use  . Smoking status: Former Research scientist (life sciences)  . Smokeless tobacco: Never Used  Substance Use Topics  . Alcohol use: No  . Drug use: No     Allergies   Amoxicillin, Ciprofloxacin, Lorabid [loracarbef], Avelox [moxifloxacin], Erythromycin, Inderal [propranolol], and Chantix [varenicline]   Review of Systems Review of Systems  Unable to perform ROS: Dementia    Physical Exam Updated Vital Signs BP (!) 152/56   Pulse 81   Temp 99.5 F (37.5 C) (Oral)   Resp 17   SpO2 98%   Physical Exam Vitals signs and nursing note reviewed.  Constitutional:      General: She is not in acute distress.    Appearance: She is well-developed. She is not ill-appearing or toxic-appearing.  HENT:     Head: Normocephalic and atraumatic.  Eyes:     Conjunctiva/sclera: Conjunctivae normal.  Neck:     Musculoskeletal: Neck supple.  Cardiovascular:     Rate and Rhythm: Normal rate and regular rhythm.     Heart sounds: Normal heart sounds. No murmur.  Pulmonary:  Effort: Pulmonary effort is normal. No  respiratory distress.     Breath sounds: Normal breath sounds. No decreased breath sounds, wheezing, rhonchi or rales.  Abdominal:     General: Bowel sounds are normal.     Palpations: Abdomen is soft.     Tenderness: There is no abdominal tenderness. There is no guarding or rebound.  Musculoskeletal:     Right lower leg: She exhibits no tenderness. No edema.     Left lower leg: She exhibits no tenderness. No edema.  Skin:    General: Skin is warm and dry.  Neurological:     Mental Status: She is alert.     Comments: Mental Status:  Alert, pleasantly demented, oriented to self, but not time or date. Speech fluent without evidence of aphasia. Able to follow 2 step commands without difficulty.  Cranial Nerves:  II:  Peripheral visual fields grossly normal, pupils equal, round, reactive to light III,IV, VI: ptosis not present, extra-ocular motions intact bilaterally  V,VII: smile symmetric, facial light touch sensation equal VIII: hearing grossly normal to voice  X: uvula elevates symmetrically  XI: bilateral shoulder shrug symmetric and strong XII: midline tongue extension without fassiculations Motor:  Normal tone. 5/5 strength of BUE and BLE major muscle groups including strong and equal grip strength and dorsiflexion/plantar flexion Sensory: light touch normal in all extremities. Cerebellar: normal finger-to-nose with bilateral upper extremities Gait: normal gait and balance. CV: 2+ radial and DP pulses Negative pronator drift      ED Treatments / Results  Labs (all labs ordered are listed, but only abnormal results are displayed) Labs Reviewed  BASIC METABOLIC PANEL - Abnormal; Notable for the following components:      Result Value   Glucose, Bld 103 (*)    Calcium 8.8 (*)    All other components within normal limits  CBC - Abnormal; Notable for the following components:   Hemoglobin 11.8 (*)    All other components within normal limits  TROPONIN I (HIGH SENSITIVITY)   TROPONIN I (HIGH SENSITIVITY)    EKG EKG Interpretation  Date/Time:  Monday March 18 2019 20:01:23 EDT Ventricular Rate:  78 PR Interval:  164 QRS Duration: 150 QT Interval:  480 QTC Calculation: 547 R Axis:   -18 Text Interpretation:  Normal sinus rhythm Left bundle branch block Abnormal ECG LBBB has been present in April 2014 Confirmed by Rochele RaringWard, Kristen [phone]) on 03/18/2019 11:21:57 PM   Radiology Dg Chest 2 View  Result Date: 03/18/2019 CLINICAL DATA:  Chest pain. Weakness. Syncope today. EXAM: CHEST - 2 VIEW COMPARISON:  Most recent radiograph 03/04/2019 FINDINGS: Chronic hyperinflation and interstitial coarsening. Mild cardiomegaly with unchanged mediastinal contours and aortic atherosclerosis. Pulmonary vasculature is normal. No consolidation, pleural effusion, or pneumothorax. Age indeterminate compression fracture in the lower thoracic spine, however new from 08/17/2017 chest radiograph. IMPRESSION: 1. Chronic hyperinflation and interstitial coarsening, imaging findings consistent with COPD. No acute findings. 2. Mild cardiomegaly is similar to prior. 3. Age indeterminate lower thoracic compression fracture, new from 08/17/2017, no interval imaging for comparison. Aortic Atherosclerosis (ICD10-I70.0). Electronically Signed   By: Narda RutherfordMelanie  Sanford M.D.   On: 03/18/2019 21:40    Procedures Procedures (including critical care time)  Medications Ordered in ED Medications  LORazepam (ATIVAN) injection 0.5 mg (0.5 mg Intravenous Given by Other 03/19/19 0120)  LORazepam (ATIVAN) injection 0.5 mg (0.5 mg Intravenous Given 03/19/19 0211)     Initial Impression / Assessment and Plan / ED Course  I have reviewed the  triage vital signs and the nursing notes.  Pertinent labs & imaging results that were available during my care of the patient were reviewed by me and considered in my medical decision making (see chart for details).   Final Clinical Impressions(s) / ED Diagnoses   Final  diagnoses:  Confusion   85 year old female presenting for evaluation of an episode of confusion and possible chest pain.  The history is limited secondary to the patient's history of dementia and limited history from the patient's facility.  Pt reportedly may have had an episode of chest pain at her facility. She also had a period of confusion where she was not responding.  On exam, patient is in no acute distress.  She is talkative with clear speech.  She has normal strength of the bilateral upper and lower extremities.  She has no facial droop, cranial nerves are intact.  Labs with mild anemia that is stable. Normal electrolytes. Normal cbg and renal function.  EKG with Normal sinus rhythm, Left bundle branch block, Abnormal ECG, LBBB has been present in April 2014  CXR with chronic hyperinflation and interstitial coarsening, imaging findings consistent with COPD. No acute findings. Mild cardiomegaly is similar to prior. 3. Age indeterminate lower thoracic compression fracture, new from 08/17/2017, no interval imaging for comparison.   11:35 PM Discussed case with the patients daughter, [name], about the patients wants and the family's wants with her medical care.  I discussed the results of her work-up thus far including the left bundle branch block found on her EKG today.  I discussed the possibility of admission for further testing to rule out cardiac cause however the patient's daughter does not want the patient to have any invasive testing.  She states the patient is a DNR and would prefer not to have these tests.  I also discussed that I wanted to obtain a CT scan of the patient's head however the patient is adamantly refusing.  The daughter is comfortable foregoing a CT scan of the head given that the patient's neurologic exam is normal and she is now talkative and back to her baseline.  Patient will be discharged to follow-up with PCP and to return if worse.  Case was discussed  with Dr. Elesa MassedWard, who personally evaluated the patient and is in agreement with this plan.  ED Discharge Orders    None       Rayne DuCouture, Sinai Mahany S, PA-C 03/19/19 [id number]    Ward, Layla MawKristen N, DO 03/19/19 1450

## 2019-03-18 NOTE — Discharge Instructions (Signed)
Please follow up with your primary care provider within 5-7 days for re-evaluation of your symptoms. If you do not have a primary care provider, information for a healthcare clinic has been provided for you to make arrangements for follow up care. Please return to the emergency department for any new or worsening symptoms. ° °

## 2019-03-18 NOTE — ED Notes (Signed)
Patient refused to change into gown despite staff's requests.

## 2019-03-18 NOTE — ED Triage Notes (Signed)
Pt arrived by EMS from [facility] for chest pressure, syncope, and weakness. EMS reports pt had witness syncopal episode last ing ~10 mins with increased confusion (hx of dementia)> pt has been pain free since EMS picked pt up. 18g IV to [facility]

## 2019-03-18 NOTE — ED Notes (Signed)
[place] pts daughter, call when updates are available or if needed

## 2019-03-18 NOTE — ED Provider Notes (Addendum)
Medical screening examination/treatment/procedure(s) were conducted as a shared visit with non-physician practitioner(s) and myself.  I personally evaluated the patient during the encounter.  EKG Interpretation  Date/Time:  Monday March 18 2019 20:01:23 EDT Ventricular Rate:  78 PR Interval:  164 QRS Duration: 150 QT Interval:  480 QTC Calculation: 547 R Axis:   -18 Text Interpretation:  Normal sinus rhythm Left bundle branch block Abnormal ECG LBBB has been present in April 2014 Confirmed by Pryor Curia [phone]) on 03/18/2019 11:21:57 PM   Patient is an 85 year old female with history of WPW, dementia, CHF, COPD who presents to the emergency department as people at the nursing facility were concerned that she was complaining of chest pain.  Nursing home staff reported that they thought she appeared confused compared to her baseline.  Patient here is pleasantly demented but refusing work-up.  She denies any chest pain or shortness of breath.  EKG shows left bundle branch block which she has had previously in 2014.  Her labs are unremarkable including 2 troponins that are negative.  Her chest x-ray shows findings consistent with COPD but no other acute abnormality.  Patient refuses head CT or urinalysis.  We have had lengthy discussion with patient's family who is comfortable with discharging patient back to the nursing facility without extended work-up.  Low suspicion for intracranial hemorrhage or stroke at this time.  She seems to be moving all 4 extremities equally, has normal speech, no facial asymmetry.  No fever.   Ward, Delice Bison, DO 03/18/19 2357   Patient agitated here in the emergency department and requires small dose of IV Ativan to help with agitation.  Has history of the same.  I still think that she is safe to be discharged back to her nursing facility.   Ward, Delice Bison, DO 03/19/19 0129

## 2019-03-19 MED ORDER — LORAZEPAM 2 MG/ML IJ SOLN
0.5000 mg | Freq: Once | INTRAMUSCULAR | Status: AC
  Administered 2019-03-19: 0.5 mg via INTRAVENOUS
  Filled 2019-03-19: qty 1

## 2019-03-19 MED ORDER — LORAZEPAM 2 MG/ML IJ SOLN
0.5000 mg | Freq: Once | INTRAMUSCULAR | Status: AC
  Administered 2019-03-19: 0.5 mg via INTRAVENOUS

## 2019-03-19 MED ORDER — LORAZEPAM 2 MG/ML IJ SOLN: 0.5000 mg | Freq: Once | INTRAMUSCULAR | Status: DC

## 2019-03-19 MED ORDER — LORAZEPAM 2 MG/ML IJ SOLN
INTRAMUSCULAR | Status: AC
  Filled 2019-03-19: qty 1

## 2019-03-19 MED ORDER — LORAZEPAM 2 MG/ML IJ SOLN: 1.0000 mg | Freq: Once | INTRAMUSCULAR | Status: DC

## 2019-03-19 NOTE — ED Notes (Signed)
Pt refused vitals 

## 2019-03-19 NOTE — ED Notes (Signed)
Called ptar 
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
## 2019-03-19 NOTE — ED Notes (Signed)
Patient resting calmly on stretcher, resp e/u, nad.
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
## 2019-03-19 NOTE — ED Notes (Signed)
ptar at bedside, transport papers and d/c papers given to ptar.

## 2019-03-19 NOTE — ED Notes (Signed)
Patient is climbing out of bed again, unable to follow directions, moderately agitated; md notified.

## 2019-04-13 ENCOUNTER — Emergency Department (HOSPITAL_COMMUNITY): Payer: Medicare Other

## 2019-04-13 ENCOUNTER — Encounter (HOSPITAL_COMMUNITY): Payer: Self-pay | Admitting: *Deleted

## 2019-04-13 ENCOUNTER — Other Ambulatory Visit: Payer: Self-pay

## 2019-04-13 ENCOUNTER — Emergency Department (HOSPITAL_COMMUNITY)
Admission: EM | Admit: 2019-04-13 | Discharge: 2019-04-13 | Disposition: A | Discharge status: Home / Self Care | Payer: Medicare Other | Attending: Emergency Medicine | Admitting: Emergency Medicine

## 2019-04-13 DIAGNOSIS — I5042 Chronic combined systolic (congestive) and diastolic (congestive) heart failure: Secondary | ICD-10-CM | POA: Diagnosis not present

## 2019-04-13 DIAGNOSIS — R109 Unspecified abdominal pain: Secondary | ICD-10-CM | POA: Diagnosis present

## 2019-04-13 DIAGNOSIS — Z87891 Personal history of nicotine dependence: Secondary | ICD-10-CM | POA: Diagnosis not present

## 2019-04-13 DIAGNOSIS — Z7982 Long term (current) use of aspirin: Secondary | ICD-10-CM | POA: Diagnosis not present

## 2019-04-13 DIAGNOSIS — Z79899 Other long term (current) drug therapy: Secondary | ICD-10-CM | POA: Diagnosis not present

## 2019-04-13 DIAGNOSIS — I110 Hypertensive heart disease with heart failure: Secondary | ICD-10-CM | POA: Insufficient documentation

## 2019-04-13 DIAGNOSIS — J449 Chronic obstructive pulmonary disease, unspecified: Secondary | ICD-10-CM | POA: Insufficient documentation

## 2019-04-13 DIAGNOSIS — K8020 Calculus of gallbladder without cholecystitis without obstruction: Secondary | ICD-10-CM

## 2019-04-13 LAB — CBC WITH DIFFERENTIAL/PLATELET
Abs Immature Granulocytes: 0.01 10*3/uL (ref 0.00–0.07)
Basophils Absolute: 0 10*3/uL (ref 0.0–0.1)
Basophils Relative: 0 %
Eosinophils Absolute: 0.2 10*3/uL (ref 0.0–0.5)
Eosinophils Relative: 5 %
HCT: 37.5 % (ref 36.0–46.0)
Hemoglobin: 11.7 g/dL — ABNORMAL LOW (ref 12.0–15.0)
Immature Granulocytes: 0 %
Lymphocytes Relative: 17 %
Lymphs Abs: 0.8 10*3/uL (ref 0.7–4.0)
MCH: 30.5 pg (ref 26.0–34.0)
MCHC: 31.2 g/dL (ref 30.0–36.0)
MCV: 97.9 fL (ref 80.0–100.0)
Monocytes Absolute: 0.4 10*3/uL (ref 0.1–1.0)
Monocytes Relative: 9 %
Neutro Abs: 3.1 10*3/uL (ref 1.7–7.7)
Neutrophils Relative %: 69 %
Platelets: 210 10*3/uL (ref 150–400)
RBC: 3.83 MIL/uL — ABNORMAL LOW (ref 3.87–5.11)
RDW: 13.1 % (ref 11.5–15.5)
WBC: 4.5 10*3/uL (ref 4.0–10.5)
nRBC: 0 % (ref 0.0–0.2)

## 2019-04-13 LAB — URINALYSIS, ROUTINE W REFLEX MICROSCOPIC
Bilirubin Urine: NEGATIVE
Glucose, UA: NEGATIVE mg/dL
Hgb urine dipstick: NEGATIVE
Ketones, ur: NEGATIVE mg/dL
Nitrite: NEGATIVE
Protein, ur: NEGATIVE mg/dL
Specific Gravity, Urine: 1.03 (ref 1.005–1.030)
pH: 6 (ref 5.0–8.0)

## 2019-04-13 LAB — COMPREHENSIVE METABOLIC PANEL
ALT: 20 U/L (ref 0–44)
AST: 22 U/L (ref 15–41)
Albumin: 3.6 g/dL (ref 3.5–5.0)
Alkaline Phosphatase: 88 U/L (ref 38–126)
Anion gap: 10 (ref 5–15)
BUN: 19 mg/dL (ref 8–23)
CO2: 25 mmol/L (ref 22–32)
Calcium: 9 mg/dL (ref 8.9–10.3)
Chloride: 107 mmol/L (ref 98–111)
Creatinine, Ser: 1.05 mg/dL — ABNORMAL HIGH (ref 0.44–1.00)
GFR calc Af Amer: 55 mL/min — ABNORMAL LOW (ref >60)
GFR calc non Af Amer: 47 mL/min — ABNORMAL LOW (ref >60)
Glucose, Bld: 87 mg/dL (ref 70–99)
Potassium: 4.3 mmol/L (ref 3.5–5.1)
Sodium: 142 mmol/L (ref 135–145)
Total Bilirubin: 0.4 mg/dL (ref 0.3–1.2)
Total Protein: 6.2 g/dL — ABNORMAL LOW (ref 6.5–8.1)

## 2019-04-13 LAB — TROPONIN I (HIGH SENSITIVITY)
Troponin I (High Sensitivity): 11 ng/L (ref <18)
Troponin I (High Sensitivity): 11 ng/L (ref <18)

## 2019-04-13 LAB — LIPASE, BLOOD: Lipase: 21 U/L (ref 11–51)

## 2019-04-13 MED ORDER — FOSFOMYCIN TROMETHAMINE 3 G PO PACK
3.0000 g | PACK | Freq: Once | ORAL | Status: AC
  Administered 2019-04-13: 16:00:00 3 g via ORAL
  Filled 2019-04-13: qty 3

## 2019-04-13 MED ORDER — IOHEXOL 300 MG/ML  SOLN
75.0000 mL | Freq: Once | INTRAMUSCULAR | Status: AC | PRN
  Administered 2019-04-13: 75 mL via INTRAVENOUS

## 2019-04-13 NOTE — ED Notes (Signed)
Patient transported to CT 

## 2019-04-13 NOTE — ED Provider Notes (Signed)
[facility] EMERGENCY DEPARTMENT Provider Note   CSN: [id number] Arrival date & time: 04/13/19  0913    History   Chief Complaint Chief Complaint  Patient presents with   Abdominal Pain    HPI [name] is a 85 y.o. female.     HPI   85yo female with history of WPW, COPD, dementia, CHF, hypertension presents from [facility] for abdominal pain.  STaff reported that she had what appeared to be severe abdominal pain this AM, was hunched over and repeating that "mama's sick."  Unclear exacerbating or relieving factors.  Pt had reported chest pain to EMS on on my ROS however daughter notes that when asked about chest pain she will always say yes and notes she did not complain about this at the facility.  Nausea. No vomiting or diarrhea.  No chest pain or dyspnea. Denies symptoms at this time.   Past Medical History:  Diagnosis Date   Abnormal cardiovascular stress test 01/17/2013   Decreased LV function with mild distal anterior perfusion abnormality    Acute combined systolic and diastolic heart failure (HCC) 01/17/2013   Chronic combined systolic and diastolic CHF (congestive heart failure) (HCC) 08/17/2017   COPD (chronic obstructive pulmonary disease) (HCC)    Hypertension    Restless leg syndrome    Right lower lobe pneumonia (HCC) 08/17/2017   Sepsis (HCC) 08/17/2017   Tobacco use disorder    WPW (Wolff-Parkinson-White syndrome)     Patient Active Problem List   Diagnosis Date Noted   Influenza due to influenza virus, type B 07/12/2018   Elevated troponin 07/11/2018   COPD with acute exacerbation (HCC) 07/11/2018   Acute encephalopathy 07/11/2018   Prolonged QT interval 07/11/2018   Dementia (HCC) 10/13/2017   At risk for adverse drug event 09/26/2017   Normochromic normocytic anemia 09/26/2017   Restless leg syndrome    Protein-calorie malnutrition, severe 09/22/2017   Closed left hip fracture, initial  encounter (HCC) 09/19/2017   Pneumococcal pneumonia (HCC) 08/26/2017   Hypertension 08/26/2017   COPD (chronic obstructive pulmonary disease) (HCC) 08/26/2017   Psychosis (HCC) 08/26/2017   Coronary artery disease 08/26/2017   Sepsis (HCC) 08/17/2017   Chronic combined systolic and diastolic CHF (congestive heart failure) (HCC) 08/17/2017   Right lower lobe pneumonia (HCC) 08/17/2017   Abnormal cardiovascular stress test 01/17/2013    Class: Acute   Tobacco use disorder    WPW (Wolff-Parkinson-White syndrome)     Past Surgical History:  Procedure Laterality Date   APPENDECTOMY     BACK SURGERY     MULTIPLE   HEMANGIOMA EXCISION     HIP ARTHROPLASTY Left 09/20/2017   Procedure: ARTHROPLASTY BIPOLAR HIP (HEMIARTHROPLASTY) LEFT;  Surgeon: Ollen Gross, MD;  Location: [facility];  Service: Orthopedics;  Laterality: Left;   KIDNEY SURGERY     RT KIDNEY TACK   NECK SURGERY     TONSILLECTOMY       OB History   No obstetric history on file.      Home Medications    Prior to Admission medications   Medication Sig Start Date End Date Taking? Authorizing Provider  acetaminophen (TYLENOL) 500 MG tablet Take 500 mg by mouth every 6 (six) hours as needed for moderate pain.    [provider]  ACIDOPHILUS LACTOBACILLUS PO Take 1 tablet by mouth every Monday, Wednesday, and Friday.    [provider]  albuterol (PROVENTIL HFA;VENTOLIN HFA) 108 (90 Base) MCG/ACT inhaler Inhale 2 puffs into  the lungs every 6 (six) hours as needed for wheezing or shortness of breath.    [provider]  ALPRAZolam Prudy Feeler(XANAX) 0.25 MG tablet Take 0.25 mg by mouth as directed. Take 1 tablet BID and Take 1 tablet every 6 hours PRN for agitation.    [provider]  aspirin EC 81 MG tablet Take 81 mg by mouth daily.    [provider]  B Complex Vitamins (B COMPLEX-B12) TABS Take 1 tablet by mouth daily.    [provider]  cholecalciferol  (VITAMIN D3) 25 MCG (1000 UT) tablet Take 1,000 Units by mouth daily.    [provider]  famotidine (PEPCID) 10 MG tablet Take 10 mg by mouth every 12 (twelve) hours as needed for heartburn or indigestion.    [provider]  fentaNYL (DURAGESIC - DOSED MCG/HR) 25 MCG/HR patch Place 1 patch (25 mcg total) onto the skin every 3 (three) days. Patient not taking: Reported on 03/04/2019 09/23/17   Albertine GratesXu, Fang, MD  fentaNYL 37.5 MCG/HR PT72 Place 1 patch onto the skin every 3 (three) days.  02/20/19   [provider]  ferrous sulfate 325 (65 FE) MG tablet Take 325 mg by mouth every Monday, Wednesday, and Friday.     [provider]  fluticasone furoate-vilanterol (BREO ELLIPTA) 200-25 MCG/INH AEPB Inhale 1 puff into the lungs at bedtime.    [provider]  guaiFENesin (MUCINEX) 600 MG 12 hr tablet Take 600 mg by mouth every 12 (twelve) hours as needed for cough or to loosen phlegm.    [provider]  ipratropium-albuterol (DUONEB) 0.5-2.5 (3) MG/3ML SOLN Take 3 mLs by nebulization every 6 (six) hours as needed (shortness of breath).     [provider]  isosorbide mononitrate (IMDUR) 30 MG 24 hr tablet Take 30 mg by mouth daily.    [provider]  loratadine (CLARITIN) 10 MG tablet Take 10 mg by mouth daily.    [provider]  losartan (COZAAR) 50 MG tablet Take 50 mg by mouth daily.    [provider]  Magnesium 250 MG TABS Take 1 tablet by mouth daily.    [provider]  Melatonin 3 MG TABS Take 1 tablet by mouth at bedtime.    [provider]  methocarbamol (ROBAXIN) 500 MG tablet Take 0.5 tablets (250 mg total) by mouth at bedtime as needed. Patient taking differently: Take 250 mg by mouth at bedtime as needed for muscle spasms.  09/22/17   Perkins, Alexzandrew L, PA-C  nitroGLYCERIN (NITROSTAT) 0.4 MG SL tablet Place 1 tablet (0.4 mg total) under the tongue every 5 (five) minutes x 3 doses as  needed for chest pain. 12/22/12   Corky CraftsVaranasi, Jayadeep S, MD  ondansetron (ZOFRAN) 4 MG tablet Take 4 mg by mouth every 8 (eight) hours as needed for nausea or vomiting.    [provider]  OXYGEN Inhale 1 L into the lungs as needed (oxygen saturation 87% or below).     [provider]  pantoprazole (PROTONIX) 40 MG tablet Take 40 mg by mouth 2 (two) times daily.    [provider]  polyethylene glycol (MIRALAX / GLYCOLAX) packet Take 17 g by mouth daily. 09/22/17   Albertine GratesXu, Fang, MD  pramipexole (MIRAPEX) 0.5 MG tablet Take 0.5 mg by mouth daily as needed (restless legs).     [provider]  pramipexole (MIRAPEX) 1 MG tablet Take 1 mg by mouth at bedtime.     [provider]  QUEtiapine (SEROQUEL) 100 MG tablet Take 1 tablet (100 mg total) by mouth at bedtime. Only gives 100 mg four times a week Patient taking differently: Take 50-100 mg by mouth See admin instructions. Take 50 mg by mouth in the morning and 100 mg by mouth at bedtime 08/21/17   Alison Murrayevine, Alma M, MD  sennosides-docusate sodium (SENOKOT-S) 8.6-50 MG tablet Take 1 tablet by mouth 2 (two) times daily.     [provider]  sertraline (ZOLOFT) 25 MG tablet Take 1 tablet (25 mg total) by mouth at bedtime. Patient taking differently: Take 50 mg by mouth daily.  08/21/17   Alison Murrayevine, Alma M, MD  traMADol (ULTRAM) 50 MG tablet Take 50 mg by mouth every 6 (six) hours as needed for moderate pain.     [provider]    Family History Family History  Problem Relation Age of Onset   Stomach cancer Mother    CAD Father    Diabetes Father    Hypertension Father    Hypertension Daughter    Stroke Neg Hx     Social History Social History   Tobacco Use   Smoking status: Former Smoker   Smokeless tobacco: Never Used  Substance Use Topics   Alcohol use: No   Drug use: No     Allergies   Amoxicillin, Ciprofloxacin, Lorabid [loracarbef], Avelox [moxifloxacin],  Erythromycin, Inderal [propranolol], and Chantix [varenicline]   Review of Systems Review of Systems  Unable to perform ROS: Dementia  Constitutional: Negative for fever.  Respiratory: Negative for shortness of breath.   Cardiovascular: Positive for chest pain.  Gastrointestinal: Positive for abdominal pain. Negative for diarrhea, nausea and vomiting.     Physical Exam Updated Vital Signs BP (!) 110/91    Pulse 70    Temp 98 F (36.7 C) (Oral)    Resp 15    SpO2 (!) 89%   Physical Exam Vitals signs and nursing note reviewed.  Constitutional:      General: She is not in acute distress.    Appearance: She is well-developed. She is not diaphoretic.  HENT:     Head: Normocephalic and atraumatic.  Eyes:     Conjunctiva/sclera: Conjunctivae normal.  Neck:     Musculoskeletal: Normal range of motion.  Cardiovascular:     Rate and Rhythm: Normal rate and regular rhythm.     Heart sounds: Normal heart sounds. No murmur. No friction rub. No gallop.   Pulmonary:     Effort: Pulmonary effort is normal. No respiratory distress.     Breath sounds: Normal breath sounds. No wheezing or rales.  Abdominal:     General: There is no distension.     Palpations: Abdomen is soft.     Tenderness: There is abdominal tenderness in the suprapubic area and left lower quadrant. There is no guarding.  Musculoskeletal:        General: No tenderness.  Skin:    General: Skin is warm and dry.     Findings: No erythema or rash.  Neurological:     Mental Status: She is alert.     Comments: Oriented to self       ED Treatments / Results  Labs (all labs ordered are listed, but only abnormal results are displayed) Labs Reviewed  CBC WITH DIFFERENTIAL/PLATELET - Abnormal; Notable for the following components:      Result Value   RBC 3.83 (*)    Hemoglobin 11.7 (*)    All other components  within normal limits  COMPREHENSIVE METABOLIC PANEL - Abnormal; Notable for the following components:    Creatinine, Ser 1.05 (*)    Total Protein 6.2 (*)    GFR calc non Af Amer 47 (*)    GFR calc Af Amer 55 (*)    All other components within normal limits  URINALYSIS, ROUTINE W REFLEX MICROSCOPIC - Abnormal; Notable for the following components:   APPearance HAZY (*)    Leukocytes,Ua TRACE (*)    Bacteria, UA RARE (*)    All other components within normal limits  URINE CULTURE  LIPASE, BLOOD  TROPONIN I (HIGH SENSITIVITY)  TROPONIN I (HIGH SENSITIVITY)    EKG EKG Interpretation  Date/Time:  Saturday April 13 2019 09:25:49 EDT Ventricular Rate:  71 PR Interval:  174 QRS Duration: 140 QT Interval:  496 QTC Calculation: 538 R Axis:   69 Text Interpretation:  Normal sinus rhythm Non-specific intra-ventricular conduction block T wave abnormality, consider inferior ischemia Abnormal ECG No significant change since last tracing Confirmed by Gareth Morgan [phone]) on 04/13/2019 9:32:07 AM   Radiology Dg Chest 2 View  Result Date: 04/13/2019 CLINICAL DATA:  Chest and abdominal pain beginning today. COPD. EXAM: CHEST - 2 VIEW COMPARISON:  03/18/2019 FINDINGS: Stable cardiomegaly.  Aortic atherosclerosis. Marked pulmonary hyperinflation again seen, consistent with COPD. No evidence of pulmonary infiltrate or edema. Probable mild bibasilar pleural thickening again seen posteriorly. Neurostimulator leads are again seen in the midthoracic spinal canal. An old lower thoracic vertebral body compression deformity is also unchanged since previous exam. Anterior cervical spine fusion hardware also noted. IMPRESSION: Stable cardiomegaly and COPD. No active lung disease. Electronically Signed   By: Marlaine Hind M.D.   On: 04/13/2019 10:54   Ct Abdomen Pelvis W Contrast  Result Date: 04/13/2019 CLINICAL DATA:  85 year old female with abdominal pain. Possible diverticulitis. EXAM: CT ABDOMEN AND PELVIS WITH CONTRAST TECHNIQUE: Multidetector CT imaging of the abdomen and pelvis was performed using the  standard protocol following bolus administration of intravenous contrast. CONTRAST:  79mL OMNIPAQUE IOHEXOL 300 MG/ML  SOLN COMPARISON:  None. FINDINGS: Lower chest: Elevation of the bilateral hemidiaphragms. Associated focal atelectasis. Visualized heart is normal in size. Unremarkable distal aorta. Hepatobiliary: No discrete hepatic lesion. Small stones layer within the gallbladder lumen. No gallbladder wall thickening or pericholecystic fluid. Minimal intrahepatic biliary ductal prominence. No extrahepatic biliary duct dilatation. Pancreas: Unremarkable. No pancreatic ductal dilatation or surrounding inflammatory changes. Spleen: Normal in size without focal abnormality. Adrenals/Urinary Tract: Unremarkable adrenal glands. No hydronephrosis, nephrolithiasis or enhancing renal mass. The ureters and bladder are unremarkable. Stomach/Bowel: Slightly limited evaluation of the anatomic pelvis secondary to streak artifact from left hip prosthesis. No significant diverticulosis or evidence of diverticulitis. No focal bowel wall thickening or evidence of obstruction. Vascular/Lymphatic: Extensive calcifications present along the abdominal aorta. No evidence of aneurysm. No suspicious lymphadenopathy. Reproductive: Uterus and bilateral adnexa are unremarkable. Other: No abdominal wall hernia or abnormality. No abdominopelvic ascites. Musculoskeletal: No acute fracture or aggressive appearing lytic or blastic osseous lesion. Left total hip arthroplasty prosthesis. No visible complication. Surgical changes of prior L3 and L4 laminectomies. Multilevel degenerative disc disease. IMPRESSION: 1. Cholelithiasis without evidence of acute cholecystitis. 2. Mild intrahepatic biliary ductal dilatation without extrahepatic biliary ductal dilatation. Findings are nonspecific but likely within the spectrum of normal. Recommend correlation with serum LFTs and bilirubin. 3. Aortic Atherosclerosis (ICD10-170.0). 4. Lumbar degenerative  disc disease with evidence of prior L3 and L4 laminectomies. 5. Left total hip arthroplasty. Electronically Signed  By: Malachy MoanHeath  McCullough M.D.   On: 04/13/2019 12:34    Procedures Procedures (including critical care time)  Medications Ordered in ED Medications  iohexol (OMNIPAQUE) 300 MG/ML solution 75 mL (75 mLs Intravenous Contrast Given 04/13/19 1204)  fosfomycin (MONUROL) packet 3 g (3 g Oral Given 04/13/19 1549)     Initial Impression / Assessment and Plan / ED Course  I have reviewed the triage vital signs and the nursing notes.  Pertinent labs & imaging results that were available during my care of the patient were reviewed by me and considered in my medical decision making (see chart for details).        85yo female with history of WPW, COPD, dementia, CHF, hypertension presents from [facility] for abdominal pain.  Pt reported CP on ROS with EMS on my history which daughter reports is not unsual. EKG without significant findings and troponin without significant elevation nor change and doubt ACS. CXR without pneumonia or pneumothorax. CT abdomen pelvis with cholelithiasis without other acute abnormalities. Suspect CBD widening related to age as no pain at this time, no abnormal liver labs. UA with questionable infection, given fosfomycin.    Discussed symptoms may be due to symptomatic cholelithiasis although history limited to tell if she is specifically having pain after eating.  Recommend follow up with PCP, consider General Surgery follow up if has episodes of pain after eating.  No sign of cholecystitis at this time. Patient discharged in stable condition with understanding of reasons to return.     Final Clinical Impressions(s) / ED Diagnoses   Final diagnoses:  Abdominal pain, unspecified abdominal location  Calculus of gallbladder without cholecystitis without obstruction    ED Discharge Orders    None       Alvira MondaySchlossman, Staceyann Knouff, MD 04/14/19 2251

## 2019-04-13 NOTE — ED Notes (Signed)
Patient verbalizes understanding of discharge instructions . Opportunity for questions and answers were provided . Armband removed by staff ,Pt discharged from ED. W/C  offered at D/C  and Declined W/C at D/C and was escorted to lobby by RN.  

## 2019-04-13 NOTE — ED Triage Notes (Signed)
Pt transported from [facility]  In [facility] . Staff reported Pt reported ABD pain this AM . EMS reported Pt reported CP to them during Transport to ED. Pt A/O x1 is [facility] [facility]

## 2019-04-13 NOTE — ED Notes (Signed)
Patient transported to X-ray 

## 2019-04-16 LAB — URINE CULTURE: Culture: >=100000 — AB

## 2019-04-17 ENCOUNTER — Telehealth: Payer: Self-pay | Admitting: Emergency Medicine

## 2019-04-17 NOTE — Telephone Encounter (Signed)
Post ED Visit - Positive Culture Follow-up  Culture report reviewed by antimicrobial stewardship pharmacist: [place] Team []  Nathan Batchelder, Pharm.D. []  [address], Pharm.D., BCPS AQ-ID []  Heide Guile, Pharm.D., BCPS []  Parks Neptune, Pharm.D., BCPS []  [place], [place].D., BCPS, AAHIVP []  [place], Pharm.D., BCPS, AAHIVP []  Legrand Como, PharmD, BCPS []  Salome Arnt, PharmD, BCPS []  Johnnette Gourd, PharmD, BCPS []  Hughes Better, PharmD []  Leeroy Cha, PharmD, BCPS []  Laqueta Linden, PharmD Albertina Parr PharmD  [place] Team []  [address], PharmD []  Leodis Sias, PharmD []  Lindell Spar, PharmD []  Royetta Asal, Rph []  Graylin Shiver) Rema Fendt, PharmD []  Glennon Mac, PharmD []  Arlyn Dunning, PharmD []  Netta Cedars, PharmD []  Dia Sitter, PharmD []  Leone Haven, PharmD []  Gretta Arab, PharmD []  Theodis Shove, PharmD []  Peggyann Juba, PharmD   Positive urine culture Treated with fosfomycin, organism sensitive to the same and no further patient follow-up is required at this time.  Reuel Boom 04/17/2019, 11:56 AM

## 2019-04-23 ENCOUNTER — Emergency Department (HOSPITAL_COMMUNITY)

## 2019-04-23 ENCOUNTER — Emergency Department (HOSPITAL_COMMUNITY)
Admission: EM | Admit: 2019-04-23 | Discharge: 2019-04-23 | Disposition: A | Discharge status: Home / Self Care | Attending: Emergency Medicine | Admitting: Emergency Medicine

## 2019-04-23 DIAGNOSIS — I110 Hypertensive heart disease with heart failure: Secondary | ICD-10-CM | POA: Diagnosis not present

## 2019-04-23 DIAGNOSIS — Y999 Unspecified external cause status: Secondary | ICD-10-CM | POA: Insufficient documentation

## 2019-04-23 DIAGNOSIS — Z96642 Presence of left artificial hip joint: Secondary | ICD-10-CM | POA: Insufficient documentation

## 2019-04-23 DIAGNOSIS — Z7982 Long term (current) use of aspirin: Secondary | ICD-10-CM | POA: Insufficient documentation

## 2019-04-23 DIAGNOSIS — F0390 Unspecified dementia without behavioral disturbance: Secondary | ICD-10-CM | POA: Insufficient documentation

## 2019-04-23 DIAGNOSIS — S0101XA Laceration without foreign body of scalp, initial encounter: Secondary | ICD-10-CM | POA: Diagnosis not present

## 2019-04-23 DIAGNOSIS — Z79899 Other long term (current) drug therapy: Secondary | ICD-10-CM | POA: Diagnosis not present

## 2019-04-23 DIAGNOSIS — Y939 Activity, unspecified: Secondary | ICD-10-CM | POA: Insufficient documentation

## 2019-04-23 DIAGNOSIS — W19XXXA Unspecified fall, initial encounter: Secondary | ICD-10-CM

## 2019-04-23 DIAGNOSIS — Y92129 Unspecified place in nursing home as the place of occurrence of the external cause: Secondary | ICD-10-CM | POA: Insufficient documentation

## 2019-04-23 DIAGNOSIS — J449 Chronic obstructive pulmonary disease, unspecified: Secondary | ICD-10-CM | POA: Insufficient documentation

## 2019-04-23 DIAGNOSIS — W07XXXA Fall from chair, initial encounter: Secondary | ICD-10-CM | POA: Diagnosis not present

## 2019-04-23 DIAGNOSIS — I5042 Chronic combined systolic (congestive) and diastolic (congestive) heart failure: Secondary | ICD-10-CM | POA: Insufficient documentation

## 2019-04-23 MED ORDER — LIDOCAINE-EPINEPHRINE-TETRACAINE (LET) SOLUTION
3.0000 mL | Freq: Once | NASAL | Status: AC
  Administered 2019-04-23: 14:00:00 3 mL via TOPICAL
  Filled 2019-04-23: qty 3

## 2019-04-23 MED ORDER — LIDOCAINE HCL (PF) 1 % IJ SOLN
5.0000 mL | Freq: Once | INTRAMUSCULAR | Status: AC
  Administered 2019-04-23: 5 mL
  Filled 2019-04-23: qty 5

## 2019-04-23 NOTE — ED Notes (Signed)
pts daughter Carolyn Stare [phone]

## 2019-04-23 NOTE — ED Provider Notes (Signed)
  Face-to-face evaluation   History: She presents for evaluation of injury from fall, not witnessed.  Patient has complained of a headache.  She is being managed in the memory care unit at this time.  Physical exam: She is alert, cooperative.  Her daughter is with her now and states she is at her baseline.  No dysarthria or aphasia.  He is confused.  Left parietal scalp with superficial laceration, gait not bleeding.  No associated crepitation.  Normal grip strength bilateral hands.  Medical screening examination/treatment/procedure(s) were conducted as a shared visit with non-physician practitioner(s) and myself.  I personally evaluated the patient during the encounter    Daleen Bo, MD 04/25/19 1745

## 2019-04-23 NOTE — ED Triage Notes (Signed)
Pt arrives from [facility] with reports of unwitnessed fall. Pt not on blood thinners. Laceration on top of head. Bleeding controlled. Pt endorses headache during triage. Unknown if Pt had LOC

## 2019-04-23 NOTE — ED Notes (Signed)
Patient transported to X-ray 

## 2019-04-23 NOTE — ED Provider Notes (Signed)
[facility] EMERGENCY DEPARTMENT Provider Note   CSN: [id number] Arrival date & time: 04/23/19  1209     History   Chief Complaint No chief complaint on file.   HPI [name] is a 85 y.o. female with history of dementia currently resides in a memory care unit, COPD, hypertension, CAD presents today after unwitnessed fall with left parietal scalp laceration.  Per patient she reports she was sitting in her chair when she fell backwards hitting her head on the ground.  Story differs per EMS and nursing staff.  Fall was unwitnessed.  Patient reports a mild left parietal headache after her fall throbbing sensation around her laceration, worsened with palpation and improved with not touching the area.  She denies any other pain today.  Patient arrives in cervical collar via EMS. - No blood thinner use per EMS report and patient's daughter who is now at bedside.  Patient's daughter reports patient is at baseline mental status.  No known recent infectious-like symptoms per daughter.    HPI  Past Medical History:  Diagnosis Date   Abnormal cardiovascular stress test 01/17/2013   Decreased LV function with mild distal anterior perfusion abnormality    Acute combined systolic and diastolic heart failure (HCC) 01/17/2013   Chronic combined systolic and diastolic CHF (congestive heart failure) (HCC) 08/17/2017   COPD (chronic obstructive pulmonary disease) (HCC)    Hypertension    Restless leg syndrome    Right lower lobe pneumonia (HCC) 08/17/2017   Sepsis (HCC) 08/17/2017   Tobacco use disorder    WPW (Wolff-Parkinson-White syndrome)     Patient Active Problem List   Diagnosis Date Noted   Influenza due to influenza virus, type B 07/12/2018   Elevated troponin 07/11/2018   COPD with acute exacerbation (HCC) 07/11/2018   Acute encephalopathy 07/11/2018   Prolonged QT interval 07/11/2018   Dementia (HCC) 10/13/2017   At risk for adverse drug  event 09/26/2017   Normochromic normocytic anemia 09/26/2017   Restless leg syndrome    Protein-calorie malnutrition, severe 09/22/2017   Closed left hip fracture, initial encounter (HCC) 09/19/2017   Pneumococcal pneumonia (HCC) 08/26/2017   Hypertension 08/26/2017   COPD (chronic obstructive pulmonary disease) (HCC) 08/26/2017   Psychosis (HCC) 08/26/2017   Coronary artery disease 08/26/2017   Sepsis (HCC) 08/17/2017   Chronic combined systolic and diastolic CHF (congestive heart failure) (HCC) 08/17/2017   Right lower lobe pneumonia (HCC) 08/17/2017   Abnormal cardiovascular stress test 01/17/2013    Class: Acute   Tobacco use disorder    WPW (Wolff-Parkinson-White syndrome)     Past Surgical History:  Procedure Laterality Date   APPENDECTOMY     BACK SURGERY     MULTIPLE   HEMANGIOMA EXCISION     HIP ARTHROPLASTY Left 09/20/2017   Procedure: ARTHROPLASTY BIPOLAR HIP (HEMIARTHROPLASTY) LEFT;  Surgeon: Ollen Gross, MD;  Location: [facility];  Service: Orthopedics;  Laterality: Left;   KIDNEY SURGERY     RT KIDNEY TACK   NECK SURGERY     TONSILLECTOMY       OB History   No obstetric history on file.      Home Medications    Prior to Admission medications   Medication Sig Start Date End Date Taking? Authorizing Provider  acetaminophen (TYLENOL) 500 MG tablet Take 500 mg by mouth every 6 (six) hours as needed for moderate pain.    [provider]  ACIDOPHILUS LACTOBACILLUS PO Take 1 tablet by mouth every Monday, Wednesday,  and Friday.    [provider]  albuterol (PROVENTIL HFA;VENTOLIN HFA) 108 (90 Base) MCG/ACT inhaler Inhale 2 puffs into the lungs every 6 (six) hours as needed for wheezing or shortness of breath.    [provider]  ALPRAZolam Prudy Feeler(XANAX) 0.25 MG tablet Take 0.25 mg by mouth as directed. Take 1 tablet BID and Take 1 tablet every 6 hours PRN for agitation.    [provider]  aspirin EC 81 MG tablet  Take 81 mg by mouth daily.    [provider]  B Complex Vitamins (B COMPLEX-B12) TABS Take 1 tablet by mouth daily.    [provider]  cholecalciferol (VITAMIN D3) 25 MCG (1000 UT) tablet Take 1,000 Units by mouth daily.    [provider]  famotidine (PEPCID) 10 MG tablet Take 10 mg by mouth every 12 (twelve) hours as needed for heartburn or indigestion.    [provider]  fentaNYL (DURAGESIC - DOSED MCG/HR) 25 MCG/HR patch Place 1 patch (25 mcg total) onto the skin every 3 (three) days. Patient not taking: Reported on 03/04/2019 09/23/17   Albertine GratesXu, Fang, MD  fentaNYL 37.5 MCG/HR PT72 Place 1 patch onto the skin every 3 (three) days.  02/20/19   [provider]  ferrous sulfate 325 (65 FE) MG tablet Take 325 mg by mouth every Monday, Wednesday, and Friday.     [provider]  fluticasone furoate-vilanterol (BREO ELLIPTA) 200-25 MCG/INH AEPB Inhale 1 puff into the lungs at bedtime.    [provider]  guaiFENesin (MUCINEX) 600 MG 12 hr tablet Take 600 mg by mouth every 12 (twelve) hours as needed for cough or to loosen phlegm.    [provider]  ipratropium-albuterol (DUONEB) 0.5-2.5 (3) MG/3ML SOLN Take 3 mLs by nebulization every 6 (six) hours as needed (shortness of breath).     [provider]  isosorbide mononitrate (IMDUR) 30 MG 24 hr tablet Take 30 mg by mouth daily.    [provider]  loratadine (CLARITIN) 10 MG tablet Take 10 mg by mouth daily.    [provider]  losartan (COZAAR) 50 MG tablet Take 50 mg by mouth daily.    [provider]  Magnesium 250 MG TABS Take 1 tablet by mouth daily.    [provider]  Melatonin 3 MG TABS Take 1 tablet by mouth at bedtime.    [provider]  methocarbamol (ROBAXIN) 500 MG tablet Take 0.5 tablets (250 mg total) by mouth at bedtime as needed. Patient taking differently: Take 250 mg by mouth at bedtime as needed for muscle  spasms.  09/22/17   Perkins, Alexzandrew L, PA-C  nitroGLYCERIN (NITROSTAT) 0.4 MG SL tablet Place 1 tablet (0.4 mg total) under the tongue every 5 (five) minutes x 3 doses as needed for chest pain. 12/22/12   Corky CraftsVaranasi, Jayadeep S, MD  ondansetron (ZOFRAN) 4 MG tablet Take 4 mg by mouth every 8 (eight) hours as needed for nausea or vomiting.    [provider]  OXYGEN Inhale 1 L into the lungs as needed (oxygen saturation 87% or below).     [provider]  pantoprazole (PROTONIX) 40 MG tablet Take 40 mg by mouth 2 (two) times daily.    [provider]  polyethylene glycol (MIRALAX / GLYCOLAX) packet Take 17 g by mouth daily. 09/22/17   Albertine GratesXu, Fang, MD  pramipexole (MIRAPEX) 0.5 MG tablet Take 0.5 mg by mouth daily as needed (restless legs).  [provider]  pramipexole (MIRAPEX) 1 MG tablet Take 1 mg by mouth at bedtime.     [provider]  QUEtiapine (SEROQUEL) 100 MG tablet Take 1 tablet (100 mg total) by mouth at bedtime. Only gives 100 mg four times a week Patient taking differently: Take 50-100 mg by mouth See admin instructions. Take 50 mg by mouth in the morning and 100 mg by mouth at bedtime 08/21/17   Alison Murray, MD  sennosides-docusate sodium (SENOKOT-S) 8.6-50 MG tablet Take 1 tablet by mouth 2 (two) times daily.     [provider]  sertraline (ZOLOFT) 25 MG tablet Take 1 tablet (25 mg total) by mouth at bedtime. Patient taking differently: Take 50 mg by mouth daily.  08/21/17   Alison Murray, MD  traMADol (ULTRAM) 50 MG tablet Take 50 mg by mouth every 6 (six) hours as needed for moderate pain.     [provider]    Family History Family History  Problem Relation Age of Onset   Stomach cancer Mother    CAD Father    Diabetes Father    Hypertension Father    Hypertension Daughter    Stroke Neg Hx     Social History Social History   Tobacco Use   Smoking status: Former Smoker   Smokeless tobacco:  Never Used  Substance Use Topics   Alcohol use: No   Drug use: No     Allergies   Amoxicillin, Ciprofloxacin, Lorabid [loracarbef], Avelox [moxifloxacin], Erythromycin, Inderal [propranolol], and Chantix [varenicline]   Review of Systems Review of Systems  Unable to perform ROS: Dementia     Physical Exam Updated Vital Signs BP 135/86    Pulse 74    Temp 97.8 F (36.6 C) (Axillary)    Resp 15    SpO2 96%   Physical Exam Exam conducted with a chaperone present Gwenyth Bender.).  Constitutional:      General: She is not in acute distress.    Appearance: Normal appearance. She is well-developed. She is not ill-appearing or diaphoretic.  HENT:     Head: Normocephalic. Laceration present. No raccoon eyes or Battle's sign.     Jaw: There is normal jaw occlusion. No trismus.      Comments: 3 cm laceration    Right Ear: External ear normal.     Left Ear: External ear normal.     Nose: Nose normal. No rhinorrhea.     Right Nostril: No epistaxis.     Left Nostril: No epistaxis.     Mouth/Throat:     Mouth: Mucous membranes are moist.     Pharynx: Oropharynx is clear.  Eyes:     General: Vision grossly intact. Gaze aligned appropriately.     Extraocular Movements: Extraocular movements intact.     Pupils: Pupils are equal, round, and reactive to light.  Neck:     Musculoskeletal: Full passive range of motion without pain, normal range of motion and neck supple. No spinous process tenderness or muscular tenderness.     Trachea: Trachea and phonation normal. No tracheal tenderness or tracheal deviation.     Comments: C-collar removed after negative CT cervical spine study, normal ROM without pain. Cardiovascular:     Rate and Rhythm: Normal rate and regular rhythm.     Pulses:          Dorsalis pedis pulses are 2+ on the right side and 2+ on the left side.  Pulmonary:     Effort:  Pulmonary effort is normal. No accessory muscle usage or respiratory distress.     Breath sounds:  Normal breath sounds and air entry. No decreased breath sounds.  Chest:     Chest wall: No deformity, tenderness or crepitus.     Comments: No sign of injury of the chest Abdominal:     General: There is no distension.     Palpations: Abdomen is soft.     Tenderness: There is no abdominal tenderness. There is no guarding or rebound.     Comments: No sign of injury of the abdomen  Musculoskeletal: Normal range of motion.     Comments: No midline C/T/L spinal tenderness to palpation, no paraspinal muscle tenderness, no deformity, crepitus, or step-off noted. No sign of injury to the neck or back. - No pain with palpation of the hips.  Patient is able to actively bring bilateral knees to chest without pain. - All major joints palpated and brought through range of motion without pain or crepitus.  Feet:     Right foot:     Protective Sensation: 3 sites tested. 3 sites sensed.     Left foot:     Protective Sensation: 3 sites tested. 3 sites sensed.  Skin:    General: Skin is warm and dry.  Neurological:     Mental Status: She is alert.     GCS: GCS eye subscore is 4. GCS verbal subscore is 5. GCS motor subscore is 6.     Comments: Speech is clear and goal oriented, follows commands Major Cranial nerves without deficit, no facial droop Normal strength in upper and lower extremities bilaterally including dorsiflexion and plantar flexion, strong and equal grip strength Sensation normal to light and sharp touch Moves extremities without ataxia, coordination intact  Psychiatric:        Behavior: Behavior normal.    ED Treatments / Results  Labs (all labs ordered are listed, but only abnormal results are displayed) Labs Reviewed - No data to display  EKG None  Radiology Dg Pelvis 1-2 Views  Result Date: 04/23/2019 CLINICAL DATA:  Fall, pain EXAM: PELVIS - 1-2 VIEW COMPARISON:  09/20/2017 FINDINGS: Partially visualized left total hip arthroplasty. No periprosthetic lucency or  fracture visualized. Right hip is intact without fracture or dislocation. SI joints and pubic symphysis are intact without diastasis. Bones are demineralized. No acute fracture identified. Aortoiliac atherosclerotic calcifications noted. IMPRESSION: No acute fracture or malalignment. Electronically Signed   By: Duanne Guess M.D.   On: 04/23/2019 13:20   Ct Head Wo Contrast  Addendum Date: 04/23/2019   ADDENDUM REPORT: 04/23/2019 14:30 ADDENDUM: CT cervical spine findings: Alignment: 4 mm anterolisthesis C7 on T1, unchanged.  The Skull base and vertebrae: C4-C7 ACDF. There is a fracture of the right C4 screw, unchanged. The hardware otherwise remains intact without evidence of loosening. Partial osseous fusion of C3-C5. No acute fractures. Soft tissues and spinal canal: No prevertebral fluid or swelling. No visible canal hematoma. Disc levels: Similar multilevel degenerative changes of the cervical spine, not significantly progressed compared to prior. No high-grade canal stenosis. Upper chest: Centrilobular emphysema. Atherosclerotic calcifications of the aortic arch. No pneumothorax. Other: None. IMPRESSION: 1. No acute fracture of the cervical spine. 2. Status post C4-C7 ACDF with grade 1 anterolisthesis C7 on T1, unchanged. 3. Similar multilevel degenerative changes. Electronically Signed   By: Duanne Guess M.D.   On: 04/23/2019 14:30   Result Date: 04/23/2019 CLINICAL DATA:  Fall, head trauma EXAM: CT HEAD WITHOUT CONTRAST;  CT CERVICAL SPINE WITHOUT CONTRAST TECHNIQUE: Contiguous axial images were obtained from the base of the skull through the vertex without intravenous contrast. COMPARISON:  03/04/2019 FINDINGS: Brain: No evidence of acute infarction, hemorrhage, hydrocephalus, extra-axial collection or mass lesion/mass effect. Low-density changes within the periventricular and subcortical white matter compatible with chronic microvascular ischemic changes. There is mild diffuse cerebral volume  loss. Vascular: Mild atherosclerotic calcifications involving the large vessels of the skull base. No unexpected hyperdense vessel. Skull: Normal. Negative for fracture or focal lesion. Sinuses/Orbits: Minimal mucosal debris within the left maxillary sinus. The paranasal sinuses and mastoid air cells are otherwise clear. Orbital structures intact. Other: Small superficial hematoma overlying the left high parietal region. IMPRESSION: 1. No acute intracranial findings. 2. Small superficial scalp hematoma. 3. Chronic microvascular ischemic changes and mild cerebral volume loss, stable from prior. Electronically Signed: By: Duanne Guess M.D. On: 04/23/2019 13:52   Ct Cervical Spine Wo Contrast  Addendum Date: 04/23/2019   ADDENDUM REPORT: 04/23/2019 14:30 ADDENDUM: CT cervical spine findings: Alignment: 4 mm anterolisthesis C7 on T1, unchanged.  The Skull base and vertebrae: C4-C7 ACDF. There is a fracture of the right C4 screw, unchanged. The hardware otherwise remains intact without evidence of loosening. Partial osseous fusion of C3-C5. No acute fractures. Soft tissues and spinal canal: No prevertebral fluid or swelling. No visible canal hematoma. Disc levels: Similar multilevel degenerative changes of the cervical spine, not significantly progressed compared to prior. No high-grade canal stenosis. Upper chest: Centrilobular emphysema. Atherosclerotic calcifications of the aortic arch. No pneumothorax. Other: None. IMPRESSION: 1. No acute fracture of the cervical spine. 2. Status post C4-C7 ACDF with grade 1 anterolisthesis C7 on T1, unchanged. 3. Similar multilevel degenerative changes. Electronically Signed   By: Duanne Guess M.D.   On: 04/23/2019 14:30   Result Date: 04/23/2019 CLINICAL DATA:  Fall, head trauma EXAM: CT HEAD WITHOUT CONTRAST; CT CERVICAL SPINE WITHOUT CONTRAST TECHNIQUE: Contiguous axial images were obtained from the base of the skull through the vertex without intravenous contrast.  COMPARISON:  03/04/2019 FINDINGS: Brain: No evidence of acute infarction, hemorrhage, hydrocephalus, extra-axial collection or mass lesion/mass effect. Low-density changes within the periventricular and subcortical white matter compatible with chronic microvascular ischemic changes. There is mild diffuse cerebral volume loss. Vascular: Mild atherosclerotic calcifications involving the large vessels of the skull base. No unexpected hyperdense vessel. Skull: Normal. Negative for fracture or focal lesion. Sinuses/Orbits: Minimal mucosal debris within the left maxillary sinus. The paranasal sinuses and mastoid air cells are otherwise clear. Orbital structures intact. Other: Small superficial hematoma overlying the left high parietal region. IMPRESSION: 1. No acute intracranial findings. 2. Small superficial scalp hematoma. 3. Chronic microvascular ischemic changes and mild cerebral volume loss, stable from prior. Electronically Signed: By: Duanne Guess M.D. On: 04/23/2019 13:52    Procedures .Marland KitchenLaceration Repair  Date/Time: 04/23/2019 2:28 PM Performed by: Bill Salinas, PA-C Authorized by: Bill Salinas, PA-C   Consent:    Consent obtained:  Verbal   Consent given by:  Patient and guardian (Daugther at bedside)   Risks discussed:  Vascular damage, tendon damage, retained foreign body, poor wound healing, poor cosmetic result, pain, nerve damage, need for additional repair and infection Anesthesia (see MAR for exact dosages):    Anesthesia method:  Topical application and local infiltration   Topical anesthetic:  LET   Local anesthetic:  Lidocaine 1% w/o epi Laceration details:    Location:  Scalp   Scalp location:  L parietal   Length (  cm):  3   Depth (mm):  4 Repair type:    Repair type:  Simple Pre-procedure details:    Preparation:  Patient was prepped and draped in usual sterile fashion and imaging obtained to evaluate for foreign bodies Exploration:    Hemostasis achieved  with:  LET and direct pressure   Wound exploration: wound explored through full range of motion and entire depth of wound probed and visualized     Wound extent: no foreign bodies/material noted, no muscle damage noted, no nerve damage noted, no tendon damage noted, no underlying fracture noted and no vascular damage noted     Contaminated: no   Treatment:    Area cleansed with:  Betadine, saline and Shur-Clens   Amount of cleaning:  Standard   Irrigation method:  Pressure wash Skin repair:    Repair method:  Staples   Number of staples:  5 Approximation:    Approximation:  Close Post-procedure details:    Dressing:  Antibiotic ointment, non-adherent dressing and sterile dressing   Patient tolerance of procedure:  Tolerated well, no immediate complications Comments:     Dressing applied by nursing staff.   (including critical care time)  Medications Ordered in ED Medications  lidocaine (PF) (XYLOCAINE) 1 % injection 5 mL (has no administration in time range)  lidocaine-EPINEPHrine-tetracaine (LET) solution (3 mLs Topical Given 04/23/19 1338)     Initial Impression / Assessment and Plan / ED Course  I have reviewed the triage vital signs and the nursing notes.  Pertinent labs & imaging results that were available during my care of the patient were reviewed by me and considered in my medical decision making (see chart for details).    Screening DG Pelvis:  IMPRESSION:  No acute fracture or malalignment.   CT Head:  IMPRESSION:  1. No acute intracranial findings.  2. Small superficial scalp hematoma.  3. Chronic microvascular ischemic changes and mild cerebral volume  loss, stable from prior.   CT Cervical Spine: IMPRESSION: 1. No acute fracture of the cervical spine. 2. Status post C4-C7 ACDF with grade 1 anterolisthesis C7 on T1, unchanged. 3. Similar multilevel degenerative changes. ------------------------ Patient and daughter updated on findings today and they  state understanding.  Reviewed patient's tetanus status with her daughter, she was able to show me on [facility] that her tetanus shot was up-to-date in August 2017. No indication for additional tetanus at this time. - Wound thoroughly cleaned in ED today. Wound explored and bottom of wound seen in a bloodless field. Laceration repaired as dictated above.  No indication for antibiotics at this time. Patient and daughter counseled on home wound care. Follow up with PCP/urgent care or return to ER for suture removal in 7-14 days. Patient was urged to return to the Emergency Department for worsening pain, swelling, expanding erythema especially if it streaks away from the affected area, fever, or for any additional concerns.  At this time there does not appear to be any evidence of an acute emergency medical condition and the patient appears stable for discharge with appropriate outpatient follow up. Diagnosis was discussed with patient and daughter who verbalizes understanding of care plan and is agreeable to discharge. I have discussed return precautions with patient and daughter who verbalizes understanding of return precautions. Patient encouraged to follow-up with their PCP. All questions answered.  Patient seen and evaluated by Dr. Effie ShyWentz during this visit who agrees with work-up and discharge at this time.  Note: Portions  of this report may have been transcribed using voice recognition software. Every effort was made to ensure accuracy; however, inadvertent computerized transcription errors may still be present. Final Clinical Impressions(s) / ED Diagnoses   Final diagnoses:  Fall, initial encounter  Laceration of scalp, initial encounter    ED Discharge Orders    None       Gari Crown 04/23/19 1525    Daleen Bo, MD 04/25/19 1744

## 2019-04-23 NOTE — ED Notes (Signed)
ED Provider at bedside. 

## 2019-04-23 NOTE — Discharge Instructions (Signed)
You have been diagnosed today with fall and scalp laceration.  At this time there does not appear to be the presence of an emergent medical condition, however there is always the potential for conditions to change. Please read and follow the below instructions.  Please return to the Emergency Department immediately for any new or worsening symptoms. Please be sure to follow up with your Primary Care Provider within one week regarding your visit today; please call their office to schedule an appointment even if you are feeling better for a follow-up visit. Please wash the laceration gently with clean soap and water daily.  Change the bandage daily and monitor for signs of infection.  Seek medical attention if signs of infection occur including redness, swelling, pain, fever, drainage, opening of the cath or any new/concerning symptoms. The staples will need to be removed and 7-14 days, they may be removed by your primary care provider, in urgent care or if needed here at the emergency department.  Get help right away if: You have very bad swelling around your wound. You have pus or a bad smell coming from your wound. Your pain suddenly gets worse and is very bad. You have painful lumps near your wound or anywhere on your body. You have a red streak going away from your wound. The wound is on your hand or foot, and: You cannot move a finger or toe as you used to do. Your fingers or toes look pale or blue. You have numbness that spreads down your hand, foot, fingers, or toes. Any new/concerning symptoms  Please read the additional information packets attached to your discharge summary.

## 2019-07-22 ENCOUNTER — Ambulatory Visit: Payer: Medicare Other | Admitting: Podiatry

## 2019-08-08 ENCOUNTER — Ambulatory Visit: Payer: Medicare Other | Admitting: Podiatry

## 2019-08-26 ENCOUNTER — Ambulatory Visit: Payer: Medicare Other | Admitting: Podiatry

## 2019-09-02 ENCOUNTER — Ambulatory Visit: Payer: Medicare Other | Admitting: Podiatry

## 2019-09-30 DEATH — deceased
